# Patient Record
Sex: Male | Born: 1960 | Race: Asian | Hispanic: No | Marital: Single | Smoking: Former smoker
Health system: Southern US, Community
[De-identification: ages and names within clinical notes are randomized; demographics above are authoritative.]

## PROBLEM LIST (undated history)

## (undated) DIAGNOSIS — I10 Essential (primary) hypertension: Secondary | ICD-10-CM

## (undated) DIAGNOSIS — I639 Cerebral infarction, unspecified: Secondary | ICD-10-CM

## (undated) DIAGNOSIS — R350 Frequency of micturition: Secondary | ICD-10-CM

## (undated) HISTORY — PX: NO PAST SURGERIES: SHX2092

---

## 2015-12-03 ENCOUNTER — Other Ambulatory Visit: Payer: Self-pay | Admitting: Internal Medicine

## 2015-12-03 DIAGNOSIS — R471 Dysarthria and anarthria: Secondary | ICD-10-CM

## 2015-12-15 ENCOUNTER — Ambulatory Visit
Admission: RE | Admit: 2015-12-15 | Discharge: 2015-12-15 | Disposition: A | Discharge status: Home / Self Care | Payer: BLUE CROSS/BLUE SHIELD | Source: Ambulatory Visit | Attending: Internal Medicine | Admitting: Internal Medicine

## 2015-12-15 DIAGNOSIS — R471 Dysarthria and anarthria: Secondary | ICD-10-CM

## 2015-12-24 ENCOUNTER — Other Ambulatory Visit: Payer: Self-pay | Admitting: Gastroenterology

## 2015-12-28 ENCOUNTER — Other Ambulatory Visit: Payer: Self-pay | Admitting: Gastroenterology

## 2016-01-25 ENCOUNTER — Encounter (HOSPITAL_COMMUNITY): Admission: RE | Payer: Self-pay | Source: Ambulatory Visit

## 2016-01-25 ENCOUNTER — Ambulatory Visit (HOSPITAL_COMMUNITY)
Admission: RE | Admit: 2016-01-25 | Payer: BLUE CROSS/BLUE SHIELD | Source: Ambulatory Visit | Admitting: Gastroenterology

## 2016-01-25 SURGERY — COLONOSCOPY WITH PROPOFOL
Anesthesia: Monitor Anesthesia Care

## 2016-01-26 ENCOUNTER — Encounter (HOSPITAL_COMMUNITY): Payer: Self-pay | Admitting: *Deleted

## 2016-01-31 ENCOUNTER — Encounter (HOSPITAL_COMMUNITY): Payer: Self-pay

## 2016-01-31 ENCOUNTER — Encounter (HOSPITAL_COMMUNITY)
Admission: RE | Disposition: A | Discharge status: Home / Self Care | Payer: Self-pay | Source: Ambulatory Visit | Attending: Gastroenterology

## 2016-01-31 ENCOUNTER — Ambulatory Visit (HOSPITAL_COMMUNITY)
Admission: RE | Admit: 2016-01-31 | Discharge: 2016-01-31 | Disposition: A | Discharge status: Home / Self Care | Payer: BLUE CROSS/BLUE SHIELD | Source: Ambulatory Visit | Attending: Gastroenterology | Admitting: Gastroenterology

## 2016-01-31 ENCOUNTER — Ambulatory Visit (HOSPITAL_COMMUNITY): Payer: BLUE CROSS/BLUE SHIELD | Admitting: Anesthesiology

## 2016-01-31 DIAGNOSIS — Z1211 Encounter for screening for malignant neoplasm of colon: Secondary | ICD-10-CM | POA: Insufficient documentation

## 2016-01-31 DIAGNOSIS — I10 Essential (primary) hypertension: Secondary | ICD-10-CM | POA: Insufficient documentation

## 2016-01-31 DIAGNOSIS — F17210 Nicotine dependence, cigarettes, uncomplicated: Secondary | ICD-10-CM | POA: Diagnosis not present

## 2016-01-31 HISTORY — DX: Frequency of micturition: R35.0

## 2016-01-31 HISTORY — DX: Essential (primary) hypertension: I10

## 2016-01-31 HISTORY — PX: COLONOSCOPY WITH PROPOFOL: SHX5780

## 2016-01-31 SURGERY — COLONOSCOPY WITH PROPOFOL
Anesthesia: Monitor Anesthesia Care

## 2016-01-31 MED ORDER — PROPOFOL 10 MG/ML IV BOLUS
INTRAVENOUS | Status: AC
  Filled 2016-01-31: qty 40

## 2016-01-31 MED ORDER — SODIUM CHLORIDE 0.9 % IV SOLN: INTRAVENOUS | Status: DC

## 2016-01-31 MED ORDER — LACTATED RINGERS IV SOLN
INTRAVENOUS | Status: DC | PRN
  Administered 2016-01-31: 10:00:00 via INTRAVENOUS

## 2016-01-31 MED ORDER — PROPOFOL 500 MG/50ML IV EMUL
INTRAVENOUS | Status: DC | PRN
  Administered 2016-01-31: 150 ug/kg/min via INTRAVENOUS

## 2016-01-31 MED ORDER — LACTATED RINGERS IV SOLN
INTRAVENOUS | Status: DC
  Administered 2016-01-31: 1000 mL via INTRAVENOUS

## 2016-01-31 MED ORDER — PROPOFOL 500 MG/50ML IV EMUL
INTRAVENOUS | Status: DC | PRN
Start: 2016-01-31 — End: 2016-01-31
  Administered 2016-01-31: 40 mg via INTRAVENOUS

## 2016-01-31 SURGICAL SUPPLY — 22 items

## 2016-01-31 NOTE — Transfer of Care (Signed)
Immediate Anesthesia Transfer of Care Note  Patient: Paul [name]  Procedure(s) Performed: Procedure(s): COLONOSCOPY WITH PROPOFOL (N/A)  Patient Location: PACU  Anesthesia Type:MAC  Level of Consciousness:  sedated, patient cooperative and responds to stimulation  Airway & Oxygen Therapy:Patient Spontanous Breathing and Patient connected to face mask oxgen  Post-op Assessment:  Report given to PACU RN and Post -op Vital signs reviewed and stable  Post vital signs:  Reviewed and stable  Last Vitals:  Filed Vitals:   01/31/16 1016 01/31/16 1018  BP:  191/88  Temp: 36.6 C   Resp:  16    Complications: No apparent anesthesia complications

## 2016-01-31 NOTE — Anesthesia Postprocedure Evaluation (Signed)
Anesthesia Post Note  Patient: Paul [name]  Procedure(s) Performed: Procedure(s) (LRB): COLONOSCOPY WITH PROPOFOL (N/A)  Patient location during evaluation: Endoscopy Anesthesia Type: MAC Level of consciousness: awake and alert Pain management: pain level controlled Vital Signs Assessment: post-procedure vital signs reviewed and stable Respiratory status: spontaneous breathing, nonlabored ventilation, respiratory function stable and patient connected to nasal cannula oxygen Cardiovascular status: stable and blood pressure returned to baseline Anesthetic complications: no    Last Vitals:  Filed Vitals:   01/31/16 1121 01/31/16 1125  BP: 171/103   Pulse: 59 70  Temp:    Resp: 19 16    Last Pain: There were no vitals filed for this visit.               Marlin Jarrard,JAMES TERRILL

## 2016-01-31 NOTE — Op Note (Signed)
[facility] Patient Name: Paul [name] Procedure Date: 01/31/2016 MRN: [id number] Attending MD: Charolett Bumpers , MD Date of Birth: [birth year] CSN: [id number] Age: 55 Admit Type: Outpatient Procedure:                Colonoscopy Indications:              Screening for colorectal malignant neoplasm Providers:                Charolett Bumpers, MD, Omelia Blackwater, RN,                            Roselie Awkward, RN, Beryle Beams, Technician, Greig Right, CRNA Referring MD:              Medicines:                Propofol per Anesthesia Complications:            No immediate complications. Estimated Blood Loss:     Estimated blood loss: none. Procedure:                Pre-Anesthesia Assessment:                           - Prior to the procedure, a History and Physical                            was performed, and patient medications and                            allergies were reviewed. The patient's tolerance of                            previous anesthesia was also reviewed. The risks                            and benefits of the procedure and the sedation                            options and risks were discussed with the patient.                            All questions were answered, and informed consent                            was obtained. Prior Anticoagulants: The patient has                            taken no previous anticoagulant or antiplatelet                            agents. ASA Grade Assessment: II - A patient with  mild systemic disease. After reviewing the risks                            and benefits, the patient was deemed in                            satisfactory condition to undergo the procedure.                           After obtaining informed consent, the colonoscope                            was passed under direct vision. Throughout the                            procedure, the  patient's blood pressure, pulse, and                            oxygen saturations were monitored continuously. The                            EC-[id number] ([id number]) scope was introduced through                            the anus and advanced to the the cecum, identified                            by appendiceal orifice and ileocecal valve. The                            colonoscopy was performed without difficulty. The                            patient tolerated the procedure well. The quality                            of the bowel preparation was good. The terminal                            ileum, the ileocecal valve, the appendiceal orifice                            and the rectum were photographed. Scope In: 10:33:06 AM Scope Out: 10:51:47 AM Scope Withdrawal Time: 0 hours 13 minutes 9 seconds  Total Procedure Duration: 0 hours 18 minutes 41 seconds  Findings:      The perianal and digital rectal examinations were normal.      The entire examined colon appeared normal. Impression:               - The entire examined colon is normal.                           - No specimens collected. Moderate Sedation:      N/A- Per Anesthesia Care Recommendation:           -  Patient has a contact number available for                            emergencies. The signs and symptoms of potential                            delayed complications were discussed with the                            patient. Return to normal activities tomorrow.                            Written discharge instructions were provided to the                            patient.                           - Repeat colonoscopy in 10 years for screening                            purposes.                           - Resume previous diet.                           - Continue present medications. Procedure Code(s):        --- Professional ---                           [id number], Colonoscopy, flexible; diagnostic, including                             collection of specimen(s) by brushing or washing,                            when performed (separate procedure) Diagnosis Code(s):        --- Professional ---                           Z12.11, Encounter for screening for malignant                            neoplasm of colon CPT copyright 2016 American Medical Association. All rights reserved. The codes documented in this report are preliminary and upon coder review may  be revised to meet current compliance requirements. Danise EdgeMartin Johnson, MD Charolett BumpersMartin K Johnson, MD 01/31/2016 10:57:04 AM This report has been signed electronically. Number of Addenda: 0

## 2016-01-31 NOTE — H&P (Signed)
  Procedure: Baseline screening colonoscopy  History: The patient is a 55 year old male born 12/10/1960. He is scheduled to undergo his first screening colonoscopy with polypectomy to prevent colon cancer.  Past medical history: Hypertension. Eye surgery. Elevated PSA  Medication allergies: None  Exam: The patient is alert and lying comfortably on the endoscopy stretcher. Abdomen is soft and nontender to palpation. Lungs are clear to auscultation. Cardiac exam reveals a regular rhythm.  Plan: Proceed with screening colonoscopy

## 2016-01-31 NOTE — Anesthesia Preprocedure Evaluation (Signed)
Anesthesia Evaluation  Patient identified by MRN, date of birth, ID band Patient awake    History of Anesthesia Complications Negative for: history of anesthetic complications  Airway Mallampati: I       Dental   Pulmonary Current Smoker,    breath sounds clear to auscultation       Cardiovascular hypertension,  Rhythm:Regular Rate:Normal     Neuro/Psych    GI/Hepatic negative GI ROS, Neg liver ROS,   Endo/Other  negative endocrine ROS  Renal/GU negative Renal ROS     Musculoskeletal   Abdominal   Peds  Hematology negative hematology ROS (+)   Anesthesia Other Findings   Reproductive/Obstetrics                             Anesthesia Physical Anesthesia Plan  ASA: II  Anesthesia Plan: MAC   Post-op Pain Management:    Induction: Intravenous  Airway Management Planned: Natural Airway and Simple Face Mask  Additional Equipment:   Intra-op Plan:   Post-operative Plan:   Informed Consent: I have reviewed the patients History and Physical, chart, labs and discussed the procedure including the risks, benefits and alternatives for the proposed anesthesia with the patient or authorized representative who has indicated his/her understanding and acceptance.   Dental advisory given  Plan Discussed with: CRNA and Surgeon  Anesthesia Plan Comments:         Anesthesia Quick Evaluation

## 2016-02-01 ENCOUNTER — Encounter (HOSPITAL_COMMUNITY): Payer: Self-pay | Admitting: Gastroenterology

## 2019-03-09 ENCOUNTER — Emergency Department (HOSPITAL_COMMUNITY): Payer: PRIVATE HEALTH INSURANCE

## 2019-03-09 ENCOUNTER — Emergency Department (HOSPITAL_COMMUNITY)
Admission: EM | Admit: 2019-03-09 | Discharge: 2019-03-09 | Disposition: A | Discharge status: Home / Self Care | Payer: PRIVATE HEALTH INSURANCE | Attending: Emergency Medicine | Admitting: Emergency Medicine

## 2019-03-09 ENCOUNTER — Other Ambulatory Visit: Payer: Self-pay

## 2019-03-09 ENCOUNTER — Encounter (HOSPITAL_COMMUNITY): Payer: Self-pay

## 2019-03-09 DIAGNOSIS — U071 COVID-19: Secondary | ICD-10-CM | POA: Insufficient documentation

## 2019-03-09 DIAGNOSIS — I10 Essential (primary) hypertension: Secondary | ICD-10-CM | POA: Diagnosis not present

## 2019-03-09 DIAGNOSIS — R197 Diarrhea, unspecified: Secondary | ICD-10-CM | POA: Insufficient documentation

## 2019-03-09 DIAGNOSIS — R509 Fever, unspecified: Secondary | ICD-10-CM | POA: Diagnosis present

## 2019-03-09 DIAGNOSIS — Z79899 Other long term (current) drug therapy: Secondary | ICD-10-CM | POA: Diagnosis not present

## 2019-03-09 DIAGNOSIS — F17200 Nicotine dependence, unspecified, uncomplicated: Secondary | ICD-10-CM | POA: Insufficient documentation

## 2019-03-09 DIAGNOSIS — R05 Cough: Secondary | ICD-10-CM | POA: Diagnosis not present

## 2019-03-09 DIAGNOSIS — Z20822 Contact with and (suspected) exposure to covid-19: Secondary | ICD-10-CM

## 2019-03-09 LAB — COMPREHENSIVE METABOLIC PANEL
ALT: 31 U/L (ref 0–44)
AST: 39 U/L (ref 15–41)
Albumin: 3.8 g/dL (ref 3.5–5.0)
Alkaline Phosphatase: 49 U/L (ref 38–126)
Anion gap: 11 (ref 5–15)
BUN: 13 mg/dL (ref 6–20)
CO2: 26 mmol/L (ref 22–32)
Calcium: 8.5 mg/dL — ABNORMAL LOW (ref 8.9–10.3)
Chloride: 98 mmol/L (ref 98–111)
Creatinine, Ser: 0.86 mg/dL (ref 0.61–1.24)
GFR calc Af Amer: >60 mL/min (ref >60)
GFR calc non Af Amer: >60 mL/min (ref >60)
Glucose, Bld: 106 mg/dL — ABNORMAL HIGH (ref 70–99)
Potassium: 3.7 mmol/L (ref 3.5–5.1)
Sodium: 135 mmol/L (ref 135–145)
Total Bilirubin: 0.3 mg/dL (ref 0.3–1.2)
Total Protein: 7.8 g/dL (ref 6.5–8.1)

## 2019-03-09 LAB — URINALYSIS, ROUTINE W REFLEX MICROSCOPIC
Bacteria, UA: NONE SEEN
Bilirubin Urine: NEGATIVE
Glucose, UA: NEGATIVE mg/dL
Hgb urine dipstick: NEGATIVE
Ketones, ur: 20 mg/dL — AB
Leukocytes,Ua: NEGATIVE
Nitrite: NEGATIVE
Protein, ur: 100 mg/dL — AB
Specific Gravity, Urine: 1.014 (ref 1.005–1.030)
pH: 6 (ref 5.0–8.0)

## 2019-03-09 LAB — CBC WITH DIFFERENTIAL/PLATELET
Abs Immature Granulocytes: 0.03 10*3/uL (ref 0.00–0.07)
Basophils Absolute: 0 10*3/uL (ref 0.0–0.1)
Basophils Relative: 0 %
Eosinophils Absolute: 0 10*3/uL (ref 0.0–0.5)
Eosinophils Relative: 0 %
HCT: 48.8 % (ref 39.0–52.0)
Hemoglobin: 15.3 g/dL (ref 13.0–17.0)
Immature Granulocytes: 1 %
Lymphocytes Relative: 14 %
Lymphs Abs: 0.9 10*3/uL (ref 0.7–4.0)
MCH: 23.5 pg — ABNORMAL LOW (ref 26.0–34.0)
MCHC: 31.4 g/dL (ref 30.0–36.0)
MCV: 75 fL — ABNORMAL LOW (ref 80.0–100.0)
Monocytes Absolute: 0.6 10*3/uL (ref 0.1–1.0)
Monocytes Relative: 10 %
Neutro Abs: 4.9 10*3/uL (ref 1.7–7.7)
Neutrophils Relative %: 75 %
Platelets: 100 10*3/uL — ABNORMAL LOW (ref 150–400)
RBC: 6.51 MIL/uL — ABNORMAL HIGH (ref 4.22–5.81)
RDW: 15.6 % — ABNORMAL HIGH (ref 11.5–15.5)
WBC: 6.4 10*3/uL (ref 4.0–10.5)
nRBC: 0 % (ref 0.0–0.2)

## 2019-03-09 LAB — SARS CORONAVIRUS 2 BY RT PCR (HOSPITAL ORDER, PERFORMED IN ~~LOC~~ HOSPITAL LAB): SARS Coronavirus 2: POSITIVE — AB

## 2019-03-09 MED ORDER — ACETAMINOPHEN 325 MG PO TABS
650.0000 mg | ORAL_TABLET | Freq: Once | ORAL | Status: AC
  Administered 2019-03-09: 650 mg via ORAL
  Filled 2019-03-09: qty 2

## 2019-03-09 MED ORDER — SODIUM CHLORIDE 0.9 % IV SOLN
1000.0000 mL | INTRAVENOUS | Status: DC
  Administered 2019-03-09: 1000 mL via INTRAVENOUS

## 2019-03-09 MED ORDER — IBUPROFEN 200 MG PO TABS
400.0000 mg | ORAL_TABLET | Freq: Once | ORAL | Status: AC
  Administered 2019-03-09: 400 mg via ORAL

## 2019-03-09 NOTE — ED Notes (Signed)
Pt provided with urinal at bedside

## 2019-03-09 NOTE — ED Notes (Signed)
Bed: VA70 Expected date:  Expected time:  Means of arrival:  Comments: HOLD Neg Pressure

## 2019-03-09 NOTE — ED Provider Notes (Signed)
[facility]-EMERGENCY DEPT Provider Note   CSN: [id number] Arrival date & time: 03/09/19  1523    History   Chief Complaint Chief Complaint  Patient presents with  . Fever  . Diarrhea    HPI Paul [name] is a 58 y.o. male.     HPI Pt started having sx mostly this week.  He had some mild sx last week. He noticed a change in his smell at first.  He also lost his sense of taste. He then started having cough and fevers.  He has had diarrhea.  No shortness of breath.  No pain in the chest.    No vomiting.   He has prostate problems and is not sure he has noticed any urinary changes.    Pt is supposed to be on BP meds.  He stopped taking them when his PCP stopped taking his insurance.  Past Medical History:  Diagnosis Date  . Hypertension   . Urinary frequency     There are no active problems to display for this patient.   Past Surgical History:  Procedure Laterality Date  . COLONOSCOPY WITH PROPOFOL N/A 01/31/2016   Procedure: COLONOSCOPY WITH PROPOFOL;  Surgeon: Charolett BumpersMartin K Johnson, MD;  Location: [facility];  Service: Endoscopy;  Laterality: N/A;  . NO PAST SURGERIES          Home Medications    Prior to Admission medications   Medication Sig Start Date End Date Taking? Authorizing Provider  amLODipine-benazepril (LOTREL) 5-20 MG capsule Take 1 capsule by mouth daily. 01/02/16   [provider]  Omega-3 Fatty Acids (OMEGA 3 PO) Take 1 capsule by mouth daily.    [provider]  Polyvinyl Alcohol-Povidone (REFRESH OP) Place 1 drop into both eyes daily as needed (For dry eyes.).    [provider]  tamsulosin (FLOMAX) 0.4 MG CAPS capsule Take 0.4 mg by mouth every evening.    [provider]    Family History No family history on file.  Social History Social History   Tobacco Use  . Smoking status: Current Every Day Smoker    Packs/day: 0.50    Years: 30.00    Pack years: 15.00  . Smokeless tobacco: Never  Used  Substance Use Topics  . Alcohol use: Yes    Comment: 1-2 beers most days  . Drug use: No     Allergies   Patient has no known allergies.   Review of Systems Review of Systems  All other systems reviewed and are negative.    Physical Exam Updated Vital Signs BP (!) 153/93   Pulse 82   Temp (!) 101.7 F (38.7 C) (Oral)   Resp (!) 25   Ht 1.676 m (5\' 6" )   Wt 62 kg   SpO2 97%   BMI 22.06 kg/m   Physical Exam Vitals signs and nursing note reviewed.  Constitutional:      General: He is not in acute distress.    Appearance: He is well-developed.  HENT:     Head: Normocephalic and atraumatic.     Right Ear: External ear normal.     Left Ear: External ear normal.  Eyes:     General: No scleral icterus.       Right eye: No discharge.        Left eye: No discharge.     Conjunctiva/sclera: Conjunctivae normal.  Neck:     Musculoskeletal: Neck supple.     Trachea: No tracheal deviation.  Cardiovascular:  Rate and Rhythm: Normal rate and regular rhythm.  Pulmonary:     Effort: Pulmonary effort is normal. No respiratory distress.     Breath sounds: Normal breath sounds. No stridor. No wheezing or rales.  Abdominal:     General: Bowel sounds are normal. There is no distension.     Palpations: Abdomen is soft.     Tenderness: There is no abdominal tenderness. There is no guarding or rebound.  Musculoskeletal:        General: No tenderness.  Skin:    General: Skin is warm and dry.     Findings: No rash.  Neurological:     Mental Status: He is alert.     Cranial Nerves: No cranial nerve deficit (no facial droop, extraocular movements intact, no slurred speech).     Sensory: No sensory deficit.     Motor: No abnormal muscle tone or seizure activity.     Coordination: Coordination normal.      ED Treatments / Results  Labs (all labs ordered are listed, but only abnormal results are displayed) Labs Reviewed  CBC WITH DIFFERENTIAL/PLATELET - Abnormal;  Notable for the following components:      Result Value   RBC 6.51 (*)    MCV 75.0 (*)    MCH 23.5 (*)    RDW 15.6 (*)    Platelets 100 (*)    All other components within normal limits  COMPREHENSIVE METABOLIC PANEL - Abnormal; Notable for the following components:   Glucose, Bld 106 (*)    Calcium 8.5 (*)    All other components within normal limits  URINALYSIS, ROUTINE W REFLEX MICROSCOPIC - Abnormal; Notable for the following components:   Ketones, ur 20 (*)    Protein, ur 100 (*)    All other components within normal limits  SARS CORONAVIRUS 2 (HOSPITAL ORDER, [facility])    EKG None  Radiology Dg Chest Port 1 View  Result Date: 03/09/2019 CLINICAL DATA:  Cough fever diarrhea and abdominal pain. EXAM: PORTABLE CHEST 1 VIEW COMPARISON:  None. FINDINGS: Cardiomediastinal silhouette is normal. Mediastinal contours appear intact. Subtle peribronchial airspace consolidation in the left lower lobe. Osseous structures are without acute abnormality. Soft tissues are grossly normal. IMPRESSION: Subtle peribronchial airspace consolidation in the left lower lobe may represent early bronchopneumonia. Electronically Signed   By: Fidela Salisbury M.D.   On: 03/09/2019 16:24    Procedures Procedures (including critical care time)  Medications Ordered in ED Medications  0.9 %  sodium chloride infusion (1,000 mLs Intravenous New Bag/Given 03/09/19 1639)  acetaminophen (TYLENOL) tablet 650 mg (has no administration in time range)     Initial Impression / Assessment and Plan / ED Course  I have reviewed the triage vital signs and the nursing notes.  Pertinent labs & imaging results that were available during my care of the patient were reviewed by me and considered in my medical decision making (see chart for details).   Presented to ED with symptoms concerning for COVID-19 illness.  He described anosmia and dysgeusia.  Patient's laboratory tests are  reassuring.  Chest x-ray shows mild infiltrates consistent with a viral infection.  Patient is not tachypneic.  He has not septic.  He is oxygenating well without supplemental oxygen.  He appears stable for outpatient management.  Warning signs precautions discussed.  Callen Dougher was evaluated in Emergency Department on 03/09/2019 for the symptoms described in the history of present illness. He was evaluated in the context  of the global COVID-19 pandemic, which necessitated consideration that the patient might be at risk for infection with the SARS-CoV-2 virus that causes COVID-19. Institutional protocols and algorithms that pertain to the evaluation of patients at risk for COVID-19 are in a state of rapid change based on information released by regulatory bodies including the CDC and federal and state organizations. These policies and algorithms were followed during the patient's care in the ED.   Final Clinical Impressions(s) / ED Diagnoses   Final diagnoses:  Suspected Covid-19 Virus Infection    ED Discharge Orders    None       Linwood DibblesKnapp, Mckenleigh Tarlton, MD 03/09/19 2130

## 2019-03-09 NOTE — ED Notes (Signed)
Patient needs a note for work. Messaged provider.

## 2019-03-09 NOTE — Discharge Instructions (Addendum)
Drink plenty of fluids, take Tylenol help with your fever and discomfort, make sure to avoid contact with other people until your infection resolves, return to the emergency room as needed for worsening shortness of breath.  Your covid test should be back within the next 12 hours  Follow-up with primary care doctor to check on your blood pressure

## 2019-03-09 NOTE — ED Triage Notes (Signed)
Pt arrives POV with c/o fever, diarrhea, abd pain. Pt reports that a family member he lives with was dx with COVID-19 a week ago.

## 2019-03-09 NOTE — ED Provider Notes (Signed)
Patient discharged prior to my arrival by Dr. Tomi Bamberger. Per nursing request a note for 2 weeks from work was provided. On review of labs, SARS test listed as pending for greater than 5 hours. Per lab staff by phone, test is positive. Patient updated on results. He is comfortable with discharge home and is reminded to follow isolation instructions. Return precautions discussed.   Charlann Lange, PA-C 03/09/19 2246    Dorie Rank, MD 03/12/19 2018

## 2020-08-04 ENCOUNTER — Ambulatory Visit (INDEPENDENT_AMBULATORY_CARE_PROVIDER_SITE_OTHER): Payer: PRIVATE HEALTH INSURANCE

## 2020-08-04 ENCOUNTER — Encounter (HOSPITAL_COMMUNITY): Payer: Self-pay | Admitting: Emergency Medicine

## 2020-08-04 ENCOUNTER — Other Ambulatory Visit: Payer: Self-pay

## 2020-08-04 ENCOUNTER — Ambulatory Visit (HOSPITAL_COMMUNITY)
Admission: EM | Admit: 2020-08-04 | Discharge: 2020-08-04 | Disposition: A | Discharge status: Home / Self Care | Payer: PRIVATE HEALTH INSURANCE | Attending: Emergency Medicine | Admitting: Emergency Medicine

## 2020-08-04 DIAGNOSIS — J302 Other seasonal allergic rhinitis: Secondary | ICD-10-CM

## 2020-08-04 DIAGNOSIS — R42 Dizziness and giddiness: Secondary | ICD-10-CM | POA: Diagnosis present

## 2020-08-04 DIAGNOSIS — R059 Cough, unspecified: Secondary | ICD-10-CM | POA: Diagnosis present

## 2020-08-04 DIAGNOSIS — H7292 Unspecified perforation of tympanic membrane, left ear: Secondary | ICD-10-CM | POA: Diagnosis not present

## 2020-08-04 DIAGNOSIS — R0989 Other specified symptoms and signs involving the circulatory and respiratory systems: Secondary | ICD-10-CM | POA: Diagnosis not present

## 2020-08-04 DIAGNOSIS — F17210 Nicotine dependence, cigarettes, uncomplicated: Secondary | ICD-10-CM | POA: Diagnosis not present

## 2020-08-04 DIAGNOSIS — Z20822 Contact with and (suspected) exposure to covid-19: Secondary | ICD-10-CM | POA: Insufficient documentation

## 2020-08-04 DIAGNOSIS — I10 Essential (primary) hypertension: Secondary | ICD-10-CM | POA: Diagnosis not present

## 2020-08-04 DIAGNOSIS — Z79899 Other long term (current) drug therapy: Secondary | ICD-10-CM | POA: Diagnosis not present

## 2020-08-04 LAB — SARS CORONAVIRUS 2 (TAT 6-24 HRS): SARS Coronavirus 2: NEGATIVE

## 2020-08-04 MED ORDER — AMLODIPINE BESYLATE 5 MG PO TABS: 5.0000 mg | ORAL_TABLET | Freq: Every day | ORAL | 0 refills | Status: DC

## 2020-08-04 MED ORDER — FLUTICASONE PROPIONATE 50 MCG/ACT NA SUSP: 2.0000 | Freq: Every day | NASAL | 0 refills | Status: DC

## 2020-08-04 MED ORDER — ALBUTEROL SULFATE HFA 108 (90 BASE) MCG/ACT IN AERS: 1.0000 | INHALATION_SPRAY | RESPIRATORY_TRACT | 0 refills | Status: DC | PRN

## 2020-08-04 MED ORDER — MECLIZINE HCL 25 MG PO TABS: 25.0000 mg | ORAL_TABLET | Freq: Three times a day (TID) | ORAL | 0 refills | Status: DC | PRN

## 2020-08-04 MED ORDER — AEROCHAMBER PLUS MISC: 2 refills | Status: DC

## 2020-08-04 NOTE — ED Triage Notes (Signed)
Pt presents dizziness, cough, sneezing xs 1 week.

## 2020-08-04 NOTE — Discharge Instructions (Signed)
Decrease your salt intake. diet and exercise will lower your blood pressure significantly. It is important to keep your blood pressure under good control, as having a elevated blood pressure for prolonged periods of time significantly increases your risk of stroke, heart attacks, kidney damage, eye damage, and other problems. Measure your blood pressure once a day, preferably at the same time every day. Keep a log of this and bring it to your next doctor's appointment.  Bring your blood pressure cuff as well.  Return here in 2 weeks for blood pressure recheck if you're unable to find a primary care physician by then.  Bring a blood pressure log and your blood pressure cuff with you.  Return immediately to the ER if you start having chest pain, worsening headache, problems seeing, problems talking, problems walking, if you feel like you're about to pass out, if you do pass out, if you have a seizure, or for any other concerns.  Vertigo: Try the meclizine.  Follow-up with ENT as soon as you can.  Your dizziness could be coming from your ear.  Do not get your ear wet.  Put an ear plug in your ear when you shower or bathe.  Cough-most likely from allergies.  Your chest x-ray was negative for pneumonia.  Start saline nasal irrigation with a Lloyd Huger med rinse and distilled water as often as you want.  Claritin, Allegra, or Zyrtec, Flonase.  2 puffs from your albuterol inhaler using your spacer every 4-6 hours as needed.  Below is a list of primary care practices who are taking new patients for you to follow-up with.  [facility] internal medicine clinic Ground [facility] - [facility], [address] [place], [place], [place] [address] [phone]  [facility] at [facility] [address] [place], [place] [address] [phone]  [facility] [address] [place], [place] [address] [phone]  [facility] [phone] [address] [place] [address]  [facility]: [address] [place] [address]  [phone]  [facility]: [address] [place] [address]   [phone]  [facility]: [address] [place] [address]  [phone]  [facility] : [address] [place] [address] [phone]  [facility]: [address] [place] [address] [phone]  [facility]: [address] [place] [address] [phone]  Dr. Oneal Grout [address] [facility] [place] [address]  [phone]  Dr. Jackie Plum, [facility]. [address] [place], [place] [address]  [phone]  Go to www.goodrx.com to look up your medications. This will give you a list of where you can find your prescriptions at the most affordable prices. Or ask the pharmacist what the cash price is, or if they have any other discount programs available to help make your medication more affordable. This can be less expensive than what you would pay with insurance.

## 2020-08-04 NOTE — ED Provider Notes (Signed)
.HPI  SUBJECTIVE:  Paul [name] is a 59 y.o. male who presents with several issues: First, intermittent minutes long episodes of feeling "off balance".  States that this has been present since 11/3 when he had an episode of vertigo.  He reports nausea and diffuse minutes long headaches at the same time that he is feeling off balance and his headache resolves when the the disequilibrium resolves.  no vomiting, fevers, blurry vision, double vision, arm or leg weakness, facial droop, slurred speech, chest pain, shortness of breath, palpitations, seizures.  He states that he has a "bad left ear".  He denies new or different left ear pain.  He reports occasional tinnitus but this is not new or different than his baseline.  Occasional clear otorrhea, which he has had previously.  Denies purulent or odorous otorrhea.  He has not tried anything for this.  No alleviating factors.  Symptoms are worse when he turns his head.   He also reports 5 days of a cough, chest congestion, sinus pressure.  No fevers, chest pain, shortness of breath, wheezing, postnasal drip.  He reports itchy, watery eyes, nasal congestion and sneezing.  He has tried cough drops with improvement in his symptoms.  No aggravating factors.   He has a past medical history of Covid, hypertension, states that he ran out of medications over a year ago.  He measures his blood pressure at home, and states that his systolic can range anywhere from the 120s to 180s.  Does not remember the diastolic number.  He has a past medical history of left TM perforation, allergies which bother him around this time a year.  No history of vertigo, diabetes, chronic kidney disease, stroke, atrial fibrillation, MI.  He continues to smoke.   PMD: None.    Past Medical History:  Diagnosis Date  . Hypertension   . Urinary frequency     Past Surgical History:  Procedure Laterality Date  . COLONOSCOPY WITH PROPOFOL N/A 01/31/2016   Procedure: COLONOSCOPY WITH  PROPOFOL;  Surgeon: Charolett BumpersMartin K Johnson, MD;  Location: [facility];  Service: Endoscopy;  Laterality: N/A;  . NO PAST SURGERIES      History reviewed. No pertinent family history.  Social History   Tobacco Use  . Smoking status: Current Every Day Smoker    Packs/day: 0.50    Years: 30.00    Pack years: 15.00  . Smokeless tobacco: Never Used  Substance Use Topics  . Alcohol use: Yes    Comment: 1-2 beers most days  . Drug use: No    No current facility-administered medications for this encounter.  Current Outpatient Medications:  .  albuterol (VENTOLIN HFA) 108 (90 Base) MCG/ACT inhaler, Inhale 1-2 puffs into the lungs every 4 (four) hours as needed for wheezing or shortness of breath., Disp: 1 each, Rfl: 0 .  amLODipine (NORVASC) 5 MG tablet, Take 1 tablet (5 mg total) by mouth daily., Disp: 30 tablet, Rfl: 0 .  fluticasone (FLONASE) 50 MCG/ACT nasal spray, Place 2 sprays into both nostrils daily., Disp: 16 g, Rfl: 0 .  meclizine (ANTIVERT) 25 MG tablet, Take 1 tablet (25 mg total) by mouth 3 (three) times daily as needed for dizziness., Disp: 30 tablet, Rfl: 0 .  Omega-3 Fatty Acids (OMEGA 3 PO), Take 1 capsule by mouth daily., Disp: , Rfl:  .  Polyvinyl Alcohol-Povidone (REFRESH OP), Place 1 drop into both eyes daily as needed (For dry eyes.)., Disp: , Rfl:  .  Spacer/Aero-Holding Chambers (AEROCHAMBER PLUS)  inhaler, Use with inhaler, Disp: 1 each, Rfl: 2 .  tamsulosin (FLOMAX) 0.4 MG CAPS capsule, Take 0.4 mg by mouth every evening., Disp: , Rfl:   No Known Allergies   ROS  As noted in HPI.   Physical Exam  BP (!) 183/110 (BP Location: Right Arm)   Pulse 60   Temp 98.1 F (36.7 C) (Oral)   Resp 17   SpO2 98%   Constitutional: Well developed, well nourished, no acute distress Eyes:  EOMI, conjunctiva normal bilaterally.  PERRLA, no direct or consensual photophobia.  No nystagmus. HENT: Normocephalic, atraumatic,mucus membranes moist.  Positive nasal congestion.  No  maxillary, frontal sinus tenderness.  Right TM normal.  Large left TM perforation.  No otorrhea. Respiratory: Normal inspiratory effort, no rales, rhonchi left lobe Cardiovascular: Normal rate regular rhythm no murmurs rubs or gallops.  No carotid bruit bilaterally GI: nondistended skin: No rash, skin intact Musculoskeletal: no deformities Neurologic: Alert & oriented x 3, cranial nerves III through XII intact with the exception of an asymmetric smile the right side.  Patient states that this has been present since facial surgery.  Finger-nose, heel shin within normal limits.  Tandem gait steady.  Romberg negative.  Positive Dix-Hallpike bilaterally. Psychiatric: Speech and behavior appropriate   ED Course   Medications - No data to display  Orders Placed This Encounter  Procedures  . SARS CORONAVIRUS 2 (TAT 6-24 HRS) Nasopharyngeal Nasopharyngeal Swab    Standing Status:   Standing    Number of Occurrences:   1    Order Specific Question:   Is this test for diagnosis or screening    Answer:   Diagnosis of ill patient    Order Specific Question:   Symptomatic for COVID-19 as defined by CDC    Answer:   Yes    Order Specific Question:   Date of Symptom Onset    Answer:   07/28/2020    Order Specific Question:   Hospitalized for COVID-19    Answer:   Yes    Order Specific Question:   Admitted to ICU for COVID-19    Answer:   No    Order Specific Question:   Previously tested for COVID-19    Answer:   Yes    Order Specific Question:   Resident in a congregate (group) care setting    Answer:   No    Order Specific Question:   Employed in healthcare setting    Answer:   No    Order Specific Question:   Has patient completed COVID vaccination(s) (2 doses of Pfizer/Moderna 1 dose of [employer])    Answer:   No  . DG Chest 2 View    Standing Status:   Standing    Number of Occurrences:   1    Order Specific Question:   Reason for Exam (SYMPTOM  OR DIAGNOSIS REQUIRED)     Answer:   unlateral rales ronchi r/o PNA    No results found for this or any previous visit (from the past 24 hour(s)). DG Chest 2 View  Result Date: 08/04/2020 CLINICAL DATA:  Lung rales.  Concern for pneumonia. EXAM: CHEST - 2 VIEW COMPARISON:  03/09/2019 FINDINGS: Cardiac silhouette is normal in size. Normal mediastinal and hilar contours. Lungs are mildly hyperexpanded, but clear. No pleural effusion or pneumothorax. Skeletal structures are intact. IMPRESSION: No active cardiopulmonary disease. Electronically Signed   By: Amie Portland M.D.   On: 08/04/2020 16:03    ED  Clinical Impression  1. Vertigo   2. Seasonal allergies   3. Cough   4. Essential hypertension      ED Assessment/Plan  Reviewed imaging independently.  No pneumonia see radiology report for full details.  1.  Dizziness/vertigo.  He has no evidence of sinusitis.  This appears to be peripheral vertigo.  Could be an otitis media, but he has no fever. No Antipyretic in the past 6 hours.  Does not appear to be labyrinthitis because he has no recent history of viral infection, and he is able to ambulate.  Will send home with meclizine.  Will provide ENT information for evaluation of the perforated tympanic membrane.  2.  Cough.  Suspect allergies given itchy, watery eyes.  Chest x-ray negative for pneumonia.  Sending home with Flonase, saline nasal irrigation, antihistamine such as Claritin Allegra or Zyrtec, albuterol inhaler with a spacer.  Checking chest x-ray due to focal lung findings.  Chest x-ray negative for pneumonia.  3.  Hypertension.  States that he has been out of his medication for over a year.  He was on amlodipine/benazepril previously.  Will restart amlodipine first as I do not want to lower his blood pressure too rapidly.  Will provide primary care list for ongoing care.  He is return to clinic in 2 weeks if unable to find a primary care provider by then.  He is to bring a log of his blood pressure and his  blood pressure cuff here with him to the visit so that we confirm accuracy of home blood pressure meds cuff and adjust his medications if necessary.   Pt hypertensive today. States BP has been running in this range recently.  Pt denies any CNS type sx such as severe or worst headache of his life, visual changes, focal paresis, or new onset seizure activity. Pt denies any CV sx such as CP, dyspnea, palpitations, pedal edema, tearing pain radiating to back or abd. Pt denied any renal sx such as anuria or hematuria.  Do not think that he has hypertensive emergency at this time.  Discussed  MDM, treatment plan, and plan for follow-up with patient. Discussed sn/sx that should prompt return to the ED. patient agrees with plan.   Meds ordered this encounter  Medications  . amLODipine (NORVASC) 5 MG tablet    Sig: Take 1 tablet (5 mg total) by mouth daily.    Dispense:  30 tablet    Refill:  0  . albuterol (VENTOLIN HFA) 108 (90 Base) MCG/ACT inhaler    Sig: Inhale 1-2 puffs into the lungs every 4 (four) hours as needed for wheezing or shortness of breath.    Dispense:  1 each    Refill:  0  . Spacer/Aero-Holding Chambers (AEROCHAMBER PLUS) inhaler    Sig: Use with inhaler    Dispense:  1 each    Refill:  2    Please educate patient on use  . fluticasone (FLONASE) 50 MCG/ACT nasal spray    Sig: Place 2 sprays into both nostrils daily.    Dispense:  16 g    Refill:  0  . meclizine (ANTIVERT) 25 MG tablet    Sig: Take 1 tablet (25 mg total) by mouth 3 (three) times daily as needed for dizziness.    Dispense:  30 tablet    Refill:  0    *This clinic note was created using Scientist, clinical (histocompatibility and immunogenetics). Therefore, there may be occasional mistakes despite careful proofreading.   ?  Domenick Gong, MD 08/05/20 [phone]

## 2020-08-24 ENCOUNTER — Emergency Department (HOSPITAL_COMMUNITY): Payer: PRIVATE HEALTH INSURANCE

## 2020-08-24 ENCOUNTER — Inpatient Hospital Stay (HOSPITAL_COMMUNITY)
Admission: EM | Admit: 2020-08-24 | Discharge: 2020-08-26 | DRG: 065 | Disposition: A | Discharge status: Home / Self Care | Payer: PRIVATE HEALTH INSURANCE | Attending: Internal Medicine | Admitting: Internal Medicine

## 2020-08-24 ENCOUNTER — Encounter (HOSPITAL_COMMUNITY): Payer: Self-pay | Admitting: Emergency Medicine

## 2020-08-24 DIAGNOSIS — F1010 Alcohol abuse, uncomplicated: Secondary | ICD-10-CM | POA: Diagnosis not present

## 2020-08-24 DIAGNOSIS — R29702 NIHSS score 2: Secondary | ICD-10-CM | POA: Diagnosis present

## 2020-08-24 DIAGNOSIS — Z789 Other specified health status: Secondary | ICD-10-CM

## 2020-08-24 DIAGNOSIS — E876 Hypokalemia: Secondary | ICD-10-CM | POA: Diagnosis not present

## 2020-08-24 DIAGNOSIS — Z79899 Other long term (current) drug therapy: Secondary | ICD-10-CM | POA: Diagnosis not present

## 2020-08-24 DIAGNOSIS — G8194 Hemiplegia, unspecified affecting left nondominant side: Secondary | ICD-10-CM | POA: Diagnosis present

## 2020-08-24 DIAGNOSIS — Z87898 Personal history of other specified conditions: Secondary | ICD-10-CM

## 2020-08-24 DIAGNOSIS — I6381 Other cerebral infarction due to occlusion or stenosis of small artery: Secondary | ICD-10-CM | POA: Diagnosis not present

## 2020-08-24 DIAGNOSIS — E785 Hyperlipidemia, unspecified: Secondary | ICD-10-CM | POA: Diagnosis present

## 2020-08-24 DIAGNOSIS — J438 Other emphysema: Secondary | ICD-10-CM | POA: Diagnosis not present

## 2020-08-24 DIAGNOSIS — Z20822 Contact with and (suspected) exposure to covid-19: Secondary | ICD-10-CM | POA: Diagnosis present

## 2020-08-24 DIAGNOSIS — Z23 Encounter for immunization: Secondary | ICD-10-CM

## 2020-08-24 DIAGNOSIS — F17210 Nicotine dependence, cigarettes, uncomplicated: Secondary | ICD-10-CM | POA: Diagnosis present

## 2020-08-24 DIAGNOSIS — I10 Essential (primary) hypertension: Secondary | ICD-10-CM

## 2020-08-24 DIAGNOSIS — R531 Weakness: Secondary | ICD-10-CM

## 2020-08-24 DIAGNOSIS — Z720 Tobacco use: Secondary | ICD-10-CM

## 2020-08-24 DIAGNOSIS — N400 Enlarged prostate without lower urinary tract symptoms: Secondary | ICD-10-CM | POA: Diagnosis not present

## 2020-08-24 DIAGNOSIS — R739 Hyperglycemia, unspecified: Secondary | ICD-10-CM | POA: Diagnosis not present

## 2020-08-24 DIAGNOSIS — Z7289 Other problems related to lifestyle: Secondary | ICD-10-CM

## 2020-08-24 DIAGNOSIS — I639 Cerebral infarction, unspecified: Secondary | ICD-10-CM | POA: Diagnosis present

## 2020-08-24 DIAGNOSIS — Z87891 Personal history of nicotine dependence: Secondary | ICD-10-CM

## 2020-08-24 LAB — I-STAT CHEM 8, ED
BUN: 15 mg/dL (ref 8–23)
Calcium, Ion: 1.2 mmol/L (ref 1.15–1.40)
Chloride: 104 mmol/L (ref 98–111)
Creatinine, Ser: 0.9 mg/dL (ref 0.61–1.24)
Glucose, Bld: 226 mg/dL — ABNORMAL HIGH (ref 70–99)
HCT: 47 % (ref 39.0–52.0)
Hemoglobin: 16 g/dL (ref 13.0–17.0)
Potassium: 3.2 mmol/L — ABNORMAL LOW (ref 3.5–5.1)
Sodium: 143 mmol/L (ref 135–145)
TCO2: 27 mmol/L (ref 22–32)

## 2020-08-24 LAB — COMPREHENSIVE METABOLIC PANEL
ALT: 37 U/L (ref 0–44)
AST: 31 U/L (ref 15–41)
Albumin: 4.2 g/dL (ref 3.5–5.0)
Alkaline Phosphatase: 68 U/L (ref 38–126)
Anion gap: 11 (ref 5–15)
BUN: 15 mg/dL (ref 8–23)
CO2: 25 mmol/L (ref 22–32)
Calcium: 9.3 mg/dL (ref 8.9–10.3)
Chloride: 103 mmol/L (ref 98–111)
Creatinine, Ser: 0.87 mg/dL (ref 0.61–1.24)
GFR, Estimated: >60 mL/min (ref >60)
Glucose, Bld: 225 mg/dL — ABNORMAL HIGH (ref 70–99)
Potassium: 3.3 mmol/L — ABNORMAL LOW (ref 3.5–5.1)
Sodium: 139 mmol/L (ref 135–145)
Total Bilirubin: 0.2 mg/dL — ABNORMAL LOW (ref 0.3–1.2)
Total Protein: 7.8 g/dL (ref 6.5–8.1)

## 2020-08-24 LAB — DIFFERENTIAL
Abs Immature Granulocytes: 0.02 10*3/uL (ref 0.00–0.07)
Basophils Absolute: 0 10*3/uL (ref 0.0–0.1)
Basophils Relative: 0 %
Eosinophils Absolute: 0.1 10*3/uL (ref 0.0–0.5)
Eosinophils Relative: 1 %
Immature Granulocytes: 0 %
Lymphocytes Relative: 19 %
Lymphs Abs: 1.6 10*3/uL (ref 0.7–4.0)
Monocytes Absolute: 0.4 10*3/uL (ref 0.1–1.0)
Monocytes Relative: 4 %
Neutro Abs: 6.4 10*3/uL (ref 1.7–7.7)
Neutrophils Relative %: 76 %

## 2020-08-24 LAB — PROTIME-INR
INR: 1 (ref 0.8–1.2)
Prothrombin Time: 12.3 seconds (ref 11.4–15.2)

## 2020-08-24 LAB — CBC
HCT: 45 % (ref 39.0–52.0)
Hemoglobin: 14.1 g/dL (ref 13.0–17.0)
MCH: 23.5 pg — ABNORMAL LOW (ref 26.0–34.0)
MCHC: 31.3 g/dL (ref 30.0–36.0)
MCV: 74.9 fL — ABNORMAL LOW (ref 80.0–100.0)
Platelets: 186 10*3/uL (ref 150–400)
RBC: 6.01 MIL/uL — ABNORMAL HIGH (ref 4.22–5.81)
RDW: 16.8 % — ABNORMAL HIGH (ref 11.5–15.5)
WBC: 8.5 10*3/uL (ref 4.0–10.5)
nRBC: 0 % (ref 0.0–0.2)

## 2020-08-24 LAB — CBG MONITORING, ED: Glucose-Capillary: 222 mg/dL — ABNORMAL HIGH (ref 70–99)

## 2020-08-24 LAB — APTT: aPTT: 29 seconds (ref 24–36)

## 2020-08-24 MED ORDER — IOHEXOL 350 MG/ML SOLN
75.0000 mL | Freq: Once | INTRAVENOUS | Status: AC | PRN
  Administered 2020-08-24: 75 mL via INTRAVENOUS

## 2020-08-24 NOTE — ED Notes (Signed)
[facility] called for transport to [facility] 

## 2020-08-24 NOTE — ED Triage Notes (Signed)
Patient presents with numbness, weakness and tingling in his left arm and left leg x2 days, endorses dizziness x2 weeks. Weakness with ambulation on L side per pt.

## 2020-08-24 NOTE — ED Provider Notes (Signed)
[facility]-EMERGENCY DEPT Provider Note   CSN: [id number] Arrival date & time: 08/24/20  1755     History No chief complaint on file.   Paul [name] is a 58 y.o. male.   Weakness Severity:  Moderate Onset quality:  Gradual Duration:  2 days Timing:  Constant Progression:  Unchanged Chronicity:  New Context comment:  Unclear cause Relieved by:  Nothing Worsened by:  Nothing Ineffective treatments:  None tried Associated symptoms: difficulty walking and dizziness   Associated symptoms: no arthralgias, no chest pain, no cough, no diarrhea, no dysuria, no fever, no headaches, no nausea, no shortness of breath and no vomiting        Past Medical History:  Diagnosis Date  . Hypertension   . Urinary frequency     There are no problems to display for this patient.   Past Surgical History:  Procedure Laterality Date  . COLONOSCOPY WITH PROPOFOL N/A 01/31/2016   Procedure: COLONOSCOPY WITH PROPOFOL;  Surgeon: Charolett Bumpers, MD;  Location: [facility];  Service: Endoscopy;  Laterality: N/A;  . NO PAST SURGERIES         No family history on file.  Social History   Tobacco Use  . Smoking status: Current Every Day Smoker    Packs/day: 0.50    Years: 30.00    Pack years: 15.00  . Smokeless tobacco: Never Used  Substance Use Topics  . Alcohol use: Yes    Comment: 1-2 beers most days  . Drug use: No    Home Medications Prior to Admission medications   Medication Sig Start Date End Date Taking? Authorizing Provider  albuterol (VENTOLIN HFA) 108 (90 Base) MCG/ACT inhaler Inhale 1-2 puffs into the lungs every 4 (four) hours as needed for wheezing or shortness of breath. 08/04/20   Domenick Gong, MD  amLODipine (NORVASC) 5 MG tablet Take 1 tablet (5 mg total) by mouth daily. 08/04/20 09/03/20  Domenick Gong, MD  fluticasone (FLONASE) 50 MCG/ACT nasal spray Place 2 sprays into both nostrils daily. 08/04/20   Domenick Gong, MD   meclizine (ANTIVERT) 25 MG tablet Take 1 tablet (25 mg total) by mouth 3 (three) times daily as needed for dizziness. 08/04/20   Domenick Gong, MD  Omega-3 Fatty Acids (OMEGA 3 PO) Take 1 capsule by mouth daily.    [provider]  Polyvinyl Alcohol-Povidone (REFRESH OP) Place 1 drop into both eyes daily as needed (For dry eyes.).    [provider]  Spacer/Aero-Holding Chambers (AEROCHAMBER PLUS) inhaler Use with inhaler 08/04/20   Domenick Gong, MD  tamsulosin (FLOMAX) 0.4 MG CAPS capsule Take 0.4 mg by mouth every evening.    [provider]  amLODipine-benazepril (LOTREL) 5-20 MG capsule Take 1 capsule by mouth daily. 01/02/16 08/04/20  [provider]    Allergies    Patient has no known allergies.  Review of Systems   Review of Systems  Constitutional: Negative for chills and fever.  HENT: Negative for congestion and rhinorrhea.   Respiratory: Negative for cough and shortness of breath.   Cardiovascular: Negative for chest pain and palpitations.  Gastrointestinal: Negative for diarrhea, nausea and vomiting.  Genitourinary: Negative for difficulty urinating and dysuria.  Musculoskeletal: Negative for arthralgias and back pain.  Skin: Negative for color change and rash.  Neurological: Positive for dizziness and weakness. Negative for light-headedness and headaches.    Physical Exam Updated Vital Signs BP (!) 141/95   Pulse 68   Temp 98.1 F (36.7 C) (Oral)  Resp 16   SpO2 98%   Physical Exam Vitals and nursing note reviewed.  Constitutional:      General: He is not in acute distress.    Appearance: Normal appearance.  HENT:     Head: Normocephalic and atraumatic.     Nose: No rhinorrhea.  Eyes:     General:        Right eye: No discharge.        Left eye: No discharge.     Conjunctiva/sclera: Conjunctivae normal.  Cardiovascular:     Rate and Rhythm: Normal rate and regular rhythm.  Pulmonary:     Effort: Pulmonary  effort is normal.     Breath sounds: No stridor.  Abdominal:     General: Abdomen is flat. There is no distension.     Palpations: Abdomen is soft.  Musculoskeletal:        General: No deformity or signs of injury.  Skin:    General: Skin is warm and dry.  Neurological:     General: No focal deficit present.     Mental Status: He is alert.     Motor: Weakness present.     Comments: 4/5 grip strength in left upper extremity, 4-5 strength in hip flexion on the left.  No pronator drift, no dysmetria, cranial nerves without deficit, no facial droop, alert and oriented x4, sensation intact throughout, no nystagmus, able to fix on objects with eyes while head is turning.  Psychiatric:        Mood and Affect: Mood normal.        Behavior: Behavior normal.        Thought Content: Thought content normal.     ED Results / Procedures / Treatments   Labs (all labs ordered are listed, but only abnormal results are displayed) Labs Reviewed  CBC - Abnormal; Notable for the following components:      Result Value   RBC 6.01 (*)    MCV 74.9 (*)    MCH 23.5 (*)    RDW 16.8 (*)    All other components within normal limits  COMPREHENSIVE METABOLIC PANEL - Abnormal; Notable for the following components:   Potassium 3.3 (*)    Glucose, Bld 225 (*)    Total Bilirubin 0.2 (*)    All other components within normal limits  I-STAT CHEM 8, ED - Abnormal; Notable for the following components:   Potassium 3.2 (*)    Glucose, Bld 226 (*)    All other components within normal limits  CBG MONITORING, ED - Abnormal; Notable for the following components:   Glucose-Capillary 222 (*)    All other components within normal limits  RESP PANEL BY RT-PCR (FLU A&B, COVID) ARPGX2  PROTIME-INR  APTT  DIFFERENTIAL    EKG EKG Interpretation  Date/Time:  Tuesday August 24 2020 18:06:24 EST Ventricular Rate:  83 PR Interval:    QRS Duration: 95 QT Interval:  357 QTC Calculation: 420 R Axis:   83 Text  Interpretation: Sinus rhythm Borderline right axis deviation Left ventricular hypertrophy Abnormal T, consider ischemia, inferior leads ST elevation, consider anterior injury 12 Lead; [place]-L[place]ar Confirmed by Marianna Fuss ([id number]) on 08/24/2020 8:54:16 PM Also confirmed by Cherlynn Perches [phone])  on 08/24/2020 9:12:51 PM   Radiology CT Angio Head W or Wo Contrast  Result Date: 08/24/2020 CLINICAL DATA:  59 year old male with persistent, recurrent dizziness. Weakness and tingling in the left extremities for 2 days. EXAM: CT ANGIOGRAPHY HEAD AND NECK TECHNIQUE: Multidetector CT  imaging of the head and neck was performed using the standard protocol during bolus administration of intravenous contrast. Multiplanar CT image reconstructions and MIPs were obtained to evaluate the vascular anatomy. Carotid stenosis measurements (when applicable) are obtained utilizing NASCET criteria, using the distal internal carotid diameter as the denominator. CONTRAST:  75mL OMNIPAQUE IOHEXOL 350 MG/ML SOLN COMPARISON:  Brain MRI 12/15/2015. FINDINGS: CT HEAD Brain: Mild Calcified atherosclerosis at the skull base. Cerebral volume is stable since 2017, normal for age. Scattered bilateral white matter hypodensity appears similar to the abnormal FLAIR signal in 2017. Superimposed dystrophic calcifications at the bilateral basal ganglia. No midline shift, ventriculomegaly, mass effect, evidence of mass lesion, intracranial hemorrhage or evidence of cortically based acute infarction. No cortical encephalomalacia identified. Calvarium and skull base: No acute osseous abnormality identified. Paranasal sinuses: Chronic left mastoid effusion, coalescence. Other Visualized paranasal sinuses and mastoids are stable and well pneumatized. Orbits: Visualized orbits and scalp soft tissues are within normal limits. CTA NECK Skeleton: No acute osseous abnormality identified. Lower cervical disc and endplate degeneration including vacuum disc at  C6-C7. Upper chest: Paraseptal emphysema in the right lung apex. Other neck: Negative. Aortic arch: 3 vessel arch configuration. Minor arch atherosclerosis. Right carotid system: Negative brachiocephalic artery. Mildly tortuous proximal right CCA. Mild soft and calcified plaque at the right ICA origin and bulb. Tortuous right ICA below the skull base without stenosis. Left carotid system: Negative left CCA origin. Mild to moderate mostly calcified plaque at the posterior left ICA origin without stenosis. Tortuous left ICA distal to the bulb. Vertebral arteries: Negative proximal right subclavian artery and right vertebral artery origin. Dominant appearing right vertebral artery with intermittent tortuosity is widely patent to the skull base. Left vertebral artery has an early origin from the subclavian without stenosis. Non dominant left vertebral has a slightly late entry to the cervical transverse foramen and is normal to the skull base without stenosis. CTA HEAD Posterior circulation: Tortuous distal vertebral arteries, the right is dominant. Minimal V4 segment calcified plaque without stenosis. Normal PICA origins. Patent, tortuous basilar artery without stenosis. Patent SCA and PCA origins. Tortuous left P1 segment. Posterior communicating arteries are diminutive or absent. Bilateral PCA branches are within normal limits. Anterior circulation: Both ICA siphons are patent. Mild supraclinoid calcified plaque greater on the left. No siphon stenosis. Normal ophthalmic artery origins. Patent carotid termini, MCA and ACA origins. Tortuous ACAs. Normal anterior communicating artery. Median artery of the corpus callosum (normal variant). Left MCA M1 segment and bifurcation are patent without stenosis. Left MCA branches are normal aside from mild tortuosity. Right MCA M1 segment is tortuous. Patent right MCA bifurcation without stenosis. Right MCA branches are within normal limits. Venous sinuses: Patent. Anatomic  variants: Dominant right vertebral artery. Early origin of the left vertebral from the subclavian. Review of the MIP images confirms the above findings IMPRESSION: 1. Negative for large vessel occlusion. 2. Generalized arterial tortuosity in the head and neck. Mild for age atherosclerosis with no significant arterial stenosis. 3. Chronically advanced but nonspecific cerebral white matter changes, similar to a 2017 MRI. No acute intracranial abnormality. 4. Sequelae of remote left mastoid inflammation appears stable since 2017. Left tympanic cavity appears clear. 5. Emphysema (ICD10-J43.9). Electronically Signed   By: Odessa FlemingH  Hall M.D.   On: 08/24/2020 22:24   CT Angio Neck W and/or Wo Contrast  Result Date: 08/24/2020 CLINICAL DATA:  59 year old male with persistent, recurrent dizziness. Weakness and tingling in the left extremities for 2 days. EXAM: CT ANGIOGRAPHY  HEAD AND NECK TECHNIQUE: Multidetector CT imaging of the head and neck was performed using the standard protocol during bolus administration of intravenous contrast. Multiplanar CT image reconstructions and MIPs were obtained to evaluate the vascular anatomy. Carotid stenosis measurements (when applicable) are obtained utilizing NASCET criteria, using the distal internal carotid diameter as the denominator. CONTRAST:  75mL OMNIPAQUE IOHEXOL 350 MG/ML SOLN COMPARISON:  Brain MRI 12/15/2015. FINDINGS: CT HEAD Brain: Mild Calcified atherosclerosis at the skull base. Cerebral volume is stable since 2017, normal for age. Scattered bilateral white matter hypodensity appears similar to the abnormal FLAIR signal in 2017. Superimposed dystrophic calcifications at the bilateral basal ganglia. No midline shift, ventriculomegaly, mass effect, evidence of mass lesion, intracranial hemorrhage or evidence of cortically based acute infarction. No cortical encephalomalacia identified. Calvarium and skull base: No acute osseous abnormality identified. Paranasal sinuses:  Chronic left mastoid effusion, coalescence. Other Visualized paranasal sinuses and mastoids are stable and well pneumatized. Orbits: Visualized orbits and scalp soft tissues are within normal limits. CTA NECK Skeleton: No acute osseous abnormality identified. Lower cervical disc and endplate degeneration including vacuum disc at C6-C7. Upper chest: Paraseptal emphysema in the right lung apex. Other neck: Negative. Aortic arch: 3 vessel arch configuration. Minor arch atherosclerosis. Right carotid system: Negative brachiocephalic artery. Mildly tortuous proximal right CCA. Mild soft and calcified plaque at the right ICA origin and bulb. Tortuous right ICA below the skull base without stenosis. Left carotid system: Negative left CCA origin. Mild to moderate mostly calcified plaque at the posterior left ICA origin without stenosis. Tortuous left ICA distal to the bulb. Vertebral arteries: Negative proximal right subclavian artery and right vertebral artery origin. Dominant appearing right vertebral artery with intermittent tortuosity is widely patent to the skull base. Left vertebral artery has an early origin from the subclavian without stenosis. Non dominant left vertebral has a slightly late entry to the cervical transverse foramen and is normal to the skull base without stenosis. CTA HEAD Posterior circulation: Tortuous distal vertebral arteries, the right is dominant. Minimal V4 segment calcified plaque without stenosis. Normal PICA origins. Patent, tortuous basilar artery without stenosis. Patent SCA and PCA origins. Tortuous left P1 segment. Posterior communicating arteries are diminutive or absent. Bilateral PCA branches are within normal limits. Anterior circulation: Both ICA siphons are patent. Mild supraclinoid calcified plaque greater on the left. No siphon stenosis. Normal ophthalmic artery origins. Patent carotid termini, MCA and ACA origins. Tortuous ACAs. Normal anterior communicating artery. Median  artery of the corpus callosum (normal variant). Left MCA M1 segment and bifurcation are patent without stenosis. Left MCA branches are normal aside from mild tortuosity. Right MCA M1 segment is tortuous. Patent right MCA bifurcation without stenosis. Right MCA branches are within normal limits. Venous sinuses: Patent. Anatomic variants: Dominant right vertebral artery. Early origin of the left vertebral from the subclavian. Review of the MIP images confirms the above findings IMPRESSION: 1. Negative for large vessel occlusion. 2. Generalized arterial tortuosity in the head and neck. Mild for age atherosclerosis with no significant arterial stenosis. 3. Chronically advanced but nonspecific cerebral white matter changes, similar to a 2017 MRI. No acute intracranial abnormality. 4. Sequelae of remote left mastoid inflammation appears stable since 2017. Left tympanic cavity appears clear. 5. Emphysema (ICD10-J43.9). Electronically Signed   By: Odessa Fleming M.D.   On: 08/24/2020 22:24    Procedures Procedures (including critical care time)  Medications Ordered in ED Medications  iohexol (OMNIPAQUE) 350 MG/ML injection 75 mL (75 mLs Intravenous Contrast Given 08/24/20  2159)    ED Course  I have reviewed the triage vital signs and the nursing notes.  Pertinent labs & imaging results that were available during my care of the patient were reviewed by me and considered in my medical decision making (see chart for details).    MDM Rules/Calculators/A&P                          Patient's had intermittent dizziness over the last month happens with head position changes, comes in today with weakness in his left upper and left lower extremity, right now no dizziness but has it with ambulation and certain head movements.  CTA of the head neck reviewed by radiology shows no acute abnormalities.  Other laboratory studies are unremarkable, patient needs MRI and likely neurology follow-up.  Plus or minus admission for  acute stroke.  MRI is not available at our facility, the patient will need transfer to [facility] main campus for MRI and neurology consultation.  Pt care was handed off to on coming provider at 2330.  Complete history and physical and current plan have been communicated.  Please refer to their note for the remainder of ED care and ultimate disposition.  Pt seen in conjunction with Dr. Oletta Cohn   Final Clinical Impression(s) / ED Diagnoses Final diagnoses:  Left-sided weakness    Rx / DC Orders ED Discharge Orders    None       Sabino Donovan, MD 08/24/20 2322

## 2020-08-25 ENCOUNTER — Encounter (HOSPITAL_COMMUNITY): Payer: Self-pay | Admitting: Internal Medicine

## 2020-08-25 ENCOUNTER — Emergency Department (HOSPITAL_COMMUNITY): Payer: PRIVATE HEALTH INSURANCE

## 2020-08-25 ENCOUNTER — Other Ambulatory Visit: Payer: Self-pay

## 2020-08-25 ENCOUNTER — Inpatient Hospital Stay (HOSPITAL_COMMUNITY): Payer: PRIVATE HEALTH INSURANCE

## 2020-08-25 DIAGNOSIS — Z87898 Personal history of other specified conditions: Secondary | ICD-10-CM

## 2020-08-25 DIAGNOSIS — I10 Essential (primary) hypertension: Secondary | ICD-10-CM

## 2020-08-25 DIAGNOSIS — Z720 Tobacco use: Secondary | ICD-10-CM

## 2020-08-25 DIAGNOSIS — Z7289 Other problems related to lifestyle: Secondary | ICD-10-CM | POA: Diagnosis not present

## 2020-08-25 DIAGNOSIS — I639 Cerebral infarction, unspecified: Secondary | ICD-10-CM | POA: Diagnosis not present

## 2020-08-25 DIAGNOSIS — R29702 NIHSS score 2: Secondary | ICD-10-CM | POA: Diagnosis present

## 2020-08-25 DIAGNOSIS — F17210 Nicotine dependence, cigarettes, uncomplicated: Secondary | ICD-10-CM | POA: Diagnosis present

## 2020-08-25 DIAGNOSIS — G8194 Hemiplegia, unspecified affecting left nondominant side: Secondary | ICD-10-CM | POA: Diagnosis present

## 2020-08-25 DIAGNOSIS — R739 Hyperglycemia, unspecified: Secondary | ICD-10-CM | POA: Diagnosis present

## 2020-08-25 DIAGNOSIS — Z87891 Personal history of nicotine dependence: Secondary | ICD-10-CM

## 2020-08-25 DIAGNOSIS — E785 Hyperlipidemia, unspecified: Secondary | ICD-10-CM | POA: Diagnosis present

## 2020-08-25 DIAGNOSIS — Z789 Other specified health status: Secondary | ICD-10-CM

## 2020-08-25 DIAGNOSIS — N400 Enlarged prostate without lower urinary tract symptoms: Secondary | ICD-10-CM | POA: Diagnosis present

## 2020-08-25 DIAGNOSIS — R531 Weakness: Secondary | ICD-10-CM | POA: Diagnosis present

## 2020-08-25 DIAGNOSIS — I6389 Other cerebral infarction: Secondary | ICD-10-CM

## 2020-08-25 DIAGNOSIS — J438 Other emphysema: Secondary | ICD-10-CM | POA: Diagnosis present

## 2020-08-25 DIAGNOSIS — I6930 Unspecified sequelae of cerebral infarction: Secondary | ICD-10-CM

## 2020-08-25 DIAGNOSIS — F1010 Alcohol abuse, uncomplicated: Secondary | ICD-10-CM | POA: Diagnosis present

## 2020-08-25 DIAGNOSIS — E876 Hypokalemia: Secondary | ICD-10-CM | POA: Diagnosis present

## 2020-08-25 DIAGNOSIS — Z20822 Contact with and (suspected) exposure to covid-19: Secondary | ICD-10-CM | POA: Diagnosis present

## 2020-08-25 DIAGNOSIS — I6381 Other cerebral infarction due to occlusion or stenosis of small artery: Secondary | ICD-10-CM | POA: Diagnosis present

## 2020-08-25 DIAGNOSIS — Z79899 Other long term (current) drug therapy: Secondary | ICD-10-CM | POA: Diagnosis not present

## 2020-08-25 DIAGNOSIS — Z23 Encounter for immunization: Secondary | ICD-10-CM | POA: Diagnosis not present

## 2020-08-25 HISTORY — DX: Cerebral infarction, unspecified: I63.9

## 2020-08-25 LAB — GLUCOSE, CAPILLARY: Glucose-Capillary: 190 mg/dL — ABNORMAL HIGH (ref 70–99)

## 2020-08-25 LAB — RESP PANEL BY RT-PCR (FLU A&B, COVID) ARPGX2
Influenza A by PCR: NEGATIVE
Influenza B by PCR: NEGATIVE
SARS Coronavirus 2 by RT PCR: NEGATIVE

## 2020-08-25 LAB — PHOSPHORUS: Phosphorus: 3.1 mg/dL (ref 2.5–4.6)

## 2020-08-25 LAB — ECHOCARDIOGRAM COMPLETE
Area-P 1/2: 2.01 cm2
S' Lateral: 3.4 cm

## 2020-08-25 LAB — TROPONIN I (HIGH SENSITIVITY): Troponin I (High Sensitivity): 7 ng/L (ref <18)

## 2020-08-25 LAB — HIV ANTIBODY (ROUTINE TESTING W REFLEX): HIV Screen 4th Generation wRfx: NONREACTIVE

## 2020-08-25 LAB — MAGNESIUM: Magnesium: 1.9 mg/dL (ref 1.7–2.4)

## 2020-08-25 MED ORDER — STROKE: EARLY STAGES OF RECOVERY BOOK
Freq: Once | Status: AC
  Filled 2020-08-25: qty 1

## 2020-08-25 MED ORDER — LORAZEPAM 2 MG/ML IJ SOLN: 1.0000 mg | INTRAMUSCULAR | Status: DC | PRN

## 2020-08-25 MED ORDER — INFLUENZA VAC SPLIT QUAD 0.5 ML IM SUSY
0.5000 mL | PREFILLED_SYRINGE | INTRAMUSCULAR | Status: AC
  Administered 2020-08-26: 0.5 mL via INTRAMUSCULAR
  Filled 2020-08-25: qty 0.5

## 2020-08-25 MED ORDER — CLOPIDOGREL BISULFATE 75 MG PO TABS
75.0000 mg | ORAL_TABLET | Freq: Every day | ORAL | Status: DC
  Administered 2020-08-25 – 2020-08-26 (×2): 75 mg via ORAL
  Filled 2020-08-25 (×2): qty 1

## 2020-08-25 MED ORDER — INSULIN ASPART 100 UNIT/ML ~~LOC~~ SOLN
0.0000 [IU] | Freq: Three times a day (TID) | SUBCUTANEOUS | Status: DC
  Administered 2020-08-26: 1 [IU] via SUBCUTANEOUS

## 2020-08-25 MED ORDER — PNEUMOCOCCAL VAC POLYVALENT 25 MCG/0.5ML IJ INJ
0.5000 mL | INJECTION | INTRAMUSCULAR | Status: AC
  Administered 2020-08-26: 0.5 mL via INTRAMUSCULAR
  Filled 2020-08-25: qty 0.5

## 2020-08-25 MED ORDER — FOLIC ACID 1 MG PO TABS
1.0000 mg | ORAL_TABLET | Freq: Every day | ORAL | Status: DC
  Administered 2020-08-25 – 2020-08-26 (×2): 1 mg via ORAL
  Filled 2020-08-25 (×2): qty 1

## 2020-08-25 MED ORDER — POTASSIUM CHLORIDE CRYS ER 20 MEQ PO TBCR
40.0000 meq | EXTENDED_RELEASE_TABLET | Freq: Once | ORAL | Status: AC
  Administered 2020-08-25: 40 meq via ORAL
  Filled 2020-08-25: qty 2

## 2020-08-25 MED ORDER — ASPIRIN EC 81 MG PO TBEC
81.0000 mg | DELAYED_RELEASE_TABLET | Freq: Every day | ORAL | Status: DC
  Administered 2020-08-25 – 2020-08-26 (×2): 81 mg via ORAL
  Filled 2020-08-25 (×2): qty 1

## 2020-08-25 MED ORDER — ACETAMINOPHEN 650 MG RE SUPP: 650.0000 mg | RECTAL | Status: DC | PRN

## 2020-08-25 MED ORDER — ATORVASTATIN CALCIUM 40 MG PO TABS: 40.0000 mg | ORAL_TABLET | Freq: Every day | ORAL | Status: DC

## 2020-08-25 MED ORDER — ACETAMINOPHEN 160 MG/5ML PO SOLN: 650.0000 mg | ORAL | Status: DC | PRN

## 2020-08-25 MED ORDER — LORAZEPAM 1 MG PO TABS: 1.0000 mg | ORAL_TABLET | ORAL | Status: DC | PRN

## 2020-08-25 MED ORDER — INSULIN ASPART 100 UNIT/ML ~~LOC~~ SOLN: 0.0000 [IU] | Freq: Every day | SUBCUTANEOUS | Status: DC

## 2020-08-25 MED ORDER — ADULT MULTIVITAMIN W/MINERALS CH
1.0000 | ORAL_TABLET | Freq: Every day | ORAL | Status: DC
  Administered 2020-08-25 – 2020-08-26 (×2): 1 via ORAL
  Filled 2020-08-25 (×2): qty 1

## 2020-08-25 MED ORDER — NICOTINE 14 MG/24HR TD PT24
14.0000 mg | MEDICATED_PATCH | Freq: Every day | TRANSDERMAL | Status: DC
  Administered 2020-08-25 – 2020-08-26 (×2): 14 mg via TRANSDERMAL
  Filled 2020-08-25 (×2): qty 1

## 2020-08-25 MED ORDER — THIAMINE HCL 100 MG/ML IJ SOLN: 100.0000 mg | Freq: Every day | INTRAMUSCULAR | Status: DC

## 2020-08-25 MED ORDER — ATORVASTATIN CALCIUM 80 MG PO TABS
80.0000 mg | ORAL_TABLET | Freq: Every day | ORAL | Status: DC
  Administered 2020-08-25 – 2020-08-26 (×2): 80 mg via ORAL
  Filled 2020-08-25 (×2): qty 1

## 2020-08-25 MED ORDER — ACETAMINOPHEN 325 MG PO TABS: 650.0000 mg | ORAL_TABLET | ORAL | Status: DC | PRN

## 2020-08-25 MED ORDER — THIAMINE HCL 100 MG PO TABS
100.0000 mg | ORAL_TABLET | Freq: Every day | ORAL | Status: DC
  Administered 2020-08-25 – 2020-08-26 (×2): 100 mg via ORAL
  Filled 2020-08-25 (×2): qty 1

## 2020-08-25 NOTE — Progress Notes (Signed)
STROKE TEAM PROGRESS NOTE   HISTORY OF PRESENT ILLNESS (per record) Paul [name] is a 59 y.o. male BPH, tobacco use, alcohol use and HTN with complaints ofdizziness and left-sided weakness. Patient reports 1 month ago started to feel dizziness/lightheadedness and nausea which was episodic. He then developed persistent feeling "off balance"and unsteadiness when attempting to walk as if being pulled to his left side. Over the past 4 days he noticed left arm and leg are weakness and numbness in fingers of both his hands.  He bought aspirin 81mg  started taking 2 days ago.  States "I don'ttake medicines unless I have to." Denies history of prior stroke.  Home blood pressures range from 140s - 190s Denies fevers, cough, shortness of breath, chest pain, vomiting, abdominal pain, or diarrhea. LKW: as above tpa given?: No, OSW IR Thrombectomy? No lvo Modified Rankin Scale: 0-Completely asymptomatic and back to baseline post- stroke NIHSS: 2 face and sensory    INTERVAL HISTORY I have personally reviewed history of presenting illness, electronic medical records and available imaging films in PACS. He presented with dizziness and imbalance for a month and left-sided weakness and numbness for the last 3 to 4 days. MRI scan of the brain shows right pontine as well as corona radiata lacunar infarct as well as several areas of chronic microhemorrhages on SWI images. CT angiogram of the brain and neck does not show any large vessel stenosis or occlusion. Echocardiogram and lipid profile and A1c are pending.    OBJECTIVE Vitals:   08/25/20 0530 08/25/20 0600 08/25/20 0630 08/25/20 0645  BP: (!) 139/91 (!) 141/92 (!) 144/98 137/86  Pulse: (!) 58 (!) 56 62 (!) 58  Resp: 14 15 13 14   Temp:      TempSrc:      SpO2: 95% 93% 96% 93%    CBC:  Recent Labs  Lab 08/24/20 1820 08/24/20 1828  WBC 8.5  --   NEUTROABS 6.4  --   HGB 14.1 16.0  HCT 45.0 47.0  MCV 74.9*  --   PLT 186  --     Basic  Metabolic Panel:  Recent Labs  Lab 08/24/20 1820 08/24/20 1828 08/25/20 0608  NA 139 143  --   K 3.3* 3.2*  --   CL 103 104  --   CO2 25  --   --   GLUCOSE 225* 226*  --   BUN 15 15  --   CREATININE 0.87 0.90  --   CALCIUM 9.3  --   --   MG  --   --  1.9  PHOS  --   --  3.1    Lipid Panel: No results found for: CHOL, TRIG, HDL, CHOLHDL, VLDL, LDLCALC HgbA1c: No results found for: HGBA1C Urine Drug Screen: No results found for: LABOPIA, COCAINSCRNUR, LABBENZ, AMPHETMU, THCU, LABBARB  Alcohol Level No results found for: ETH  IMAGING  CT Angio Head W or Wo Contrast  CT Angio Neck W and/or Wo Contrast 08/24/2020 IMPRESSION:  1. Negative for large vessel occlusion.  2. Generalized arterial tortuosity in the head and neck. Mild for age atherosclerosis with no significant arterial stenosis.  3. Chronically advanced but nonspecific cerebral white matter changes, similar to a 2017 MRI. No acute intracranial abnormality.  4. Sequelae of remote left mastoid inflammation appears stable since 2017. Left tympanic cavity appears clear.  5. Emphysema (ICD10-J43.9).   MR BRAIN WO CONTRAST 08/25/2020 IMPRESSION:  1. 1 cm acute ischemic infarct involving the right paramedian pons. Suspected  associated faint petechial hemorrhage without frank hemorrhagic transformation or significant regional mass effect.  2. Additional punctate acute to subacute ischemic infarct involving the posterior right parietal centrum semi ovale.  3. Underlying age-related cerebral atrophy with moderate chronic microvascular ischemic disease, with multiple scattered remote lacunar infarcts about the bilateral basal ganglia and hemispheric cerebral white matter.  4. Multiple scattered chronic micro hemorrhages involving the supratentorial brain, favored to be related to poorly controlled hypertension. Cerebral amyloid angiopathy could also be considered, although is felt to be less likely.   Transthoracic Echocardiogram   00/00/2021 Pending  ECG - SR rate 83 BPM. (See cardiology reading for complete details)   PHYSICAL EXAM Blood pressure 137/86, pulse (!) 58, temperature 99.2 F (37.3 C), temperature source Oral, resp. rate 14, SpO2 93 %. Frail middle-age Asian male not in distress. . Afebrile. Head is nontraumatic. Neck is supple without bruit.    Cardiac exam no murmur or gallop. Lungs are clear to auscultation. Distal pulses are well felt.  Neurological Exam ; Awake alert oriented x 3 normal speech and language. Mild left lower face asymmetry. Tongue midline. Mild left upper and lower extremity drift. Mild diminished fine finger movements on left. Orbits right over left upper extremity. Mild left grip and hip flexor weakness.. Normal sensation . Normal coordination.      ASSESSMENT/PLAN [name] is a 59 y.o. male with history of BPH, tobacco use, alcohol use and HTN with complaints ofdizziness and left-sided weakness. He did not receive IV t-PA due to late presentation (>4.5 hours from time of onset).  Stroke: acute ischemic infarct involving the right paramedian pons with punctate acute to subacute ischemic infarct involving the posterior right parietal centrum semi ovale - embolic vs small vessel disease.   Resultant left hemiparesis  Code Stroke CT Head - not ordered  CT head - not ordered  MRI head - 1 cm acute ischemic infarct involving the right paramedian pons. Suspected associated faint petechial hemorrhage without frank hemorrhagic transformation or significant regional mass effect. Additional punctate acute to subacute ischemic infarct involving the posterior right parietal centrum semi ovale. Underlying age-related cerebral atrophy with moderate chronic microvascular ischemic disease, with multiple scattered remote lacunar infarcts about the bilateral basal ganglia and hemispheric cerebral white matter. Multiple scattered chronic micro hemorrhages involving the supratentorial  brain, favored to be related to poorly controlled hypertension. Cerebral amyloid angiopathy could also be considered, although is felt to be less likely.   MRA head - not ordered  CTA H&N - Negative for large vessel occlusion. Generalized arterial tortuosity in the head and neck. Mild for age atherosclerosis with no significant arterial stenosis. Chronically advanced but nonspecific cerebral white matter changes, similar to a 2017 MRI. No acute intracranial abnormality.  CT Perfusion - not ordered  Carotid Doppler - CTA neck ordered - carotid dopplers not indicated.  2D Echo - pending  Loyal Jacobson Virus 2 - negative  LDL - pending  HgbA1c - pending  UDS - not ordered  VTE prophylaxis - SCDs Diet  Diet Order            Diet NPO time specified  Diet effective now                 No antithrombotic prior to admission, now on No antithrombotic  Patient will be counseled to be compliant with his antithrombotic medications  Ongoing aggressive stroke risk factor management  Therapy recommendations:  pending  Disposition:  Pending  Hypertension  Home BP  meds: Norvasc  Current BP meds: none   Stable . Permissive hypertension (OK if < 220/120) but gradually normalize in 5-7 days  . Long-term BP goal normotensive  Hyperlipidemia  Home Lipid lowering medication: none   LDL pending, goal < 70  Current lipid lowering medication: Lipitor 80 mg daily   Continue statin at discharge  Diabetes  Home diabetic meds: none   Current diabetic meds: none  HgbA1c pending, goal < 7.0 Recent Labs    08/24/20 1809  GLUCAP 222*    Other Stroke Risk Factors  Advanced age  Cigarette smoker - advised to stop smoking  ETOH use, advised to drink no more than 1 alcoholic beverage per day.  Other Active Problems  Code status - Full code  NPO   Emphysema ([id number])  Hypokalemia - potassium - 3.2 - supplement ordered  Hyperglycemia - await HgbA1C  Mild  bradycardia - TSH recommended by Dr Thomasena Edis  ETOH hx - thiamine ; Ativan ; folic acid  Hospital day # 0 He presented with subacute symptoms of dizziness as well as left hemiparesis due to right pontine and subcortical lacunar infarct from small vessel disease. Recommend aspirin Plavix for 3 weeks followed by aspirin alone and aggressive risk factor modification. Mobilize out of bed. Therapy and rehab consults. Greater than 50% time during this 35-minute visit was spent in counseling and coordination of care about his lacunar stroke and discussion about stroke prevention and treatment and discussion with care team. Discussed with Dr. Patrina Levering, MD  To contact Stroke Continuity provider, please refer to [email or web address]. After hours, contact General Neurology

## 2020-08-25 NOTE — ED Provider Notes (Signed)
3:10 AM Patient care assumed in transfer from MD [facility].  Presenting to [facility] from [facility] ED for further evaluation of left-sided weakness.  Patient states he noticed this weakness yesterday (08/24/20) when he tried to lift a hammer at work and it felt difficult to do so. Also notes some decreased sensation in the fingertips of this L hand. Has noted recurrent dizziness with position change x 1 month; seen at [facility] for this on 08/04/20 and has been taking the Norvasc and Meclizine prescribed from this visit. Does not have a PCP provider.  Patient had an MRI completed tonight which does show findings consistent with an acute 1 cm infarct in the right paramedial pons.  There is an additional punctate acute to subacute ischemic infarct in the posterior right parietal centrum semiovale as well as evidence of scattered chronic microhemorrhages favored secondary to untreated hypertension.  Patient has been updated on his MRI findings and verbalizes understanding of his need to stay in the hospital for continued evaluation.  He will be seen in consultation by neurology.  Case discussed with MD Thomasena Edis.  Page placed for unassigned medical admission.  4:06 AM Dr. Loney Loh to admit.  Vitals:   08/25/20 0018 08/25/20 0238 08/25/20 0315 08/25/20 0330  BP: (!) 153/97 (!) 155/98 (!) 147/97 (!) 153/97  Pulse: 63 60 (!) 59 (!) 59  Resp: 16 16 14 14   Temp: 99.2 F (37.3 C)     TempSrc: Oral     SpO2: 98% 100% 95% 96%   MR BRAIN WO CONTRAST  Result Date: 08/25/2020 CLINICAL DATA:  Initial evaluation for neuro deficit, stroke suspected, left-sided weakness. EXAM: MRI HEAD WITHOUT CONTRAST TECHNIQUE: Multiplanar, multiecho pulse sequences of the brain and surrounding structures were obtained without intravenous contrast. COMPARISON:  Prior CTA from 08/24/2020. FINDINGS: Brain: Generalized age-related cerebral atrophy. Scattered patchy and confluent T2/FLAIR hyperintensity within the periventricular deep white  matter both cerebral hemispheres felt to be most consistent with chronic small vessel ischemic disease, overall moderate in nature. Multiple scattered remote lacunar infarcts noted about the bilateral basal ganglia and hemispheric cerebral white matter. 1 cm acute ischemic infarct seen involving the right paramedian pons (series 5, image 65). No significant regional mass effect. Suspected associated faint petechial hemorrhage without hemorrhagic transformation (series 14, image 20). Additional punctate acute to subacute ischemic infarct noted within the deep white matter of the posterior right parietal centrum semi ovale (series 5, image 83). No other evidence for acute or subacute ischemia. Gray-white matter differentiation otherwise maintained. No other acute intracranial hemorrhage. Few scattered subcentimeter foci of susceptibility artifacts seen involving the supratentorial brain, favored to be related to underlying hypertension. No mass lesion, midline shift or mass effect. No hydrocephalus or extra-axial fluid collection. Pituitary gland suprasellar region normal. Midline structures intact. Vascular: Major intracranial vascular flow voids are maintained. Skull and upper cervical spine: Craniocervical junction normal. Bone marrow signal intensity within normal limits. No scalp soft tissue abnormality. Sinuses/Orbits: Globes and orbital soft tissues within normal limits. Paranasal sinuses are largely clear. Chronic appearing left mastoid effusion noted. Inner ear structures grossly normal. Other: None. IMPRESSION: 1. 1 cm acute ischemic infarct involving the right paramedian pons. Suspected associated faint petechial hemorrhage without frank hemorrhagic transformation or significant regional mass effect. 2. Additional punctate acute to subacute ischemic infarct involving the posterior right parietal centrum semi ovale. 3. Underlying age-related cerebral atrophy with moderate chronic microvascular ischemic  disease, with multiple scattered remote lacunar infarcts about the bilateral basal ganglia and hemispheric  cerebral white matter. 4. Multiple scattered chronic micro hemorrhages involving the supratentorial brain, favored to be related to poorly controlled hypertension. Cerebral amyloid angiopathy could also be considered, although is felt to be less likely. Electronically Signed   By: Rise Mu M.D.   On: 08/25/2020 02:30      Antony Madura, PA-C 08/25/20 0406    Alvira Monday, MD 08/26/20 2208

## 2020-08-25 NOTE — ED Notes (Signed)
Patient resting without complaints

## 2020-08-25 NOTE — H&P (Signed)
History and Physical    Paul [name] ZOX:[id number] DOB: 1961-07-13 DOA: 08/24/2020  PCP: Patient, No Pcp Per Patient coming from: [facility]  Chief Complaint: Dizziness, left-sided weakness  HPI: Paul [name] is a 59 y.o. male with medical history significant of hypertension, BPH, tobacco use, alcohol use presenting to the ED with complaints of dizziness and left-sided weakness.  Patient reports 1 month history of dizziness/lightheadedness and nausea.  He has been feeling "off balance" when he tries to walk.  States for the past 4 days he has noticed that his left arm and leg are weak.  He has had some numbness in his fingers in both hands.  States he bought aspirin 81 mg from the pharmacy and started taking it 2 days ago. States "I don't take medicines unless I have to."  Denies history of prior stroke.  He has no other complaints.  Denies fevers, cough, shortness of breath, chest pain, vomiting, abdominal pain, or diarrhea.  ED Course: Blood pressure elevated with systolic up to 170s. Remainder of vital signs stable. WBC 8.5, hemoglobin 14.1, hematocrit 45.0, platelet 186K. INR 1.0. Sodium 139, potassium 3.3, chloride 103, bicarb 25, BUN 15, creatinine 0.8, glucose 225. Screening SARS-CoV-2 PCR test and influenza panel both negative.  CTA head and neck negative for LVO or acute intracranial abnormality.  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 Suspected associated faint petechial hemorrhage without frank hemorrhagic transformation or significant regional mass-effect. Additional punctate acute to subacute ischemic infarct involving the posterior right parietal centrum semi ovale.  Neurology consulted by ED provider.  Review of Systems:  All systems reviewed and apart from history of presenting illness, are negative.  Past Medical History:  Diagnosis Date  . Hypertension   . Urinary frequency     Past Surgical History:  Procedure Laterality  Date  . COLONOSCOPY WITH PROPOFOL N/A 01/31/2016   Procedure: COLONOSCOPY WITH PROPOFOL;  Surgeon: Charolett Bumpers, MD;  Location: [facility];  Service: Endoscopy;  Laterality: N/A;  . NO PAST SURGERIES       reports that he has been smoking. He has a 15.00 pack-year smoking history. He has never used smokeless tobacco. He reports current alcohol use. He reports that he does not use drugs.  No Known Allergies  History reviewed. No pertinent family history.  Prior to Admission medications   Medication Sig Start Date End Date Taking? Authorizing Provider  albuterol (VENTOLIN HFA) 108 (90 Base) MCG/ACT inhaler Inhale 1-2 puffs into the lungs every 4 (four) hours as needed for wheezing or shortness of breath. 08/04/20   Domenick Gong, MD  amLODipine (NORVASC) 5 MG tablet Take 1 tablet (5 mg total) by mouth daily. 08/04/20 09/03/20  Domenick Gong, MD  fluticasone (FLONASE) 50 MCG/ACT nasal spray Place 2 sprays into both nostrils daily. 08/04/20   Domenick Gong, MD  meclizine (ANTIVERT) 25 MG tablet Take 1 tablet (25 mg total) by mouth 3 (three) times daily as needed for dizziness. 08/04/20   Domenick Gong, MD  Omega-3 Fatty Acids (OMEGA 3 PO) Take 1 capsule by mouth daily.    [provider]  Polyvinyl Alcohol-Povidone (REFRESH OP) Place 1 drop into both eyes daily as needed (For dry eyes.).    [provider]  Spacer/Aero-Holding Chambers (AEROCHAMBER PLUS) inhaler Use with inhaler 08/04/20   Domenick Gong, MD  tamsulosin (FLOMAX) 0.4 MG CAPS capsule Take 0.4 mg by mouth every evening.    [provider]  amLODipine-benazepril (  LOTREL) 5-20 MG capsule Take 1 capsule by mouth daily. 01/02/16 08/04/20  [provider]    Physical Exam: Vitals:   08/25/20 0345 08/25/20 0400 08/25/20 0445 08/25/20 0515  BP: (!) 156/100 (!) 172/97 130/89 (!) 145/93  Pulse: 63 77 (!) 59 60  Resp: Temp:      TempSrc:      SpO2: 100% 98% 95%  96%    Physical Exam Constitutional:      General: He is not in acute distress. HENT:     Head: Normocephalic and atraumatic.  Eyes:     Extraocular Movements: Extraocular movements intact.     Conjunctiva/sclera: Conjunctivae normal.  Cardiovascular:     Rate and Rhythm: Normal rate and regular rhythm.     Pulses: Normal pulses.  Pulmonary:     Effort: Pulmonary effort is normal. No respiratory distress.  Abdominal:     General: Bowel sounds are normal. There is no distension.     Palpations: Abdomen is soft.     Tenderness: There is no abdominal tenderness.  Musculoskeletal:        General: No swelling or tenderness.     Cervical back: Normal range of motion and neck supple.  Skin:    General: Skin is warm and dry.  Neurological:     Mental Status: He is alert and oriented to person, place, and time.     Sensory: No sensory deficit.     Motor: No weakness.     Labs on Admission: I have personally reviewed following labs and imaging studies  CBC: Recent Labs  Lab 08/24/20 1820 08/24/20 1828  WBC 8.5  --   NEUTROABS 6.4  --   HGB 14.1 16.0  HCT 45.0 47.0  MCV 74.9*  --   PLT 186  --    Basic Metabolic Panel: Recent Labs  Lab 08/24/20 1820 08/24/20 1828  NA 139 143  K 3.3* 3.2*  CL 103 104  CO2 25  --   GLUCOSE 225* 226*  BUN 15 15  CREATININE 0.87 0.90  CALCIUM 9.3  --    GFR: CrCl cannot be calculated (Unknown ideal weight.). Liver Function Tests: Recent Labs  Lab 08/24/20 1820  AST 31  ALT 37  ALKPHOS 68  BILITOT 0.2*  PROT 7.8  ALBUMIN 4.2   No results for input(s): LIPASE, AMYLASE in the last 168 hours. No results for input(s): AMMONIA in the last 168 hours. Coagulation Profile: Recent Labs  Lab 08/24/20 1820  INR 1.0   Cardiac Enzymes: No results for input(s): CKTOTAL, CKMB, CKMBINDEX, TROPONINI in the last 168 hours. BNP (last 3 results) No results for input(s): PROBNP in the last 8760 hours. HbA1C: No results for input(s):  HGBA1C in the last 72 hours. CBG: Recent Labs  Lab 08/24/20 1809  GLUCAP 222*   Lipid Profile: No results for input(s): CHOL, HDL, LDLCALC, TRIG, CHOLHDL, LDLDIRECT in the last 72 hours. Thyroid Function Tests: No results for input(s): TSH, T4TOTAL, FREET4, T3FREE, THYROIDAB in the last 72 hours. Anemia Panel: No results for input(s): VITAMINB12, FOLATE, FERRITIN, TIBC, IRON, RETICCTPCT in the last 72 hours. Urine analysis:    Component Value Date/Time   COLORURINE YELLOW 03/09/2019 1554   APPEARANCEUR CLEAR 03/09/2019 1554   LABSPEC 1.014 03/09/2019 1554   PHURINE 6.0 03/09/2019 1554   GLUCOSEU NEGATIVE 03/09/2019 1554   HGBUR NEGATIVE 03/09/2019 1554   BILIRUBINUR NEGATIVE 03/09/2019 1554   KETONESUR 20 (A) 03/09/2019 1554  PROTEINUR 100 (A) 03/09/2019 1554   NITRITE NEGATIVE 03/09/2019 1554   LEUKOCYTESUR NEGATIVE 03/09/2019 1554    Radiological Exams on Admission: CT Angio Head W or Wo Contrast  Result Date: 08/24/2020 CLINICAL DATA:  59 year old male with persistent, recurrent dizziness. Weakness and tingling in the left extremities for 2 days. EXAM: CT ANGIOGRAPHY HEAD AND NECK TECHNIQUE: Multidetector CT imaging of the head and neck was performed using the standard protocol during bolus administration of intravenous contrast. Multiplanar CT image reconstructions and MIPs were obtained to evaluate the vascular anatomy. Carotid stenosis measurements (when applicable) are obtained utilizing NASCET criteria, using the distal internal carotid diameter as the denominator. CONTRAST:  27mL OMNIPAQUE IOHEXOL 350 MG/ML SOLN COMPARISON:  Brain MRI 12/15/2015. FINDINGS: CT HEAD Brain: Mild Calcified atherosclerosis at the skull base. Cerebral volume is stable since 2017, normal for age. Scattered bilateral white matter hypodensity appears similar to the abnormal FLAIR signal in 2017. Superimposed dystrophic calcifications at the bilateral basal ganglia. No midline shift, ventriculomegaly,  mass effect, evidence of mass lesion, intracranial hemorrhage or evidence of cortically based acute infarction. No cortical encephalomalacia identified. Calvarium and skull base: No acute osseous abnormality identified. Paranasal sinuses: Chronic left mastoid effusion, coalescence. Other Visualized paranasal sinuses and mastoids are stable and well pneumatized. Orbits: Visualized orbits and scalp soft tissues are within normal limits. CTA NECK Skeleton: No acute osseous abnormality identified. Lower cervical disc and endplate degeneration including vacuum disc at C6-C7. Upper chest: Paraseptal emphysema in the right lung apex. Other neck: Negative. Aortic arch: 3 vessel arch configuration. Minor arch atherosclerosis. Right carotid system: Negative brachiocephalic artery. Mildly tortuous proximal right CCA. Mild soft and calcified plaque at the right ICA origin and bulb. Tortuous right ICA below the skull base without stenosis. Left carotid system: Negative left CCA origin. Mild to moderate mostly calcified plaque at the posterior left ICA origin without stenosis. Tortuous left ICA distal to the bulb. Vertebral arteries: Negative proximal right subclavian artery and right vertebral artery origin. Dominant appearing right vertebral artery with intermittent tortuosity is widely patent to the skull base. Left vertebral artery has an early origin from the subclavian without stenosis. Non dominant left vertebral has a slightly late entry to the cervical transverse foramen and is normal to the skull base without stenosis. CTA HEAD Posterior circulation: Tortuous distal vertebral arteries, the right is dominant. Minimal V4 segment calcified plaque without stenosis. Normal PICA origins. Patent, tortuous basilar artery without stenosis. Patent SCA and PCA origins. Tortuous left P1 segment. Posterior communicating arteries are diminutive or absent. Bilateral PCA branches are within normal limits. Anterior circulation: Both ICA  siphons are patent. Mild supraclinoid calcified plaque greater on the left. No siphon stenosis. Normal ophthalmic artery origins. Patent carotid termini, MCA and ACA origins. Tortuous ACAs. Normal anterior communicating artery. Median artery of the corpus callosum (normal variant). Left MCA M1 segment and bifurcation are patent without stenosis. Left MCA branches are normal aside from mild tortuosity. Right MCA M1 segment is tortuous. Patent right MCA bifurcation without stenosis. Right MCA branches are within normal limits. Venous sinuses: Patent. Anatomic variants: Dominant right vertebral artery. Early origin of the left vertebral from the subclavian. Review of the MIP images confirms the above findings IMPRESSION: 1. Negative for large vessel occlusion. 2. Generalized arterial tortuosity in the head and neck. Mild for age atherosclerosis with no significant arterial stenosis. 3. Chronically advanced but nonspecific cerebral white matter changes, similar to a 2017 MRI. No acute intracranial abnormality. 4. Sequelae of remote left  mastoid inflammation appears stable since 2017. Left tympanic cavity appears clear. 5. Emphysema (ICD10-J43.9). Electronically Signed   By: Odessa FlemingH  Hall M.D.   On: 08/24/2020 22:24   CT Angio Neck W and/or Wo Contrast  Result Date: 08/24/2020 CLINICAL DATA:  59 year old male with persistent, recurrent dizziness. Weakness and tingling in the left extremities for 2 days. EXAM: CT ANGIOGRAPHY HEAD AND NECK TECHNIQUE: Multidetector CT imaging of the head and neck was performed using the standard protocol during bolus administration of intravenous contrast. Multiplanar CT image reconstructions and MIPs were obtained to evaluate the vascular anatomy. Carotid stenosis measurements (when applicable) are obtained utilizing NASCET criteria, using the distal internal carotid diameter as the denominator. CONTRAST:  75mL OMNIPAQUE IOHEXOL 350 MG/ML SOLN COMPARISON:  Brain MRI 12/15/2015. FINDINGS: CT  HEAD Brain: Mild Calcified atherosclerosis at the skull base. Cerebral volume is stable since 2017, normal for age. Scattered bilateral white matter hypodensity appears similar to the abnormal FLAIR signal in 2017. Superimposed dystrophic calcifications at the bilateral basal ganglia. No midline shift, ventriculomegaly, mass effect, evidence of mass lesion, intracranial hemorrhage or evidence of cortically based acute infarction. No cortical encephalomalacia identified. Calvarium and skull base: No acute osseous abnormality identified. Paranasal sinuses: Chronic left mastoid effusion, coalescence. Other Visualized paranasal sinuses and mastoids are stable and well pneumatized. Orbits: Visualized orbits and scalp soft tissues are within normal limits. CTA NECK Skeleton: No acute osseous abnormality identified. Lower cervical disc and endplate degeneration including vacuum disc at C6-C7. Upper chest: Paraseptal emphysema in the right lung apex. Other neck: Negative. Aortic arch: 3 vessel arch configuration. Minor arch atherosclerosis. Right carotid system: Negative brachiocephalic artery. Mildly tortuous proximal right CCA. Mild soft and calcified plaque at the right ICA origin and bulb. Tortuous right ICA below the skull base without stenosis. Left carotid system: Negative left CCA origin. Mild to moderate mostly calcified plaque at the posterior left ICA origin without stenosis. Tortuous left ICA distal to the bulb. Vertebral arteries: Negative proximal right subclavian artery and right vertebral artery origin. Dominant appearing right vertebral artery with intermittent tortuosity is widely patent to the skull base. Left vertebral artery has an early origin from the subclavian without stenosis. Non dominant left vertebral has a slightly late entry to the cervical transverse foramen and is normal to the skull base without stenosis. CTA HEAD Posterior circulation: Tortuous distal vertebral arteries, the right is  dominant. Minimal V4 segment calcified plaque without stenosis. Normal PICA origins. Patent, tortuous basilar artery without stenosis. Patent SCA and PCA origins. Tortuous left P1 segment. Posterior communicating arteries are diminutive or absent. Bilateral PCA branches are within normal limits. Anterior circulation: Both ICA siphons are patent. Mild supraclinoid calcified plaque greater on the left. No siphon stenosis. Normal ophthalmic artery origins. Patent carotid termini, MCA and ACA origins. Tortuous ACAs. Normal anterior communicating artery. Median artery of the corpus callosum (normal variant). Left MCA M1 segment and bifurcation are patent without stenosis. Left MCA branches are normal aside from mild tortuosity. Right MCA M1 segment is tortuous. Patent right MCA bifurcation without stenosis. Right MCA branches are within normal limits. Venous sinuses: Patent. Anatomic variants: Dominant right vertebral artery. Early origin of the left vertebral from the subclavian. Review of the MIP images confirms the above findings IMPRESSION: 1. Negative for large vessel occlusion. 2. Generalized arterial tortuosity in the head and neck. Mild for age atherosclerosis with no significant arterial stenosis. 3. Chronically advanced but nonspecific cerebral white matter changes, similar to a 2017 MRI. No acute intracranial  abnormality. 4. Sequelae of remote left mastoid inflammation appears stable since 2017. Left tympanic cavity appears clear. 5. Emphysema (ICD10-J43.9). Electronically Signed   By: Odessa Fleming M.D.   On: 08/24/2020 22:24   MR BRAIN WO CONTRAST  Result Date: 08/25/2020 CLINICAL DATA:  Initial evaluation for neuro deficit, stroke suspected, left-sided weakness. EXAM: MRI HEAD WITHOUT CONTRAST TECHNIQUE: Multiplanar, multiecho pulse sequences of the brain and surrounding structures were obtained without intravenous contrast. COMPARISON:  Prior CTA from 08/24/2020. FINDINGS: Brain: Generalized age-related  cerebral atrophy. Scattered patchy and confluent T2/FLAIR hyperintensity within the periventricular deep white matter both cerebral hemispheres felt to be most consistent with chronic small vessel ischemic disease, overall moderate in nature. Multiple scattered remote lacunar infarcts noted about the bilateral basal ganglia and hemispheric cerebral white matter. 1 cm acute ischemic infarct seen involving the right paramedian pons (series 5, image 65). No significant regional mass effect. Suspected associated faint petechial hemorrhage without hemorrhagic transformation (series 14, image 20). Additional punctate acute to subacute ischemic infarct noted within the deep white matter of the posterior right parietal centrum semi ovale (series 5, image 83). No other evidence for acute or subacute ischemia. Gray-white matter differentiation otherwise maintained. No other acute intracranial hemorrhage. Few scattered subcentimeter foci of susceptibility artifacts seen involving the supratentorial brain, favored to be related to underlying hypertension. No mass lesion, midline shift or mass effect. No hydrocephalus or extra-axial fluid collection. Pituitary gland suprasellar region normal. Midline structures intact. Vascular: Major intracranial vascular flow voids are maintained. Skull and upper cervical spine: Craniocervical junction normal. Bone marrow signal intensity within normal limits. No scalp soft tissue abnormality. Sinuses/Orbits: Globes and orbital soft tissues within normal limits. Paranasal sinuses are largely clear. Chronic appearing left mastoid effusion noted. Inner ear structures grossly normal. Other: None. IMPRESSION: 1. 1 cm acute ischemic infarct involving the right paramedian pons. Suspected associated faint petechial hemorrhage without frank hemorrhagic transformation or significant regional mass effect. 2. Additional punctate acute to subacute ischemic infarct involving the posterior right parietal  centrum semi ovale. 3. Underlying age-related cerebral atrophy with moderate chronic microvascular ischemic disease, with multiple scattered remote lacunar infarcts about the bilateral basal ganglia and hemispheric cerebral white matter. 4. Multiple scattered chronic micro hemorrhages involving the supratentorial brain, favored to be related to poorly controlled hypertension. Cerebral amyloid angiopathy could also be considered, although is felt to be less likely. Electronically Signed   By: Rise Mu M.D.   On: 08/25/2020 02:30    EKG: Independently reviewed. Sinus rhythm. T wave inversions in inferior leads and slight ST elevations in anterior leads. No prior tracing for comparison.  Assessment/Plan Principal Problem:   Acute CVA (cerebrovascular accident) (HCC) Active Problems:   HTN (hypertension)   BPH (benign prostatic hyperplasia)   Tobacco use   Alcohol use   Acute CVA Patient is presenting with complaints of dizziness for the past 1 month and left-sided weakness and paresthesias since yesterday. CTA head and neck negative for LVO or acute intracranial abnormality. 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 Suspected associated faint petechial hemorrhage without frank hemorrhagic transformation or significant regional mass-effect. Additional punctate acute to subacute ischemic infarct involving the posterior right parietal centrum semi ovale. -Neurology consulted, recommendations pending -Recommendations regarding antiplatelet therapy per neurology -Start high intensity statin -Telemetry monitoring -Allow for permissive hypertension -2D echocardiogram -Hemoglobin A1c, fasting lipid panel -Frequent neurochecks -PT, OT, speech therapy. -N.p.o. until cleared by bedside swallow evaluation or formal speech evaluation  Hypertension Systolic currently in the 130s to 140s. -Allow permissive hypertension given acute  stroke  BPH -Resume home Flomax after pharmacy med rec is done  Tobacco use -NicoDerm patch and counseling  Alcohol use disorder No signs of acute withdrawal at this time. -CIWA protocol; Ativan as needed. Thiamine, folate, and multivitamin. Check mag and Phos levels.  Mild hypokalemia -Replete potassium. Check magnesium level and replete if low. Continue to monitor electrolytes.  Abnormal EKG EKG showing T wave inversions in inferior leads and slight ST elevations in anterior leads. No prior tracing for comparison. Patient is not endorsing any anginal symptoms. -Cardiac monitoring. Check high-sensitivity troponin level and order echocardiogram.  DVT prophylaxis: SCDs at this time Code Status: Full code-discussed with the patient. Family Communication: No family available at this time. Disposition Plan: Status is: Inpatient  Remains inpatient appropriate because:Ongoing diagnostic testing needed not appropriate for outpatient work up, IV treatments appropriate due to intensity of illness or inability to take PO and Inpatient level of care appropriate due to severity of illness   Dispo: The patient is from: Home              Anticipated d/c is to: Home              Anticipated d/c date is: 2 days              Patient currently is not medically stable to d/c.  The medical decision making on this patient was of high complexity and the patient is at high risk for clinical deterioration, therefore this is a level 3 visit.  John Giovanni MD [facility]  If 7PM-7AM, please contact night-coverage [email or web address]  08/25/2020, 5:29 AM

## 2020-08-25 NOTE — Progress Notes (Signed)
  Echocardiogram 2D Echocardiogram has been performed.  Pieter Partridge 08/25/2020, 10:08 AM

## 2020-08-25 NOTE — ED Notes (Signed)
Received pt from lobby per wheelchair.  

## 2020-08-25 NOTE — Consult Note (Signed)
Neurology Consult H&P  CC: acute stroke  History is obtained from: patient  HPI: Othell Jaime is a 59 y.o. male BPH, tobacco use, alcohol use and HTN with complaints of dizziness and left-sided weakness. Patient reports 1 month ago started to feel dizziness/lightheadedness and nausea which was episodic. He then developed persistent feeling "off balance" and unsteadiness when attempting to walk as if being pulled to his left side. Over the past 4 days he noticed left arm and leg are weakness and numbness in fingers of both his hands.   He bought aspirin 81mg  started taking 2 days ago.   States "I don't take medicines unless I have to."  Denies history of prior stroke.    Home blood pressures range from 140s - 190s  Denies fevers, cough, shortness of breath, chest pain, vomiting, abdominal pain, or diarrhea.   LKW: as above tpa given?: No, OSW IR Thrombectomy? No lvo Modified Rankin Scale: 0-Completely asymptomatic and back to baseline post- stroke NIHSS: 2 face and sensory   ROS: A complete ROS was performed and is negative except as noted in the HPI.  Past Medical History:  Diagnosis Date  . Hypertension   . Urinary frequency      History reviewed. No pertinent family history.  Social History:  reports that he has been smoking. He has a 15.00 pack-year smoking history. He has never used smokeless tobacco. He reports current alcohol use. He reports that he does not use drugs.   Prior to Admission medications   Medication Sig Start Date End Date Taking? Authorizing Provider  albuterol (VENTOLIN HFA) 108 (90 Base) MCG/ACT inhaler Inhale 1-2 puffs into the lungs every 4 (four) hours as needed for wheezing or shortness of breath. 08/04/20   08/06/20, MD  amLODipine (NORVASC) 5 MG tablet Take 1 tablet (5 mg total) by mouth daily. 08/04/20 09/03/20  09/05/20, MD  fluticasone (FLONASE) 50 MCG/ACT nasal spray Place 2 sprays into both nostrils daily. 08/04/20    08/06/20, MD  meclizine (ANTIVERT) 25 MG tablet Take 1 tablet (25 mg total) by mouth 3 (three) times daily as needed for dizziness. 08/04/20   08/06/20, MD  Omega-3 Fatty Acids (OMEGA 3 PO) Take 1 capsule by mouth daily.    [provider]  Polyvinyl Alcohol-Povidone (REFRESH OP) Place 1 drop into both eyes daily as needed (For dry eyes.).    [provider]  Spacer/Aero-Holding Chambers (AEROCHAMBER PLUS) inhaler Use with inhaler 08/04/20   08/06/20, MD  tamsulosin (FLOMAX) 0.4 MG CAPS capsule Take 0.4 mg by mouth every evening.    [provider]  amLODipine-benazepril (LOTREL) 5-20 MG capsule Take 1 capsule by mouth daily. 01/02/16 08/04/20  [provider]    Exam: Current vital signs: BP (!) 139/91   Pulse (!) 58   Temp 99.2 F (37.3 C) (Oral)   Resp 14   SpO2 95%   Physical Exam  Constitutional: Appears well-developed and well-nourished.  Psych: Affect appropriate to situation Eyes: No scleral injection HENT: No OP obstrucion Head: Normocephalic.  Cardiovascular: Normal rate and regular rhythm.  Respiratory: Effort normal and breath sounds normal to anterior ascultation GI: Soft.  No distension. There is no tenderness.  Skin: WDI  Neuro: Mental Status: Patient is awake, alert, oriented to person, place, month, year, and situation. Patient is able to give a clear and coherent history. No signs of aphasia or neglect. Cranial Nerves: II: Visual Fields are full. Pupils are equal, round, and reactive  to light. III,IV, VI: EOMI without ptosis or diploplia.  V: Facial sensation is symmetric to temperature VII: Facial movement is asymmetric left droop - mild.  VIII: hearing is intact to voice X: Uvula elevates symmetrically XI: Shoulder shrug is symmetric. XII: tongue is midline without atrophy or fasciculations.  Motor: Tone is normal. Bulk is normal. 4+/5 left arm and leg. Sensory: Sensation is symmetric to  light touch and temperature in the arms and legs. Deep Tendon Reflexes: 2+ and symmetric in the biceps and patellae. Plantars: Left up. Cerebellar: FNF and HKS are intact bilaterally.  I have reviewed the images obtained: MRI brain showed 1 cm acute ischemic infarct involving the right paramedian pons. Suspected associated faint petechial hemorrhage without frank hemorrhagic transformation or significant regional mass effect. Additional punctate acute to subacute ischemic infarct involving the posterior right parietal centrum semi ovale. Underlying age-related cerebral atrophy with moderate chronic microvascular ischemic disease, with multiple scattered remote lacunar infarcts about the bilateral basal ganglia and hemispheric cerebral white matter. 4. Multiple scattered chronic micro hemorrhages involving the supratentorial brain, favored to be related to poorly controlled hypertension. Cerebral amyloid angiopathy could also be considered, although is felt to be less likely.  Assessment: Emerick Weatherly is a 59 y.o. male PMHx HTN with right pontine stroke with faint petechial hemorrhage with subacute and chronic strokes throughout brain.   Discussed MRI findings with patient which suggested chronic poorly controlled BP and advised him to measure his pressures daily with goal <140/90.  Plan: - Recommend vascular head and neck. - Recommend TTE. - Recommend labs: HbA1c, lipid panel, TSH. - Recommend Statin if LDL > 70 - SBP goal <160. - Telemetry monitoring for arrhythmia. - Recommend bedside Swallow screen. - Recommend Stroke education. - Recommend PT/OT/SLP consult.  Electronically signed by: Dr. Marisue Humble Pager: [phone] 08/25/2020, 6:05 AM

## 2020-08-25 NOTE — Evaluation (Signed)
Speech Language Pathology Evaluation Patient Details Name: Paul [name] MRN: [id number] DOB: 1960/10/04 Today's Date: 08/25/2020 Time: 1120-1140 SLP Time Calculation (min) (ACUTE ONLY): 20 min  Problem List:  Patient Active Problem List   Diagnosis Date Noted  . Acute CVA (cerebrovascular accident) (HCC) 08/25/2020  . HTN (hypertension) 08/25/2020  . BPH (benign prostatic hyperplasia) 08/25/2020  . Tobacco use 08/25/2020  . Alcohol use 08/25/2020   Past Medical History:  Past Medical History:  Diagnosis Date  . Hypertension   . Urinary frequency    Past Surgical History:  Past Surgical History:  Procedure Laterality Date  . COLONOSCOPY WITH PROPOFOL N/A 01/31/2016   Procedure: COLONOSCOPY WITH PROPOFOL;  Surgeon: Charolett Bumpers, MD;  Location: [facility];  Service: Endoscopy;  Laterality: N/A;  . NO PAST SURGERIES     HPI:  Paul [name] is a 59 y.o. male PMHx HTN with right pontine stroke with faint petechial hemorrhage with subacute and chronic strokes throughout brain   Assessment / Plan / Recommendation Clinical Impression  Pt demonstrates mild dysarthria, scored 29 out of 30 on SLUMS. We discussed basic compensatory strategies to target articulation and reviewed BE FAST. No further SLP interventions needed at this time.     SLP Assessment  SLP Recommendation/Assessment: Patient does not need any further Speech Lanaguage Pathology Services    Follow Up Recommendations       Frequency and Duration           SLP Evaluation Cognition  Overall Cognitive Status: Within Functional Limits for tasks assessed       Comprehension  Auditory Comprehension Overall Auditory Comprehension: Appears within functional limits for tasks assessed    Expression Verbal Expression Overall Verbal Expression: Appears within functional limits for tasks assessed Written Expression Dominant Hand: Right   Oral / Motor  Motor Speech Overall Motor Speech: Impaired Respiration: Within  functional limits Phonation: Normal Resonance: Within functional limits Articulation: Impaired Level of Impairment: Conversation Intelligibility: Intelligible Motor Planning: Witnin functional limits Motor Speech Errors: Aware;Consistent   GO                    Paul [name], Riley Nearing 08/25/2020, 12:27 PM

## 2020-08-25 NOTE — ED Notes (Signed)
Arrives from [facility] for MRI. Ambulatory, no distress.

## 2020-08-25 NOTE — Progress Notes (Signed)
PROGRESS NOTE  Irish EldersSuan [name] NFA:[id number] DOB: 11/22/1960 DOA: 08/24/2020 PCP: Patient, No Pcp Per  HPI/Recap of past 24 hours: HPI from [name] is a 59 y.o. male with medical history significant of hypertension, BPH, tobacco use, alcohol use presenting to the ED with complaints of dizziness and left-sided weakness.  Patient reports 1 month history of dizziness/lightheadedness and nausea.  He has been feeling "off balance" when he tries to walk.  States for the past 4 days he has noticed that his left arm and leg are weak.  He has had some numbness in his fingers in both hands.  States he bought aspirin 81 mg from the pharmacy and started taking it 2 days ago. States "I don't take medicines unless I have to."  Denies history of prior stroke. In the ED, Blood pressure elevated with systolic up to 170s. Remainder of vital signs stable. Labs fairly stable. Screening SARS-CoV-2 PCR test and influenza panel both negative. CTA head and neck negative for LVO or acute intracranial abnormality. 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 Suspected associated faint petechial hemorrhage without frank hemorrhagic transformation or significant regional mass-effect. Additional punctate acute to subacute ischemic infarct involving the posterior right parietal centrum semi ovale. Neurology consulted by ED provider.  Patient admitted for further management    Today, patient reports some nausea, otherwise denies any further dizziness/lightheadedness, no worsening weakness, no chest pain, no abdominal pain, no nausea/vomiting   Assessment/Plan: Principal Problem:   Acute CVA (cerebrovascular accident) (HCC) Active Problems:   HTN (hypertension)   BPH (benign prostatic hyperplasia)   Tobacco use   Alcohol use   Acute CVA Brain MRI showing a 1 cm acute ischemic infarct involving the right paramedian pons. Suspected associated faint petechial hemorrhage  without frank hemorrhagic transformation or significant regional mass-effect. Additional punctate acute to subacute ischemic infarct involving the posterior right parietal centrum semi ovale LDL, A1c pending Echo showed EF of 55 to 60%, no regional wall motion abnormality, grade 1 diastolic dysfunction, inter atrial septum was not well visualized Neurology consulted Continue statins, aspirin, Plavix PT/OT/SLP  Hypertension Allow permissive hypertension given acute stroke  Tobacco use NicoDerm patch and counseling  Alcohol use disorder No signs of acute withdrawal at this time. CIWA protocol; Ativan as needed. Thiamine, folate, and multivitamin. Check mag and Phos levels.  Mild hypokalemia Replace as needed  Abnormal EKG EKG showing T wave inversions in inferior leads and slight ST elevations in anterior leads. No prior tracing for comparison. Patient is not endorsing any anginal symptoms. Troponin negative Echo as above Cardiac monitoring  Hyperglycemia A1c pending SSI, Accu-Cheks, hypoglycemic protocol        Malnutrition Type:      Malnutrition Characteristics:      Nutrition Interventions:       Estimated body mass index is 25.98 kg/m as calculated from the following:   Height as of this encounter: 5\' 6"  (1.676 m).   Weight as of this encounter: 73 kg.     Code Status: Full  Family Communication: None at bedside  Disposition Plan: Status is: Inpatient  Remains inpatient appropriate because:Inpatient level of care appropriate due to severity of illness   Dispo: The patient is from: Home              Anticipated d/c is to: Home              Anticipated d/c date is: 1 day  Patient currently is not medically stable to d/c.    Consultants:  Neurology  Procedures:  None  Antimicrobials:  None  DVT prophylaxis: SCDs   Objective: Vitals:   08/25/20 1500 08/25/20 1530 08/25/20 1618 08/25/20 1751  BP: (!) 147/99 (!)  151/97 (!) 160/91 (!) 160/91  Pulse: 63 62 (!) 58 (!) 58  Resp: 13 11 16 16   Temp:   98 F (36.7 C) 98 F (36.7 C)  TempSrc:   Oral Oral  SpO2: 96% 96% 99%   Weight:    73 kg  Height:    5\' 6"  (1.676 m)   No intake or output data in the 24 hours ending 08/25/20 1836 Filed Weights   08/25/20 1751  Weight: 73 kg    Exam:  General: NAD   Cardiovascular: S1, S2 present  Respiratory: CTAB  Abdomen: Soft, nontender, nondistended, bowel sounds present  Musculoskeletal: No bilateral pedal edema noted  Skin: Normal  Psychiatry: Normal mood  Neurology: Strength equal in all extremities, no obvious neurologic deficits noted   Data Reviewed: CBC: Recent Labs  Lab 08/24/20 1820 08/24/20 1828  WBC 8.5  --   NEUTROABS 6.4  --   HGB 14.1 16.0  HCT 45.0 47.0  MCV 74.9*  --   PLT 186  --    Basic Metabolic Panel: Recent Labs  Lab 08/24/20 1820 08/24/20 1828 08/25/20 0608  NA 139 143  --   K 3.3* 3.2*  --   CL 103 104  --   CO2 25  --   --   GLUCOSE 225* 226*  --   BUN 15 15  --   CREATININE 0.87 0.90  --   CALCIUM 9.3  --   --   MG  --   --  1.9  PHOS  --   --  3.1   GFR: Estimated Creatinine Clearance: 77.8 mL/min (by C-G formula based on SCr of 0.9 mg/dL). Liver Function Tests: Recent Labs  Lab 08/24/20 1820  AST 31  ALT 37  ALKPHOS 68  BILITOT 0.2*  PROT 7.8  ALBUMIN 4.2   No results for input(s): LIPASE, AMYLASE in the last 168 hours. No results for input(s): AMMONIA in the last 168 hours. Coagulation Profile: Recent Labs  Lab 08/24/20 1820  INR 1.0   Cardiac Enzymes: No results for input(s): CKTOTAL, CKMB, CKMBINDEX, TROPONINI in the last 168 hours. BNP (last 3 results) No results for input(s): PROBNP in the last 8760 hours. HbA1C: No results for input(s): HGBA1C in the last 72 hours. CBG: Recent Labs  Lab 08/24/20 1809  GLUCAP 222*   Lipid Profile: No results for input(s): CHOL, HDL, LDLCALC, TRIG, CHOLHDL, LDLDIRECT in the last  72 hours. Thyroid Function Tests: No results for input(s): TSH, T4TOTAL, FREET4, T3FREE, THYROIDAB in the last 72 hours. Anemia Panel: No results for input(s): VITAMINB12, FOLATE, FERRITIN, TIBC, IRON, RETICCTPCT in the last 72 hours. Urine analysis:    Component Value Date/Time   COLORURINE YELLOW 03/09/2019 1554   APPEARANCEUR CLEAR 03/09/2019 1554   LABSPEC 1.014 03/09/2019 1554   PHURINE 6.0 03/09/2019 1554   GLUCOSEU NEGATIVE 03/09/2019 1554   HGBUR NEGATIVE 03/09/2019 1554   BILIRUBINUR NEGATIVE 03/09/2019 1554   KETONESUR 20 (A) 03/09/2019 1554   PROTEINUR 100 (A) 03/09/2019 1554   NITRITE NEGATIVE 03/09/2019 1554   LEUKOCYTESUR NEGATIVE 03/09/2019 1554   Sepsis Labs: @LABRCNTIP (procalcitonin:4,lacticidven:4)  ) Recent Results (from the past 240 hour(s))  Resp Panel by RT-PCR (Flu A&B, Covid) Nasopharyngeal  Swab     Status: None   Collection Time: 08/24/20 11:03 PM   Specimen: Nasopharyngeal Swab; Nasopharyngeal(NP) swabs in vial transport medium  Result Value Ref Range Status   SARS Coronavirus 2 by RT PCR NEGATIVE NEGATIVE Final    Comment: (NOTE) SARS-CoV-2 target nucleic acids are NOT DETECTED.  The SARS-CoV-2 RNA is generally detectable in upper respiratory specimens during the acute phase of infection. The lowest concentration of SARS-CoV-2 viral copies this assay can detect is 138 copies/mL. A negative result does not preclude SARS-Cov-2 infection and should not be used as the sole basis for treatment or other patient management decisions. A negative result may occur with  improper specimen collection/handling, submission of specimen other than nasopharyngeal swab, presence of viral mutation(s) within the areas targeted by this assay, and inadequate number of viral copies(<138 copies/mL). A negative result must be combined with clinical observations, patient history, and epidemiological information. The expected result is Negative.  Fact Sheet for Patients:   BloggerCourse.com  Fact Sheet for Healthcare Providers:  SeriousBroker.it  This test is no t yet approved or cleared by the [place] FDA and  has been authorized for detection and/or diagnosis of SARS-CoV-2 by FDA under an Emergency Use Authorization (EUA). This EUA will remain  in effect (meaning this test can be used) for the duration of the COVID-19 declaration under Section 564(b)(1) of the Act, 21 U.S.C.section 360bbb-3(b)(1), unless the authorization is terminated  or revoked sooner.       Influenza A by PCR NEGATIVE NEGATIVE Final   Influenza B by PCR NEGATIVE NEGATIVE Final    Comment: (NOTE) The Xpert Xpress SARS-CoV-2/FLU/RSV plus assay is intended as an aid in the diagnosis of influenza from Nasopharyngeal swab specimens and should not be used as a sole basis for treatment. Nasal washings and aspirates are unacceptable for Xpert Xpress SARS-CoV-2/FLU/RSV testing.  Fact Sheet for Patients: BloggerCourse.com  Fact Sheet for Healthcare Providers: SeriousBroker.it  This test is not yet approved or cleared by the [place] FDA and has been authorized for detection and/or diagnosis of SARS-CoV-2 by FDA under an Emergency Use Authorization (EUA). This EUA will remain in effect (meaning this test can be used) for the duration of the COVID-19 declaration under Section 564(b)(1) of the Act, 21 U.S.C. section 360bbb-3(b)(1), unless the authorization is terminated or revoked.  Performed at [facility], [address], [place], [place] [address]       Studies: CT Angio Head W or Wo Contrast  Result Date: 08/24/2020 CLINICAL DATA:  59 year old male with persistent, recurrent dizziness. Weakness and tingling in the left extremities for 2 days. EXAM: CT ANGIOGRAPHY HEAD AND NECK TECHNIQUE: Multidetector CT imaging of the head and neck was performed  using the standard protocol during bolus administration of intravenous contrast. Multiplanar CT image reconstructions and MIPs were obtained to evaluate the vascular anatomy. Carotid stenosis measurements (when applicable) are obtained utilizing NASCET criteria, using the distal internal carotid diameter as the denominator. CONTRAST:  75mL OMNIPAQUE IOHEXOL 350 MG/ML SOLN COMPARISON:  Brain MRI 12/15/2015. FINDINGS: CT HEAD Brain: Mild Calcified atherosclerosis at the skull base. Cerebral volume is stable since 2017, normal for age. Scattered bilateral white matter hypodensity appears similar to the abnormal FLAIR signal in 2017. Superimposed dystrophic calcifications at the bilateral basal ganglia. No midline shift, ventriculomegaly, mass effect, evidence of mass lesion, intracranial hemorrhage or evidence of cortically based acute infarction. No cortical encephalomalacia identified. Calvarium and skull base: No acute osseous abnormality identified. Paranasal  sinuses: Chronic left mastoid effusion, coalescence. Other Visualized paranasal sinuses and mastoids are stable and well pneumatized. Orbits: Visualized orbits and scalp soft tissues are within normal limits. CTA NECK Skeleton: No acute osseous abnormality identified. Lower cervical disc and endplate degeneration including vacuum disc at C6-C7. Upper chest: Paraseptal emphysema in the right lung apex. Other neck: Negative. Aortic arch: 3 vessel arch configuration. Minor arch atherosclerosis. Right carotid system: Negative brachiocephalic artery. Mildly tortuous proximal right CCA. Mild soft and calcified plaque at the right ICA origin and bulb. Tortuous right ICA below the skull base without stenosis. Left carotid system: Negative left CCA origin. Mild to moderate mostly calcified plaque at the posterior left ICA origin without stenosis. Tortuous left ICA distal to the bulb. Vertebral arteries: Negative proximal right subclavian artery and right vertebral  artery origin. Dominant appearing right vertebral artery with intermittent tortuosity is widely patent to the skull base. Left vertebral artery has an early origin from the subclavian without stenosis. Non dominant left vertebral has a slightly late entry to the cervical transverse foramen and is normal to the skull base without stenosis. CTA HEAD Posterior circulation: Tortuous distal vertebral arteries, the right is dominant. Minimal V4 segment calcified plaque without stenosis. Normal PICA origins. Patent, tortuous basilar artery without stenosis. Patent SCA and PCA origins. Tortuous left P1 segment. Posterior communicating arteries are diminutive or absent. Bilateral PCA branches are within normal limits. Anterior circulation: Both ICA siphons are patent. Mild supraclinoid calcified plaque greater on the left. No siphon stenosis. Normal ophthalmic artery origins. Patent carotid termini, MCA and ACA origins. Tortuous ACAs. Normal anterior communicating artery. Median artery of the corpus callosum (normal variant). Left MCA M1 segment and bifurcation are patent without stenosis. Left MCA branches are normal aside from mild tortuosity. Right MCA M1 segment is tortuous. Patent right MCA bifurcation without stenosis. Right MCA branches are within normal limits. Venous sinuses: Patent. Anatomic variants: Dominant right vertebral artery. Early origin of the left vertebral from the subclavian. Review of the MIP images confirms the above findings IMPRESSION: 1. Negative for large vessel occlusion. 2. Generalized arterial tortuosity in the head and neck. Mild for age atherosclerosis with no significant arterial stenosis. 3. Chronically advanced but nonspecific cerebral white matter changes, similar to a 2017 MRI. No acute intracranial abnormality. 4. Sequelae of remote left mastoid inflammation appears stable since 2017. Left tympanic cavity appears clear. 5. Emphysema (ICD10-J43.9). Electronically Signed   By: Odessa Fleming  M.D.   On: 08/24/2020 22:24   CT Angio Neck W and/or Wo Contrast  Result Date: 08/24/2020 CLINICAL DATA:  59 year old male with persistent, recurrent dizziness. Weakness and tingling in the left extremities for 2 days. EXAM: CT ANGIOGRAPHY HEAD AND NECK TECHNIQUE: Multidetector CT imaging of the head and neck was performed using the standard protocol during bolus administration of intravenous contrast. Multiplanar CT image reconstructions and MIPs were obtained to evaluate the vascular anatomy. Carotid stenosis measurements (when applicable) are obtained utilizing NASCET criteria, using the distal internal carotid diameter as the denominator. CONTRAST:  6mL OMNIPAQUE IOHEXOL 350 MG/ML SOLN COMPARISON:  Brain MRI 12/15/2015. FINDINGS: CT HEAD Brain: Mild Calcified atherosclerosis at the skull base. Cerebral volume is stable since 2017, normal for age. Scattered bilateral white matter hypodensity appears similar to the abnormal FLAIR signal in 2017. Superimposed dystrophic calcifications at the bilateral basal ganglia. No midline shift, ventriculomegaly, mass effect, evidence of mass lesion, intracranial hemorrhage or evidence of cortically based acute infarction. No cortical encephalomalacia identified. Calvarium and skull base:  No acute osseous abnormality identified. Paranasal sinuses: Chronic left mastoid effusion, coalescence. Other Visualized paranasal sinuses and mastoids are stable and well pneumatized. Orbits: Visualized orbits and scalp soft tissues are within normal limits. CTA NECK Skeleton: No acute osseous abnormality identified. Lower cervical disc and endplate degeneration including vacuum disc at C6-C7. Upper chest: Paraseptal emphysema in the right lung apex. Other neck: Negative. Aortic arch: 3 vessel arch configuration. Minor arch atherosclerosis. Right carotid system: Negative brachiocephalic artery. Mildly tortuous proximal right CCA. Mild soft and calcified plaque at the right ICA origin  and bulb. Tortuous right ICA below the skull base without stenosis. Left carotid system: Negative left CCA origin. Mild to moderate mostly calcified plaque at the posterior left ICA origin without stenosis. Tortuous left ICA distal to the bulb. Vertebral arteries: Negative proximal right subclavian artery and right vertebral artery origin. Dominant appearing right vertebral artery with intermittent tortuosity is widely patent to the skull base. Left vertebral artery has an early origin from the subclavian without stenosis. Non dominant left vertebral has a slightly late entry to the cervical transverse foramen and is normal to the skull base without stenosis. CTA HEAD Posterior circulation: Tortuous distal vertebral arteries, the right is dominant. Minimal V4 segment calcified plaque without stenosis. Normal PICA origins. Patent, tortuous basilar artery without stenosis. Patent SCA and PCA origins. Tortuous left P1 segment. Posterior communicating arteries are diminutive or absent. Bilateral PCA branches are within normal limits. Anterior circulation: Both ICA siphons are patent. Mild supraclinoid calcified plaque greater on the left. No siphon stenosis. Normal ophthalmic artery origins. Patent carotid termini, MCA and ACA origins. Tortuous ACAs. Normal anterior communicating artery. Median artery of the corpus callosum (normal variant). Left MCA M1 segment and bifurcation are patent without stenosis. Left MCA branches are normal aside from mild tortuosity. Right MCA M1 segment is tortuous. Patent right MCA bifurcation without stenosis. Right MCA branches are within normal limits. Venous sinuses: Patent. Anatomic variants: Dominant right vertebral artery. Early origin of the left vertebral from the subclavian. Review of the MIP images confirms the above findings IMPRESSION: 1. Negative for large vessel occlusion. 2. Generalized arterial tortuosity in the head and neck. Mild for age atherosclerosis with no  significant arterial stenosis. 3. Chronically advanced but nonspecific cerebral white matter changes, similar to a 2017 MRI. No acute intracranial abnormality. 4. Sequelae of remote left mastoid inflammation appears stable since 2017. Left tympanic cavity appears clear. 5. Emphysema (ICD10-J43.9). Electronically Signed   By: Odessa Fleming M.D.   On: 08/24/2020 22:24   MR BRAIN WO CONTRAST  Result Date: 08/25/2020 CLINICAL DATA:  Initial evaluation for neuro deficit, stroke suspected, left-sided weakness. EXAM: MRI HEAD WITHOUT CONTRAST TECHNIQUE: Multiplanar, multiecho pulse sequences of the brain and surrounding structures were obtained without intravenous contrast. COMPARISON:  Prior CTA from 08/24/2020. FINDINGS: Brain: Generalized age-related cerebral atrophy. Scattered patchy and confluent T2/FLAIR hyperintensity within the periventricular deep white matter both cerebral hemispheres felt to be most consistent with chronic small vessel ischemic disease, overall moderate in nature. Multiple scattered remote lacunar infarcts noted about the bilateral basal ganglia and hemispheric cerebral white matter. 1 cm acute ischemic infarct seen involving the right paramedian pons (series 5, image 65). No significant regional mass effect. Suspected associated faint petechial hemorrhage without hemorrhagic transformation (series 14, image 20). Additional punctate acute to subacute ischemic infarct noted within the deep white matter of the posterior right parietal centrum semi ovale (series 5, image 83). No other evidence for acute or subacute ischemia. Gray-white  matter differentiation otherwise maintained. No other acute intracranial hemorrhage. Few scattered subcentimeter foci of susceptibility artifacts seen involving the supratentorial brain, favored to be related to underlying hypertension. No mass lesion, midline shift or mass effect. No hydrocephalus or extra-axial fluid collection. Pituitary gland suprasellar region  normal. Midline structures intact. Vascular: Major intracranial vascular flow voids are maintained. Skull and upper cervical spine: Craniocervical junction normal. Bone marrow signal intensity within normal limits. No scalp soft tissue abnormality. Sinuses/Orbits: Globes and orbital soft tissues within normal limits. Paranasal sinuses are largely clear. Chronic appearing left mastoid effusion noted. Inner ear structures grossly normal. Other: None. IMPRESSION: 1. 1 cm acute ischemic infarct involving the right paramedian pons. Suspected associated faint petechial hemorrhage without frank hemorrhagic transformation or significant regional mass effect. 2. Additional punctate acute to subacute ischemic infarct involving the posterior right parietal centrum semi ovale. 3. Underlying age-related cerebral atrophy with moderate chronic microvascular ischemic disease, with multiple scattered remote lacunar infarcts about the bilateral basal ganglia and hemispheric cerebral white matter. 4. Multiple scattered chronic micro hemorrhages involving the supratentorial brain, favored to be related to poorly controlled hypertension. Cerebral amyloid angiopathy could also be considered, although is felt to be less likely. Electronically Signed   By: Rise Mu M.D.   On: 08/25/2020 02:30   ECHOCARDIOGRAM COMPLETE  Result Date: 08/25/2020    ECHOCARDIOGRAM REPORT   Patient Name:   Paul [name] Date of Exam: 08/25/2020 Medical Rec #:  [id number]   Height:       66.0 in Accession #:    [id number]  Weight:       136.7 lb Date of Birth:  Dec 08, 1960    BSA:          1.701 m Patient Age:    59 years    BP:           125/85 mmHg Patient Gender: M           HR:           52 bpm. Exam Location:  Inpatient Procedure: 2D Echo, Cardiac Doppler and Color Doppler Indications:    CVA  History:        Patient has no prior history of Echocardiogram examinations.                 Stroke, Arrythmias:Abnormal EKG; Risk Factors:Hypertension and                  Current Smoker. Alcohol abuse.  Sonographer:    Lavenia Atlas Referring Phys: [id number] VASUNDHRA RATHORE IMPRESSIONS  1. Left ventricular ejection fraction, by estimation, is 55 to 60%. The left ventricle has normal function. The left ventricle has no regional wall motion abnormalities. There is moderate left ventricular hypertrophy. Left ventricular diastolic parameters are consistent with Grade I diastolic dysfunction (impaired relaxation).  2. Right ventricular systolic function is normal. The right ventricular size is normal. Tricuspid regurgitation signal is inadequate for assessing PA pressure.  3. The mitral valve is normal in structure. Trivial mitral valve regurgitation.  4. The aortic valve was not well visualized. Aortic valve regurgitation is not visualized. No aortic stenosis is present. FINDINGS  Left Ventricle: Left ventricular ejection fraction, by estimation, is 55 to 60%. The left ventricle has normal function. The left ventricle has no regional wall motion abnormalities. The left ventricular internal cavity size was normal in size. There is  moderate left ventricular hypertrophy. Left ventricular diastolic parameters are consistent with Grade I diastolic dysfunction (impaired relaxation). Right Ventricle:  The right ventricular size is normal. No increase in right ventricular wall thickness. Right ventricular systolic function is normal. Tricuspid regurgitation signal is inadequate for assessing PA pressure. Left Atrium: Left atrial size was normal in size. Right Atrium: Right atrial size was normal in size. Pericardium: There is no evidence of pericardial effusion. Mitral Valve: The mitral valve is normal in structure. Trivial mitral valve regurgitation. Tricuspid Valve: The tricuspid valve is normal in structure. Tricuspid valve regurgitation is trivial. Aortic Valve: The aortic valve was not well visualized. Aortic valve regurgitation is not visualized. No aortic stenosis is  present. Pulmonic Valve: The pulmonic valve was not well visualized. Pulmonic valve regurgitation is not visualized. Aorta: The aortic root and ascending aorta are structurally normal, with no evidence of dilitation. IAS/Shunts: The interatrial septum was not well visualized.  LEFT VENTRICLE PLAX 2D LVIDd:         4.70 cm  Diastology LVIDs:         3.40 cm  LV e' medial:    4.13 cm/s LV PW:         1.30 cm  LV E/e' medial:  9.6 LV IVS:        1.40 cm  LV e' lateral:   6.31 cm/s LVOT diam:     2.20 cm  LV E/e' lateral: 6.3 LV SV:         49 LV SV Index:   29 LVOT Area:     3.80 cm  RIGHT VENTRICLE RV Basal diam:  2.70 cm RV S prime:     9.79 cm/s TAPSE (M-mode): 2.4 cm LEFT ATRIUM             Index       RIGHT ATRIUM           Index LA diam:        3.00 cm 1.76 cm/m  RA Area:     12.60 cm LA Vol (A2C):   30.4 ml 17.87 ml/m RA Volume:   23.80 ml  13.99 ml/m LA Vol (A4C):   54.7 ml 32.16 ml/m LA Biplane Vol: 44.8 ml 26.34 ml/m  AORTIC VALVE LVOT Vmax:   79.00 cm/s LVOT Vmean:  45.400 cm/s LVOT VTI:    0.129 m  AORTA Ao Root diam: 3.20 cm MITRAL VALVE MV Area (PHT): 2.01 cm    SHUNTS MV Decel Time: 377 msec    Systemic VTI:  0.13 m MV E velocity: 39.80 cm/s  Systemic Diam: 2.20 cm MV A velocity: 73.70 cm/s MV E/A ratio:  0.54 Epifanio Lesches MD Electronically signed by Epifanio Lesches MD Signature Date/Time: 08/25/2020/11:31:48 AM    Final     Scheduled Meds: .  stroke: mapping our early stages of recovery book   Does not apply Once  . atorvastatin  80 mg Oral Daily  . folic acid  1 mg Oral Daily  . [START ON 08/26/2020] influenza vac split quadrivalent PF  0.5 mL Intramuscular Tomorrow-1000  . multivitamin with minerals  1 tablet Oral Daily  . nicotine  14 mg Transdermal Daily  . [START ON 08/26/2020] pneumococcal 23 valent vaccine  0.5 mL Intramuscular Tomorrow-1000  . thiamine  100 mg Oral Daily   Or  . thiamine  100 mg Intravenous Daily    Continuous Infusions:   LOS: 0 days      Briant Cedar, MD [facility]  If 7PM-7AM, please contact night-coverage [email or web address] 08/25/2020, 6:36 PM

## 2020-08-26 LAB — CBC
HCT: 42.5 % (ref 39.0–52.0)
Hemoglobin: 13.3 g/dL (ref 13.0–17.0)
MCH: 23.2 pg — ABNORMAL LOW (ref 26.0–34.0)
MCHC: 31.3 g/dL (ref 30.0–36.0)
MCV: 74 fL — ABNORMAL LOW (ref 80.0–100.0)
Platelets: 173 10*3/uL (ref 150–400)
RBC: 5.74 MIL/uL (ref 4.22–5.81)
RDW: 15.7 % — ABNORMAL HIGH (ref 11.5–15.5)
WBC: 8 10*3/uL (ref 4.0–10.5)
nRBC: 0 % (ref 0.0–0.2)

## 2020-08-26 LAB — LIPID PANEL
Cholesterol: 169 mg/dL (ref 0–200)
HDL: 54 mg/dL (ref >40)
LDL Cholesterol: 103 mg/dL — ABNORMAL HIGH (ref 0–99)
Total CHOL/HDL Ratio: 3.1 RATIO
Triglycerides: 58 mg/dL (ref <150)
VLDL: 12 mg/dL (ref 0–40)

## 2020-08-26 LAB — BASIC METABOLIC PANEL
Anion gap: 10 (ref 5–15)
BUN: 18 mg/dL (ref 8–23)
CO2: 28 mmol/L (ref 22–32)
Calcium: 9.5 mg/dL (ref 8.9–10.3)
Chloride: 103 mmol/L (ref 98–111)
Creatinine, Ser: 0.95 mg/dL (ref 0.61–1.24)
GFR, Estimated: >60 mL/min (ref >60)
Glucose, Bld: 92 mg/dL (ref 70–99)
Potassium: 3.6 mmol/L (ref 3.5–5.1)
Sodium: 141 mmol/L (ref 135–145)

## 2020-08-26 LAB — GLUCOSE, CAPILLARY
Glucose-Capillary: 114 mg/dL — ABNORMAL HIGH (ref 70–99)
Glucose-Capillary: 138 mg/dL — ABNORMAL HIGH (ref 70–99)
Glucose-Capillary: 92 mg/dL (ref 70–99)

## 2020-08-26 LAB — HEMOGLOBIN A1C
Hgb A1c MFr Bld: 6.1 % — ABNORMAL HIGH (ref 4.8–5.6)
Mean Plasma Glucose: 128.37 mg/dL

## 2020-08-26 MED ORDER — ATORVASTATIN CALCIUM 80 MG PO TABS: 80.0000 mg | ORAL_TABLET | Freq: Every day | ORAL | 0 refills | Status: DC

## 2020-08-26 MED ORDER — AMLODIPINE BESYLATE 5 MG PO TABS: 5.0000 mg | ORAL_TABLET | Freq: Every day | ORAL | 0 refills | Status: DC

## 2020-08-26 MED ORDER — FLUTICASONE PROPIONATE 50 MCG/ACT NA SUSP: 2.0000 | Freq: Every day | NASAL | 0 refills | Status: DC

## 2020-08-26 MED ORDER — ASPIRIN EC 81 MG PO TBEC: 81.0000 mg | DELAYED_RELEASE_TABLET | Freq: Every day | ORAL | 0 refills | Status: AC

## 2020-08-26 MED ORDER — ALBUTEROL SULFATE HFA 108 (90 BASE) MCG/ACT IN AERS
1.0000 | INHALATION_SPRAY | RESPIRATORY_TRACT | 0 refills | Status: DC | PRN
Start: 2020-08-26 — End: 2022-02-20

## 2020-08-26 MED ORDER — CLOPIDOGREL BISULFATE 75 MG PO TABS: 75.0000 mg | ORAL_TABLET | Freq: Every day | ORAL | 0 refills | Status: AC

## 2020-08-26 NOTE — Evaluation (Signed)
Physical Therapy Evaluation Patient Details Name: Paul [name] MRN: [id number] DOB: [birth year] Today's Date: 08/26/2020   History of Present Illness  59 y.o. male with medical history significant of hypertension, BPH, tobacco use, alcohol use presenting to the ED with complaints of dizziness and left-sided weakness. 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  Clinical Impression  Pt presents to PT with deficits in strength, power, balance, and endurance. Pt with increased lateral drift when ambulating initially, does decrease with increased ambulation distance. Pt gait quality unchanged with use of cane at this time. Pt also reports dizziness for past few months. PT vestibular assessment negative with pursuits, saccades, VOR, and dix-hallpike not re-creating dizzy symptoms or resulting in nystagmus. Pt declines the need for stair negotiation assessment at this time. PT recommends discharge home with outpatient PT services, no current DME needs.    Follow Up Recommendations Outpatient PT;Supervision - Intermittent    Equipment Recommendations  None recommended by PT    Recommendations for Other Services       Precautions / Restrictions Precautions Precautions: Fall Restrictions Weight Bearing Restrictions: No      Mobility  Bed Mobility Overal bed mobility: Independent             General bed mobility comments: pt performs supine to sitting as well as supine to long sitting multiple times this session    Transfers Overall transfer level: Independent Equipment used: None;Straight cane             General transfer comment: pt performs 2 sit to stand transfers independently  Ambulation/Gait Ambulation/Gait assistance: Supervision Gait Distance (Feet): 240 Feet (120' without device, 73' with cane, 50'without device) Assistive device: Straight cane;None Gait Pattern/deviations: Step-through pattern;Drifts right/left Gait  velocity: reduced Gait velocity interpretation: 1.31 - 2.62 ft/sec, indicative of limited community ambulator General Gait Details: pt with increased lateral drift initially, gait quality improves during 2nd trial with cane and then with final trial without device  Stairs Stairs:  (pt declines the need for stair assessment)          Wheelchair Mobility    Modified Rankin (Stroke Patients Only) Modified Rankin (Stroke Patients Only) Pre-Morbid Rankin Score: No symptoms Modified Rankin: Moderately severe disability     Balance Overall balance assessment: Needs assistance Sitting-balance support: No upper extremity supported;Feet supported Sitting balance-Leahy Scale: Good     Standing balance support: No upper extremity supported;During functional activity Standing balance-Leahy Scale: Poor Standing balance comment: minG without UE support, SLS to adjust socks                             Pertinent Vitals/Pain Pain Assessment: No/denies pain    Home Living Family/patient expects to be discharged to:: Private residence Living Arrangements: Other relatives;Children (cousin, daughter) Available Help at Discharge: Family;Available PRN/intermittently Type of Home: House Home Access: Stairs to enter Entrance Stairs-Rails: None Entrance Stairs-Number of Steps: 3 Home Layout: Two level (pt reports typically sleeping in basement) Home Equipment: None      Prior Function Level of Independence: Independent         Comments: pt works as a Location manager, involves a lot of walking     Hand Dominance   Dominant Hand: Right    Extremity/Trunk Assessment   Upper Extremity Assessment Upper Extremity Assessment: Defer to OT evaluation    Lower Extremity Assessment Lower Extremity Assessment: LLE deficits/detail LLE Deficits / Details: grossly  4+/5    Cervical / Trunk Assessment Cervical / Trunk Assessment: Normal  Communication   Communication: No  difficulties  Cognition Arousal/Alertness: Awake/alert Behavior During Therapy: WFL for tasks assessed/performed Overall Cognitive Status: Within Functional Limits for tasks assessed                                        General Comments General comments (skin integrity, edema, etc.): VSS on RA    Exercises     Assessment/Plan    PT Assessment Patient needs continued PT services  PT Problem List Decreased balance;Decreased mobility;Decreased activity tolerance;Decreased strength       PT Treatment Interventions DME instruction;Gait training;Stair training;Balance training;Neuromuscular re-education;Therapeutic activities;Patient/family education    PT Goals (Current goals can be found in the Care Plan section)  Acute Rehab PT Goals Patient Stated Goal: To go home and eventually return to work PT Goal Formulation: With patient Time For Goal Achievement: 09/09/20 Potential to Achieve Goals: Good Additional Goals Additional Goal #1: Pt will score >19/24 on DGI to indicate a reduced risk for falls    Frequency Min 4X/week   Barriers to discharge        Co-evaluation               AM-PAC PT "6 Clicks" Mobility  Outcome Measure Help needed turning from your back to your side while in a flat bed without using bedrails?: None Help needed moving from lying on your back to sitting on the side of a flat bed without using bedrails?: None Help needed moving to and from a bed to a chair (including a wheelchair)?: None Help needed standing up from a chair using your arms (e.g., wheelchair or bedside chair)?: None Help needed to walk in hospital room?: A Little Help needed climbing 3-5 steps with a railing? : A Little 6 Click Score: 22    End of Session   Activity Tolerance: Patient tolerated treatment well Patient left: in bed;with call bell/phone within reach;with bed alarm set Nurse Communication: Mobility status PT Visit Diagnosis: Unsteadiness on feet  (R26.81);Other abnormalities of gait and mobility (R26.89)    Time: 1610-9604 PT Time Calculation (min) (ACUTE ONLY): 24 min   Charges:   PT Evaluation $PT Eval Low Complexity: 1 Low          Arlyss Gandy, PT, DPT Acute Rehabilitation Pager: [phone]   Arlyss Gandy 08/26/2020, 12:12 PM

## 2020-08-26 NOTE — Plan of Care (Signed)
  Problem: Education: Goal: Knowledge of disease or condition will improve 08/26/2020 1702 by Myriam Forehand, RN Outcome: Completed/Met 08/26/2020 1702 by Myriam Forehand, RN Outcome: Adequate for Discharge Goal: Knowledge of secondary prevention will improve 08/26/2020 1702 by Myriam Forehand, RN Outcome: Completed/Met 08/26/2020 1702 by Myriam Forehand, RN Outcome: Adequate for Discharge Goal: Knowledge of patient specific risk factors addressed and post discharge goals established will improve 08/26/2020 1702 by Myriam Forehand, RN Outcome: Completed/Met 08/26/2020 1702 by Myriam Forehand, RN Outcome: Adequate for Discharge   Problem: Self-Care: Goal: Ability to participate in self-care as condition permits will improve 08/26/2020 1702 by Myriam Forehand, RN Outcome: Completed/Met 08/26/2020 1702 by Myriam Forehand, RN Outcome: Adequate for Discharge   Problem: Spontaneous Subarachnoid Hemorrhage Tissue Perfusion: Goal: Complications of Spontaneous Subarachnoid Hemorrhage will be minimized 08/26/2020 1702 by Myriam Forehand, RN Outcome: Completed/Met 08/26/2020 1702 by Myriam Forehand, RN Outcome: Adequate for Discharge

## 2020-08-26 NOTE — TOC Transition Note (Signed)
Transition of Care [facility]) - CM/SW Discharge Note   Patient Details  Name: Paul [name] MRN: [id number] Date of Birth: June 26, 1961  [facility] CM/SW Contact:  Pollie Friar, RN Phone Number: 08/26/2020, 1:11 PM   Clinical Narrative:    Pt is discharging home with outpatient therapy. CM met with the patient and he is interested in [employer]. Orders in Epic and information on the AVS.  Pt without a PCP. CM has arranged a PCP appt and placed it on his AVS. Pt states his cousin he lives with can provide supervision.  CM provided the patient outpatient/ inpatient resources for alcohol counseling. Pt states he has transport home.   Final next level of care: OP Rehab Barriers to Discharge: No Barriers Identified   Patient Goals and CMS Choice     Choice offered to / list presented to : Patient  Discharge Placement                       Discharge Plan and Services                                     Social Determinants of Health (SDOH) Interventions     Readmission Risk Interventions No flowsheet data found.

## 2020-08-26 NOTE — Progress Notes (Signed)
STROKE TEAM PROGRESS NOTE    INTERVAL HISTORY Patient is sitting up in bed.  He states is doing better.  Speech is improved.  He is being evaluated for therapy.  Echocardiogram shows ejection fraction 55 to 60% and lipid profile shows LDL of 103 and A1c is 6.1.  No new complaints.  Neurological exam is unchanged.  Vital signs are stable.    OBJECTIVE Vitals:   08/26/20 0015 08/26/20 0409 08/26/20 0902 08/26/20 1121  BP: (!) 144/86 (!) 135/92 (!) 140/95 (!) 136/96  Pulse: (!) 57 60 (!) 53 (!) 57  Resp: 18 15 14 16   Temp: 98.8 F (37.1 C) 98.4 F (36.9 C) 97.8 F (36.6 C) 98.2 F (36.8 C)  TempSrc: Oral Oral Oral Oral  SpO2: 98% 100% 95% 99%  Weight:      Height:        CBC:  Recent Labs  Lab 08/24/20 1820 08/24/20 1828 08/26/20 0410  WBC 8.5  --  8.0  NEUTROABS 6.4  --   --   HGB 14.1 16.0 13.3  HCT 45.0 47.0 42.5  MCV 74.9*  --  74.0*  PLT 186  --  173    Basic Metabolic Panel:  Recent Labs  Lab 08/24/20 1820 08/24/20 1828 08/25/20 0608 08/26/20 0410  NA 139 143  --  141  K 3.3* 3.2*  --  3.6  CL 103 104  --  103  CO2 25  --   --  28  GLUCOSE 225* 226*  --  92  BUN 15 15  --  18  CREATININE 0.87 0.90  --  0.95  CALCIUM 9.3  --   --  9.5  MG  --   --  1.9  --   PHOS  --   --  3.1  --     Lipid Panel:     Component Value Date/Time   CHOL 169 08/26/2020 0410   TRIG 58 08/26/2020 0410   HDL 54 08/26/2020 0410   CHOLHDL 3.1 08/26/2020 0410   VLDL 12 08/26/2020 0410   LDLCALC 103 (H) 08/26/2020 0410   HgbA1c:  Lab Results  Component Value Date   HGBA1C 6.1 (H) 08/26/2020   Urine Drug Screen: No results found for: LABOPIA, COCAINSCRNUR, LABBENZ, AMPHETMU, THCU, LABBARB  Alcohol Level No results found for: ETH  IMAGING  CT Angio Head W or Wo Contrast  CT Angio Neck W and/or Wo Contrast 08/24/2020 IMPRESSION:  1. Negative for large vessel occlusion.  2. Generalized arterial tortuosity in the head and neck. Mild for age atherosclerosis with no  significant arterial stenosis.  3. Chronically advanced but nonspecific cerebral white matter changes, similar to a 2017 MRI. No acute intracranial abnormality.  4. Sequelae of remote left mastoid inflammation appears stable since 2017. Left tympanic cavity appears clear.  5. Emphysema (ICD10-J43.9).   MR BRAIN WO CONTRAST 08/25/2020 IMPRESSION:  1. 1 cm acute ischemic infarct involving the right paramedian pons. Suspected associated faint petechial hemorrhage without frank hemorrhagic transformation or significant regional mass effect.  2. Additional punctate acute to subacute ischemic infarct involving the posterior right parietal centrum semi ovale.  3. Underlying age-related cerebral atrophy with moderate chronic microvascular ischemic disease, with multiple scattered remote lacunar infarcts about the bilateral basal ganglia and hemispheric cerebral white matter.  4. Multiple scattered chronic micro hemorrhages involving the supratentorial brain, favored to be related to poorly controlled hypertension. Cerebral amyloid angiopathy could also be considered, although is felt to be less likely.  Transthoracic Echocardiogram  00/00/2021 Pending  ECG - SR rate 83 BPM. (See cardiology reading for complete details)   PHYSICAL EXAM Blood pressure (!) 136/96, pulse (!) 57, temperature 98.2 F (36.8 C), temperature source Oral, resp. rate 16, height 5\' 6"  (1.676 m), weight 73 kg, SpO2 99 %. Frail middle-age Asian male not in distress. . Afebrile. Head is nontraumatic. Neck is supple without bruit.    Cardiac exam no murmur or gallop. Lungs are clear to auscultation. Distal pulses are well felt.  Neurological Exam ; Awake alert oriented x 3 normal speech and language. Mild left lower face asymmetry. Tongue midline. Mild left upper and lower extremity drift. Mild diminished fine finger movements on left. Orbits right over left upper extremity. Mild left grip and hip flexor weakness.. Normal sensation  . Normal coordination.      ASSESSMENT/PLAN [name] is a 59 y.o. male with history of BPH, tobacco use, alcohol use and HTN with complaints ofdizziness and left-sided weakness. He did not receive IV t-PA due to late presentation (>4.5 hours from time of onset).  Stroke: acute ischemic infarct involving the right paramedian pons with punctate acute to subacute ischemic infarct involving the posterior right parietal centrum semi ovale - embolic vs small vessel disease.   Resultant left hemiparesis  Code Stroke CT Head - not ordered  CT head - not ordered  MRI head - 1 cm acute ischemic infarct involving the right paramedian pons. Suspected associated faint petechial hemorrhage without frank hemorrhagic transformation or significant regional mass effect. Additional punctate acute to subacute ischemic infarct involving the posterior right parietal centrum semi ovale. Underlying age-related cerebral atrophy with moderate chronic microvascular ischemic disease, with multiple scattered remote lacunar infarcts about the bilateral basal ganglia and hemispheric cerebral white matter. Multiple scattered chronic micro hemorrhages involving the supratentorial brain, favored to be related to poorly controlled hypertension. Cerebral amyloid angiopathy could also be considered, although is felt to be less likely.   MRA head - not ordered  CTA H&N - Negative for large vessel occlusion. Generalized arterial tortuosity in the head and neck. Mild for age atherosclerosis with no significant arterial stenosis. Chronically advanced but nonspecific cerebral white matter changes, similar to a 2017 MRI. No acute intracranial abnormality.  CT Perfusion - not ordered  Carotid Doppler - CTA neck ordered - carotid dopplers not indicated.  2D Echo - pending  2018 Virus 2 - negative  LDL - pending  HgbA1c - pending  UDS - not ordered  VTE prophylaxis - SCDs Diet  Diet Order            Diet -  low sodium heart healthy           Diet heart healthy/carb modified Room service appropriate? Yes; Fluid consistency: Thin  Diet effective now                 No antithrombotic prior to admission, now on aspirin 81 mg and Plavix 75 mg daily for 3 weeks followed by aspirin 81 mg daily alone   patient will be counseled to be compliant with his antithrombotic medications  Ongoing aggressive stroke risk factor management  Therapy recommendations:  pending  Disposition:  Pending  Hypertension  Home BP meds: Norvasc  Current BP meds: none   Stable . Permissive hypertension (OK if < 220/120) but gradually normalize in 5-7 days  . Long-term BP goal normotensive  Hyperlipidemia  Home Lipid lowering medication: none   LDL 103 mg percent.  Current lipid lowering medication: Lipitor 80 mg daily   Continue statin at discharge  Diabetes  Home diabetic meds: none   Current diabetic meds: none  HgbA1c pending, goal < 7.0 Recent Labs    08/25/20 2108 08/26/20 0619 08/26/20 1119  GLUCAP 190* 114* 138*    Other Stroke Risk Factors  Advanced age  Cigarette smoker - advised to stop smoking  ETOH use, advised to drink no more than 1 alcoholic beverage per day.  Other Active Problems  Code status - Full code  NPO   Emphysema (EAV40-J81.9)  Hypokalemia - potassium - 3.2 - supplement ordered  Hyperglycemia - await HgbA1C  Mild bradycardia - TSH recommended by Dr Thomasena Edis  ETOH hx - thiamine ; Ativan ; folic acid  Hospital day # 1 He presented with subacute symptoms of dizziness as well as left hemiparesis due to right pontine and subcortical lacunar infarct from small vessel disease. Recommend aspirin 81 mg daily and Plavix 75 mg for 3 weeks followed by aspirin alone and aggressive risk factor modification. Mobilize out of bed. Therapy and rehab consults. Greater than 50% time during this 25-minute visit was spent in counseling and coordination of care about his  lacunar stroke and discussion about stroke prevention and treatment and discussion with care team. Discussed with Dr. Sharolyn Douglas.  Stroke team will sign off.  Follow-up as an outpatient stroke clinic in 6 weeks.  Kindly call for questions if any. Delia Heady, MD  To contact Stroke Continuity provider, please refer to [email or web address]. After hours, contact General Neurology

## 2020-08-26 NOTE — Progress Notes (Signed)
Patient for discharge home today,  Vital signs stable ,  Denies any pain or discomfort .  IV and telemery discontinued,  AVS discussed,  Excuse letter for his employer given to patient.  Awating ride from a friend to get home.

## 2020-08-26 NOTE — Plan of Care (Signed)
  Problem: Education: Goal: Knowledge of disease or condition will improve Outcome: Adequate for Discharge Goal: Knowledge of secondary prevention will improve Outcome: Adequate for Discharge Goal: Knowledge of patient specific risk factors addressed and post discharge goals established will improve Outcome: Adequate for Discharge   Problem: Self-Care: Goal: Ability to participate in self-care as condition permits will improve Outcome: Adequate for Discharge   Problem: Spontaneous Subarachnoid Hemorrhage Tissue Perfusion: Goal: Complications of Spontaneous Subarachnoid Hemorrhage will be minimized Outcome: Adequate for Discharge

## 2020-08-26 NOTE — Discharge Summary (Addendum)
Discharge Summary  Paul [name] RKY:[id number] DOB: Feb 02, 1961  PCP: Patient, No Pcp Per  Admit date: 08/24/2020 Discharge date: 08/26/2020  Time spent: 40 mins  Recommendations for Outpatient Follow-up:  1. PCP in 1 week 2. Neurology as scheduled    Discharge Diagnoses:  Active Hospital Problems   Diagnosis Date Noted  . Acute CVA (cerebrovascular accident) (HCC) 08/25/2020  . HTN (hypertension) 08/25/2020  . BPH (benign prostatic hyperplasia) 08/25/2020  . Tobacco use 08/25/2020  . Alcohol use 08/25/2020    Resolved Hospital Problems  No resolved problems to display.    Discharge Condition: Stable  Diet recommendation: Heart healthy/mod carb  Vitals:   08/26/20 1121 08/26/20 1520  BP: (!) 136/96 (!) 144/85  Pulse: (!) 57 62  Resp: 16 18  Temp: 98.2 F (36.8 C) 98.6 F (37 C)  SpO2: 99% 98%    History of present illness:  Paul [name] a 59 y.o.malewith medical history significant ofhypertension, BPH, tobacco use, alcohol use presenting to the ED with complaints ofdizziness and left-sided weakness. Patient reports 1 month history of dizziness/lightheadedness and nausea. He has been feeling "off balance"when he tries to walk. States for the past 4 days he has noticed that his left arm and leg are weak. He has had some numbness in his fingers in both hands. States he bought aspirin 81 mg from the pharmacy and started taking it 2 days ago. States "I don'ttake medicines unless I have to." Denies history of prior stroke. In the ED, Blood pressure elevated with systolic up to 170s. Remainder of vital signs stable. Labs fairly stable. Screening SARS-CoV-2 PCR test and influenza panelbothnegative. CTA head and neck negative for LVO or acute intracranial abnormality. 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 Suspected associated faint petechial hemorrhage without frank hemorrhagic transformation or significant regional  mass-effect. Additional punctate acute to subacute ischemic infarct involving the posterior right parietal centrum semi ovale. Neurology consulted by ED provider.  Patient admitted for further management.     Today, patient denied any new complaints, denies any further dizziness, weakness, chest pain, no abdominal pain, no nausea no vomiting.  Patient advised to be compliant with his medications and follow-up appointment.   Hospital Course:  Principal Problem:   Acute CVA (cerebrovascular accident) (HCC) Active Problems:   HTN (hypertension)   BPH (benign prostatic hyperplasia)   Tobacco use   Alcohol use   Acute CVA Brain MRI showing a 1 cm acute ischemic infarct involving the right paramedian pons. Suspected associated faint petechial hemorrhage without frank hemorrhagic transformation or significant regional mass-effect. Additional punctate acute to subacute ischemic infarct involving the posterior right parietal centrum semi ovale LDL 103, A1c 6.1 Echo showed EF of 55 to 60%, no regional wall motion abnormality, grade 1 diastolic dysfunction, inter atrial septum was not well visualized Neurology consulted, recommend to discharge on aspirin and Plavix for 3 weeks, and then aspirin alone.  Recommend outpatient follow-up Continue statins, aspirin, Plavix PT/OT/SLP-outpatient PT  Hypertension Resume amlodipine 5 mg on discharge Follow-up with PCP  Tobacco use Advised to quit  Alcohol use disorder No signs of acute withdrawal at this time. S/P CIWA protocol Advised to quit  Mild hypokalemia Resolved  Abnormal EKG EKG showing T wave inversions in inferior leads and slight ST elevations in anterior leads. No prior tracing for comparison. Patient is not endorsing any anginal symptoms. Troponin negative Echo as above PCP follow-up  Prediabetes with hyperglycemia A1c 6.1 Advised patient on diet  control, exercise Follow-up with PCP with close glucose monitoring        Malnutrition Type:      Malnutrition Characteristics:      Nutrition Interventions:      Estimated body mass index is 25.98 kg/m as calculated from the following:   Height as of this encounter: 5\' 6"  (1.676 m).   Weight as of this encounter: 73 kg.    Procedures:  None  Consultations:  Neurology  Discharge Exam: BP (!) 144/85 (BP Location: Right Arm)   Pulse 62   Temp 98.6 F (37 C) (Oral)   Resp 18   Ht 5\' 6"  (1.676 m)   Wt 73 kg   SpO2 98%   BMI 25.98 kg/m   General: NAD Cardiovascular: S1, S2 present Respiratory: CTA B Neurology: Strength equal in all extremities, no obvious focal neurologic deficits noted    Discharge Instructions You were cared for by a hospitalist during your hospital stay. If you have any questions about your discharge medications or the care you received while you were in the hospital after you are discharged, you can call the unit and asked to speak with the hospitalist on call if the hospitalist that took care of you is not available. Once you are discharged, your primary care physician will handle any further medical issues. Please note that NO REFILLS for any discharge medications will be authorized once you are discharged, as it is imperative that you return to your primary care physician (or establish a relationship with a primary care physician if you do not have one) for your aftercare needs so that they can reassess your need for medications and monitor your lab values.  Discharge Instructions    Ambulatory referral to Physical Therapy   Complete by: As directed    Diet - low sodium heart healthy   Complete by: As directed    Increase activity slowly   Complete by: As directed      Allergies as of 08/26/2020   No Known Allergies     Medication List    STOP taking these medications   meclizine 25 MG tablet Commonly known as: ANTIVERT     TAKE these medications   AeroChamber Plus inhaler Use with inhaler    albuterol 108 (90 Base) MCG/ACT inhaler Commonly known as: VENTOLIN HFA Inhale 1-2 puffs into the lungs every 4 (four) hours as needed for wheezing or shortness of breath.   amLODipine 5 MG tablet Commonly known as: NORVASC Take 1 tablet (5 mg total) by mouth daily.   aspirin EC 81 MG tablet Take 1 tablet (81 mg total) by mouth daily. Swallow whole.   atorvastatin 80 MG tablet Commonly known as: LIPITOR Take 1 tablet (80 mg total) by mouth daily. Start taking on: August 27, 2020   clopidogrel 75 MG tablet Commonly known as: PLAVIX Take 1 tablet (75 mg total) by mouth daily for 21 days. Start taking on: August 27, 2020   fluticasone 50 MCG/ACT nasal spray Commonly known as: FLONASE Place 2 sprays into both nostrils daily.   MAGNESIUM PO Take 1 tablet by mouth daily.   OMEGA 3 PO Take 1 capsule by mouth daily.   REFRESH OP Place 1 drop into both eyes daily as needed (For dry eyes.).   VITAMIN B-12 PO Take 1 tablet by mouth daily.   VITAMIN D PO Take 1 tablet by mouth daily.      No Known Allergies  Follow-up Information    [facility] Follow up on 09/14/2020.   Specialty: Family Medicine Why: Your appointment is at 9:30. Please arrive early and bring insurance card, picture ID, current meds Contact information: [address] [place] [id number] [phone]       [facility] Follow up.   Specialty: Rehabilitation Why: The outpatient therapy will contact you for the first appointment. Contact information: [address] [id number] mc [place] [address] [phone]       [facility]. Schedule an appointment as soon as possible for a visit in 6 week(s).   Specialty: Neurology Contact information: [address] [place] [address] [phone]               The results of significant  diagnostics from this hospitalization (including imaging, microbiology, ancillary and laboratory) are listed below for reference.    Significant Diagnostic Studies: CT Angio Head W or Wo Contrast  Result Date: 08/24/2020 CLINICAL DATA:  59 year old male with persistent, recurrent dizziness. Weakness and tingling in the left extremities for 2 days. EXAM: CT ANGIOGRAPHY HEAD AND NECK TECHNIQUE: Multidetector CT imaging of the head and neck was performed using the standard protocol during bolus administration of intravenous contrast. Multiplanar CT image reconstructions and MIPs were obtained to evaluate the vascular anatomy. Carotid stenosis measurements (when applicable) are obtained utilizing NASCET criteria, using the distal internal carotid diameter as the denominator. CONTRAST:  75mL OMNIPAQUE IOHEXOL 350 MG/ML SOLN COMPARISON:  Brain MRI 12/15/2015. FINDINGS: CT HEAD Brain: Mild Calcified atherosclerosis at the skull base. Cerebral volume is stable since 2017, normal for age. Scattered bilateral white matter hypodensity appears similar to the abnormal FLAIR signal in 2017. Superimposed dystrophic calcifications at the bilateral basal ganglia. No midline shift, ventriculomegaly, mass effect, evidence of mass lesion, intracranial hemorrhage or evidence of cortically based acute infarction. No cortical encephalomalacia identified. Calvarium and skull base: No acute osseous abnormality identified. Paranasal sinuses: Chronic left mastoid effusion, coalescence. Other Visualized paranasal sinuses and mastoids are stable and well pneumatized. Orbits: Visualized orbits and scalp soft tissues are within normal limits. CTA NECK Skeleton: No acute osseous abnormality identified. Lower cervical disc and endplate degeneration including vacuum disc at C6-C7. Upper chest: Paraseptal emphysema in the right lung apex. Other neck: Negative. Aortic arch: 3 vessel arch configuration. Minor arch atherosclerosis. Right carotid  system: Negative brachiocephalic artery. Mildly tortuous proximal right CCA. Mild soft and calcified plaque at the right ICA origin and bulb. Tortuous right ICA below the skull base without stenosis. Left carotid system: Negative left CCA origin. Mild to moderate mostly calcified plaque at the posterior left ICA origin without stenosis. Tortuous left ICA distal to the bulb. Vertebral arteries: Negative proximal right subclavian artery and right vertebral artery origin. Dominant appearing right vertebral artery with intermittent tortuosity is widely patent to the skull base. Left vertebral artery has an early origin from the subclavian without stenosis. Non dominant left vertebral has a slightly late entry to the cervical transverse foramen and is normal to the skull base without stenosis. CTA HEAD Posterior circulation: Tortuous distal vertebral arteries, the right is dominant. Minimal V4 segment calcified plaque without stenosis. Normal PICA origins. Patent, tortuous basilar artery without stenosis. Patent SCA and PCA origins. Tortuous left P1 segment. Posterior communicating arteries are diminutive or absent. Bilateral PCA branches are within normal limits. Anterior circulation: Both ICA siphons are patent. Mild supraclinoid calcified plaque greater on the left. No siphon stenosis. Normal ophthalmic artery origins. Patent  carotid termini, MCA and ACA origins. Tortuous ACAs. Normal anterior communicating artery. Median artery of the corpus callosum (normal variant). Left MCA M1 segment and bifurcation are patent without stenosis. Left MCA branches are normal aside from mild tortuosity. Right MCA M1 segment is tortuous. Patent right MCA bifurcation without stenosis. Right MCA branches are within normal limits. Venous sinuses: Patent. Anatomic variants: Dominant right vertebral artery. Early origin of the left vertebral from the subclavian. Review of the MIP images confirms the above findings IMPRESSION: 1. Negative  for large vessel occlusion. 2. Generalized arterial tortuosity in the head and neck. Mild for age atherosclerosis with no significant arterial stenosis. 3. Chronically advanced but nonspecific cerebral white matter changes, similar to a 2017 MRI. No acute intracranial abnormality. 4. Sequelae of remote left mastoid inflammation appears stable since 2017. Left tympanic cavity appears clear. 5. Emphysema (ICD10-J43.9). Electronically Signed   By: Odessa Fleming M.D.   On: 08/24/2020 22:24   DG Chest 2 View  Result Date: 08/04/2020 CLINICAL DATA:  Lung rales.  Concern for pneumonia. EXAM: CHEST - 2 VIEW COMPARISON:  03/09/2019 FINDINGS: Cardiac silhouette is normal in size. Normal mediastinal and hilar contours. Lungs are mildly hyperexpanded, but clear. No pleural effusion or pneumothorax. Skeletal structures are intact. IMPRESSION: No active cardiopulmonary disease. Electronically Signed   By: Amie Portland M.D.   On: 08/04/2020 16:03   CT Angio Neck W and/or Wo Contrast  Result Date: 08/24/2020 CLINICAL DATA:  59 year old male with persistent, recurrent dizziness. Weakness and tingling in the left extremities for 2 days. EXAM: CT ANGIOGRAPHY HEAD AND NECK TECHNIQUE: Multidetector CT imaging of the head and neck was performed using the standard protocol during bolus administration of intravenous contrast. Multiplanar CT image reconstructions and MIPs were obtained to evaluate the vascular anatomy. Carotid stenosis measurements (when applicable) are obtained utilizing NASCET criteria, using the distal internal carotid diameter as the denominator. CONTRAST:  75mL OMNIPAQUE IOHEXOL 350 MG/ML SOLN COMPARISON:  Brain MRI 12/15/2015. FINDINGS: CT HEAD Brain: Mild Calcified atherosclerosis at the skull base. Cerebral volume is stable since 2017, normal for age. Scattered bilateral white matter hypodensity appears similar to the abnormal FLAIR signal in 2017. Superimposed dystrophic calcifications at the bilateral basal  ganglia. No midline shift, ventriculomegaly, mass effect, evidence of mass lesion, intracranial hemorrhage or evidence of cortically based acute infarction. No cortical encephalomalacia identified. Calvarium and skull base: No acute osseous abnormality identified. Paranasal sinuses: Chronic left mastoid effusion, coalescence. Other Visualized paranasal sinuses and mastoids are stable and well pneumatized. Orbits: Visualized orbits and scalp soft tissues are within normal limits. CTA NECK Skeleton: No acute osseous abnormality identified. Lower cervical disc and endplate degeneration including vacuum disc at C6-C7. Upper chest: Paraseptal emphysema in the right lung apex. Other neck: Negative. Aortic arch: 3 vessel arch configuration. Minor arch atherosclerosis. Right carotid system: Negative brachiocephalic artery. Mildly tortuous proximal right CCA. Mild soft and calcified plaque at the right ICA origin and bulb. Tortuous right ICA below the skull base without stenosis. Left carotid system: Negative left CCA origin. Mild to moderate mostly calcified plaque at the posterior left ICA origin without stenosis. Tortuous left ICA distal to the bulb. Vertebral arteries: Negative proximal right subclavian artery and right vertebral artery origin. Dominant appearing right vertebral artery with intermittent tortuosity is widely patent to the skull base. Left vertebral artery has an early origin from the subclavian without stenosis. Non dominant left vertebral has a slightly late entry to the cervical transverse foramen and is normal to  the skull base without stenosis. CTA HEAD Posterior circulation: Tortuous distal vertebral arteries, the right is dominant. Minimal V4 segment calcified plaque without stenosis. Normal PICA origins. Patent, tortuous basilar artery without stenosis. Patent SCA and PCA origins. Tortuous left P1 segment. Posterior communicating arteries are diminutive or absent. Bilateral PCA branches are within  normal limits. Anterior circulation: Both ICA siphons are patent. Mild supraclinoid calcified plaque greater on the left. No siphon stenosis. Normal ophthalmic artery origins. Patent carotid termini, MCA and ACA origins. Tortuous ACAs. Normal anterior communicating artery. Median artery of the corpus callosum (normal variant). Left MCA M1 segment and bifurcation are patent without stenosis. Left MCA branches are normal aside from mild tortuosity. Right MCA M1 segment is tortuous. Patent right MCA bifurcation without stenosis. Right MCA branches are within normal limits. Venous sinuses: Patent. Anatomic variants: Dominant right vertebral artery. Early origin of the left vertebral from the subclavian. Review of the MIP images confirms the above findings IMPRESSION: 1. Negative for large vessel occlusion. 2. Generalized arterial tortuosity in the head and neck. Mild for age atherosclerosis with no significant arterial stenosis. 3. Chronically advanced but nonspecific cerebral white matter changes, similar to a 2017 MRI. No acute intracranial abnormality. 4. Sequelae of remote left mastoid inflammation appears stable since 2017. Left tympanic cavity appears clear. 5. Emphysema (ICD10-J43.9). Electronically Signed   By: Odessa FlemingH  Hall M.D.   On: 08/24/2020 22:24   MR BRAIN WO CONTRAST  Result Date: 08/25/2020 CLINICAL DATA:  Initial evaluation for neuro deficit, stroke suspected, left-sided weakness. EXAM: MRI HEAD WITHOUT CONTRAST TECHNIQUE: Multiplanar, multiecho pulse sequences of the brain and surrounding structures were obtained without intravenous contrast. COMPARISON:  Prior CTA from 08/24/2020. FINDINGS: Brain: Generalized age-related cerebral atrophy. Scattered patchy and confluent T2/FLAIR hyperintensity within the periventricular deep white matter both cerebral hemispheres felt to be most consistent with chronic small vessel ischemic disease, overall moderate in nature. Multiple scattered remote lacunar infarcts  noted about the bilateral basal ganglia and hemispheric cerebral white matter. 1 cm acute ischemic infarct seen involving the right paramedian pons (series 5, image 65). No significant regional mass effect. Suspected associated faint petechial hemorrhage without hemorrhagic transformation (series 14, image 20). Additional punctate acute to subacute ischemic infarct noted within the deep white matter of the posterior right parietal centrum semi ovale (series 5, image 83). No other evidence for acute or subacute ischemia. Gray-white matter differentiation otherwise maintained. No other acute intracranial hemorrhage. Few scattered subcentimeter foci of susceptibility artifacts seen involving the supratentorial brain, favored to be related to underlying hypertension. No mass lesion, midline shift or mass effect. No hydrocephalus or extra-axial fluid collection. Pituitary gland suprasellar region normal. Midline structures intact. Vascular: Major intracranial vascular flow voids are maintained. Skull and upper cervical spine: Craniocervical junction normal. Bone marrow signal intensity within normal limits. No scalp soft tissue abnormality. Sinuses/Orbits: Globes and orbital soft tissues within normal limits. Paranasal sinuses are largely clear. Chronic appearing left mastoid effusion noted. Inner ear structures grossly normal. Other: None. IMPRESSION: 1. 1 cm acute ischemic infarct involving the right paramedian pons. Suspected associated faint petechial hemorrhage without frank hemorrhagic transformation or significant regional mass effect. 2. Additional punctate acute to subacute ischemic infarct involving the posterior right parietal centrum semi ovale. 3. Underlying age-related cerebral atrophy with moderate chronic microvascular ischemic disease, with multiple scattered remote lacunar infarcts about the bilateral basal ganglia and hemispheric cerebral white matter. 4. Multiple scattered chronic micro hemorrhages  involving the supratentorial brain, favored to be related to poorly  controlled hypertension. Cerebral amyloid angiopathy could also be considered, although is felt to be less likely. Electronically Signed   By: Rise Mu M.D.   On: 08/25/2020 02:30   ECHOCARDIOGRAM COMPLETE  Result Date: 08/25/2020    ECHOCARDIOGRAM REPORT   Patient Name:   Paul [name] Date of Exam: 08/25/2020 Medical Rec #:  [id number]   Height:       66.0 in Accession #:    [id number]  Weight:       136.7 lb Date of Birth:  [birth year]    BSA:          1.701 m Patient Age:    59 years    BP:           125/85 mmHg Patient Gender: M           HR:           52 bpm. Exam Location:  Inpatient Procedure: 2D Echo, Cardiac Doppler and Color Doppler Indications:    CVA  History:        Patient has no prior history of Echocardiogram examinations.                 Stroke, Arrythmias:Abnormal EKG; Risk Factors:Hypertension and                 Current Smoker. Alcohol abuse.  Sonographer:    Lavenia Atlas Referring Phys: [id number] VASUNDHRA RATHORE IMPRESSIONS  1. Left ventricular ejection fraction, by estimation, is 55 to 60%. The left ventricle has normal function. The left ventricle has no regional wall motion abnormalities. There is moderate left ventricular hypertrophy. Left ventricular diastolic parameters are consistent with Grade I diastolic dysfunction (impaired relaxation).  2. Right ventricular systolic function is normal. The right ventricular size is normal. Tricuspid regurgitation signal is inadequate for assessing PA pressure.  3. The mitral valve is normal in structure. Trivial mitral valve regurgitation.  4. The aortic valve was not well visualized. Aortic valve regurgitation is not visualized. No aortic stenosis is present. FINDINGS  Left Ventricle: Left ventricular ejection fraction, by estimation, is 55 to 60%. The left ventricle has normal function. The left ventricle has no regional wall motion abnormalities. The left  ventricular internal cavity size was normal in size. There is  moderate left ventricular hypertrophy. Left ventricular diastolic parameters are consistent with Grade I diastolic dysfunction (impaired relaxation). Right Ventricle: The right ventricular size is normal. No increase in right ventricular wall thickness. Right ventricular systolic function is normal. Tricuspid regurgitation signal is inadequate for assessing PA pressure. Left Atrium: Left atrial size was normal in size. Right Atrium: Right atrial size was normal in size. Pericardium: There is no evidence of pericardial effusion. Mitral Valve: The mitral valve is normal in structure. Trivial mitral valve regurgitation. Tricuspid Valve: The tricuspid valve is normal in structure. Tricuspid valve regurgitation is trivial. Aortic Valve: The aortic valve was not well visualized. Aortic valve regurgitation is not visualized. No aortic stenosis is present. Pulmonic Valve: The pulmonic valve was not well visualized. Pulmonic valve regurgitation is not visualized. Aorta: The aortic root and ascending aorta are structurally normal, with no evidence of dilitation. IAS/Shunts: The interatrial septum was not well visualized.  LEFT VENTRICLE PLAX 2D LVIDd:         4.70 cm  Diastology LVIDs:         3.40 cm  LV e' medial:    4.13 cm/s LV PW:         1.30 cm  LV  E/e' medial:  9.6 LV IVS:        1.40 cm  LV e' lateral:   6.31 cm/s LVOT diam:     2.20 cm  LV E/e' lateral: 6.3 LV SV:         49 LV SV Index:   29 LVOT Area:     3.80 cm  RIGHT VENTRICLE RV Basal diam:  2.70 cm RV S prime:     9.79 cm/s TAPSE (M-mode): 2.4 cm LEFT ATRIUM             Index       RIGHT ATRIUM           Index LA diam:        3.00 cm 1.76 cm/m  RA Area:     12.60 cm LA Vol (A2C):   30.4 ml 17.87 ml/m RA Volume:   23.80 ml  13.99 ml/m LA Vol (A4C):   54.7 ml 32.16 ml/m LA Biplane Vol: 44.8 ml 26.34 ml/m  AORTIC VALVE LVOT Vmax:   79.00 cm/s LVOT Vmean:  45.400 cm/s LVOT VTI:    0.129 m   AORTA Ao Root diam: 3.20 cm MITRAL VALVE MV Area (PHT): 2.01 cm    SHUNTS MV Decel Time: 377 msec    Systemic VTI:  0.13 m MV E velocity: 39.80 cm/s  Systemic Diam: 2.20 cm MV A velocity: 73.70 cm/s MV E/A ratio:  0.54 Epifanio Lesches MD Electronically signed by Epifanio Lesches MD Signature Date/Time: 08/25/2020/11:31:48 AM    Final     Microbiology: Recent Results (from the past 240 hour(s))  Resp Panel by RT-PCR (Flu A&B, Covid) Nasopharyngeal Swab     Status: None   Collection Time: 08/24/20 11:03 PM   Specimen: Nasopharyngeal Swab; Nasopharyngeal(NP) swabs in vial transport medium  Result Value Ref Range Status   SARS Coronavirus 2 by RT PCR NEGATIVE NEGATIVE Final    Comment: (NOTE) SARS-CoV-2 target nucleic acids are NOT DETECTED.  The SARS-CoV-2 RNA is generally detectable in upper respiratory specimens during the acute phase of infection. The lowest concentration of SARS-CoV-2 viral copies this assay can detect is 138 copies/mL. A negative result does not preclude SARS-Cov-2 infection and should not be used as the sole basis for treatment or other patient management decisions. A negative result may occur with  improper specimen collection/handling, submission of specimen other than nasopharyngeal swab, presence of viral mutation(s) within the areas targeted by this assay, and inadequate number of viral copies(<138 copies/mL). A negative result must be combined with clinical observations, patient history, and epidemiological information. The expected result is Negative.  Fact Sheet for Patients:  BloggerCourse.com  Fact Sheet for Healthcare Providers:  SeriousBroker.it  This test is no t yet approved or cleared by the [place] FDA and  has been authorized for detection and/or diagnosis of SARS-CoV-2 by FDA under an Emergency Use Authorization (EUA). This EUA will remain  in effect (meaning this test can be used)  for the duration of the COVID-19 declaration under Section 564(b)(1) of the Act, 21 U.S.C.section 360bbb-3(b)(1), unless the authorization is terminated  or revoked sooner.       Influenza A by PCR NEGATIVE NEGATIVE Final   Influenza B by PCR NEGATIVE NEGATIVE Final    Comment: (NOTE) The Xpert Xpress SARS-CoV-2/FLU/RSV plus assay is intended as an aid in the diagnosis of influenza from Nasopharyngeal swab specimens and should not be used as a sole basis for treatment. Nasal washings and aspirates are unacceptable for  Xpert Xpress SARS-CoV-2/FLU/RSV testing.  Fact Sheet for Patients: BloggerCourse.com  Fact Sheet for Healthcare Providers: SeriousBroker.it  This test is not yet approved or cleared by the [place] FDA and has been authorized for detection and/or diagnosis of SARS-CoV-2 by FDA under an Emergency Use Authorization (EUA). This EUA will remain in effect (meaning this test can be used) for the duration of the COVID-19 declaration under Section 564(b)(1) of the Act, 21 U.S.C. section 360bbb-3(b)(1), unless the authorization is terminated or revoked.  Performed at [facility], [address], [place], [place] [address]      Labs: Basic Metabolic Panel: Recent Labs  Lab 08/24/20 1820 08/24/20 1828 08/25/20 0608 08/26/20 0410  NA 139 143  --  141  K 3.3* 3.2*  --  3.6  CL 103 104  --  103  CO2 25  --   --  28  GLUCOSE 225* 226*  --  92  BUN 15 15  --  18  CREATININE 0.87 0.90  --  0.95  CALCIUM 9.3  --   --  9.5  MG  --   --  1.9  --   PHOS  --   --  3.1  --    Liver Function Tests: Recent Labs  Lab 08/24/20 1820  AST 31  ALT 37  ALKPHOS 68  BILITOT 0.2*  PROT 7.8  ALBUMIN 4.2   No results for input(s): LIPASE, AMYLASE in the last 168 hours. No results for input(s): AMMONIA in the last 168 hours. CBC: Recent Labs  Lab 08/24/20 1820 08/24/20 1828 08/26/20 0410  WBC  8.5  --  8.0  NEUTROABS 6.4  --   --   HGB 14.1 16.0 13.3  HCT 45.0 47.0 42.5  MCV 74.9*  --  74.0*  PLT 186  --  173   Cardiac Enzymes: No results for input(s): CKTOTAL, CKMB, CKMBINDEX, TROPONINI in the last 168 hours. BNP: BNP (last 3 results) No results for input(s): BNP in the last 8760 hours.  ProBNP (last 3 results) No results for input(s): PROBNP in the last 8760 hours.  CBG: Recent Labs  Lab 08/24/20 1809 08/25/20 2108 08/26/20 0619 08/26/20 1119  GLUCAP 222* 190* 114* 138*       Signed:  Briant Cedar, MD [facility] 08/26/2020, 4:02 PM

## 2020-08-26 NOTE — Evaluation (Signed)
Occupational Therapy Evaluation Patient Details Name: Paul [name] MRN: [id number] DOB: 12/30/1960 Today's Date: 08/26/2020    History of Present Illness 59 y.o. male with medical history significant of hypertension, BPH, tobacco use, alcohol use presenting to the ED with complaints of dizziness and left-sided weakness. 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   Clinical Impression   Typically Pt is independent in ADL and transfers. 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 Today Pt presents with L sided weakness (grossly 4+/5 in comparison with R) however he is at baseline from a functional standpoint for ADL. He was able to dress his lower body, perform transfers without assist, perform multi-step grooming tasks while standing at sink (he did lean against sink for balance). He was able to perform fine motor tasks without assist. Pt is not concerned about being able to perform ADL in home setting. OT educated Pt on signs/symptoms of CVA (BE FAST). OT to sign off at this time as Pt at baseline for ADL and education complete - encourage Pt to continue to functionally use LUE.     Follow Up Recommendations  No OT follow up    Equipment Recommendations  None recommended by OT    Recommendations for Other Services       Precautions / Restrictions Precautions Precautions: Fall Restrictions Weight Bearing Restrictions: No      Mobility Bed Mobility Overal bed mobility: Independent             General bed mobility comments: pt performs supine to sitting as well as supine to long sitting multiple times this session    Transfers Overall transfer level: Independent Equipment used: None             General transfer comment: no physical assist needed    Balance Overall balance assessment: Needs assistance Sitting-balance support: No upper extremity supported;Feet supported Sitting balance-Leahy Scale: Good     Standing  balance support: No upper extremity supported;During functional activity Standing balance-Leahy Scale: Fair Standing balance comment: leans against sink for static standing during grooming, does not reach out for support during in room mobility                           ADL either performed or assessed with clinical judgement   ADL Overall ADL's : At baseline                                       General ADL Comments: able to don/doff socks from seated position, perform transfers, sink level grooming (multi-step and required opening containers)     Vision Baseline Vision/History: Wears glasses Wears Glasses: At all times Patient Visual Report: No change from baseline Vision Assessment?: No apparent visual deficits     Perception     Praxis      Pertinent Vitals/Pain Pain Assessment: 0-10 Pain Score: 2  Pain Location: headache Pain Descriptors / Indicators: Headache Pain Intervention(s): Monitored during session     Hand Dominance Right   Extremity/Trunk Assessment Upper Extremity Assessment Upper Extremity Assessment: LUE deficits/detail LUE Deficits / Details: grossly 4+/5, able to perform single digit lifts, fine motor coordination, supination/pronation, full ROM at elbow/shoulder LUE Coordination: WNL   Lower Extremity Assessment Lower Extremity Assessment: Defer to PT evaluation LLE Deficits / Details: grossly 4+/5   Cervical /  Trunk Assessment Cervical / Trunk Assessment: Normal   Communication Communication Communication: No difficulties   Cognition Arousal/Alertness: Awake/alert Behavior During Therapy: WFL for tasks assessed/performed Overall Cognitive Status: Within Functional Limits for tasks assessed                                     General Comments  VSS throughout session    Exercises     Shoulder Instructions      Home Living Family/patient expects to be discharged to:: Private residence Living  Arrangements: Other relatives;Children (cousin, daughter) Available Help at Discharge: Family;Available PRN/intermittently Type of Home: House Home Access: Stairs to enter [employer] of Steps: 3 Entrance Stairs-Rails: None Home Layout: Two level (pt reports typically sleeping in basement) Alternate Level Stairs-Number of Steps: flight   Bathroom Shower/Tub: Chief Strategy Officer: Standard     Home Equipment: None          Prior Functioning/Environment Level of Independence: Independent        Comments: pt works as a Location manager, involves a lot of walking        OT Problem List: Decreased strength      OT Treatment/Interventions:      OT Goals(Current goals can be found in the care plan section) Acute Rehab OT Goals Patient Stated Goal: to go home OT Goal Formulation: With patient  OT Frequency:     Barriers to D/C:            Co-evaluation              AM-PAC OT "6 Clicks" Daily Activity     Outcome Measure Help from another person eating meals?: None Help from another person taking care of personal grooming?: None Help from another person toileting, which includes using toliet, bedpan, or urinal?: None Help from another person bathing (including washing, rinsing, drying)?: None Help from another person to put on and taking off regular upper body clothing?: None Help from another person to put on and taking off regular lower body clothing?: None 6 Click Score: 24   End of Session Equipment Utilized During Treatment: Gait belt Nurse Communication: Mobility status  Activity Tolerance: Patient tolerated treatment well Patient left: in bed;with call bell/phone within reach;with bed alarm set  OT Visit Diagnosis: Hemiplegia and hemiparesis Hemiplegia - Right/Left: Left Hemiplegia - dominant/non-dominant: Non-Dominant Hemiplegia - caused by: Cerebral infarction                Time: 1914-7829 OT Time Calculation (min): 18  min Charges:  OT General Charges $OT Visit: 1 Visit OT Evaluation $OT Eval Low Complexity: 1 Low  Nyoka Cowden OTR/L Acute Rehabilitation Services Pager: [phone] Office: [phone]  Evern Bio Terina Mcelhinny 08/26/2020, 3:11 PM

## 2020-09-03 ENCOUNTER — Encounter (HOSPITAL_COMMUNITY): Payer: Self-pay

## 2020-09-03 ENCOUNTER — Other Ambulatory Visit: Payer: Self-pay

## 2020-09-03 ENCOUNTER — Ambulatory Visit (HOSPITAL_COMMUNITY)
Admission: EM | Admit: 2020-09-03 | Discharge: 2020-09-03 | Disposition: A | Discharge status: Home / Self Care | Payer: PRIVATE HEALTH INSURANCE

## 2020-09-03 DIAGNOSIS — S01309A Unspecified open wound of unspecified ear, initial encounter: Secondary | ICD-10-CM | POA: Diagnosis not present

## 2020-09-03 HISTORY — DX: Cerebral infarction, unspecified: I63.9

## 2020-09-03 MED ORDER — SILVER NITRATE-POT NITRATE 75-25 % EX MISC
CUTANEOUS | Status: AC
  Filled 2020-09-03: qty 10

## 2020-09-03 NOTE — ED Triage Notes (Addendum)
Pt in with c/o bleeding from right ear that he noticed this morning when he woke up. Also c/o otalgia in right ear States he had a stroke recently   Denies any recent trauma to ear, no headache, no vision changes,

## 2020-09-03 NOTE — ED Provider Notes (Signed)
[facility]    CSN: [id number] Arrival date & time: 09/03/20  1358      History   Chief Complaint Chief Complaint  Patient presents with  . bleeding from right ear    HPI Paul [name] is a 59 y.o. male.   HPI   59 year old male here for evaluation of bleeding and pain in his right ear.  Patient reports that he noticed this when he woke up this morning.  He denies fever, changes to hearing, runny nose, sore throat, or cough.  Patient denies use of Q-tips or putting any foreign bodies in his ear.  Patient also has not had any recent travel involving flying or high-altitude.  Past Medical History:  Diagnosis Date  . Hypertension   . Stroke (HCC)   . Urinary frequency     Patient Active Problem List   Diagnosis Date Noted  . Acute CVA (cerebrovascular accident) (HCC) 08/25/2020  . HTN (hypertension) 08/25/2020  . BPH (benign prostatic hyperplasia) 08/25/2020  . Tobacco use 08/25/2020  . Alcohol use 08/25/2020    Past Surgical History:  Procedure Laterality Date  . COLONOSCOPY WITH PROPOFOL N/A 01/31/2016   Procedure: COLONOSCOPY WITH PROPOFOL;  Surgeon: Charolett Bumpers, MD;  Location: [facility];  Service: Endoscopy;  Laterality: N/A;  . NO PAST SURGERIES         Home Medications    Prior to Admission medications   Medication Sig Start Date End Date Taking? Authorizing Provider  albuterol (VENTOLIN HFA) 108 (90 Base) MCG/ACT inhaler Inhale 1-2 puffs into the lungs every 4 (four) hours as needed for wheezing or shortness of breath. 08/26/20   Briant Cedar, MD  amLODipine (NORVASC) 5 MG tablet Take 1 tablet (5 mg total) by mouth daily. 08/26/20 09/25/20  Briant Cedar, MD  aspirin EC 81 MG tablet Take 1 tablet (81 mg total) by mouth daily. Swallow whole. 08/26/20 09/25/20  Briant Cedar, MD  atorvastatin (LIPITOR) 80 MG tablet Take 1 tablet (80 mg total) by mouth daily. 08/27/20 09/26/20  Briant Cedar, MD  clopidogrel (PLAVIX) 75  MG tablet Take 1 tablet (75 mg total) by mouth daily for 21 days. 08/27/20 09/17/20  Briant Cedar, MD  Cyanocobalamin (VITAMIN B-12 PO) Take 1 tablet by mouth daily.    [provider]  fluticasone (FLONASE) 50 MCG/ACT nasal spray Place 2 sprays into both nostrils daily. 08/26/20   Briant Cedar, MD  MAGNESIUM PO Take 1 tablet by mouth daily.    [provider]  Omega-3 Fatty Acids (OMEGA 3 PO) Take 1 capsule by mouth daily.    [provider]  Polyvinyl Alcohol-Povidone (REFRESH OP) Place 1 drop into both eyes daily as needed (For dry eyes.).    [provider]  Spacer/Aero-Holding Chambers (AEROCHAMBER PLUS) inhaler Use with inhaler 08/04/20   Domenick Gong, MD  VITAMIN D PO Take 1 tablet by mouth daily.    [provider]  amLODipine-benazepril (LOTREL) 5-20 MG capsule Take 1 capsule by mouth daily. 01/02/16 08/04/20  [provider]    Family History History reviewed. No pertinent family history.  Social History Social History   Tobacco Use  . Smoking status: Current Some Day Smoker    Packs/day: 0.50    Years: 30.00    Pack years: 15.00  . Smokeless tobacco: Never Used  Substance Use Topics  . Alcohol use: Yes    Comment: 1-2 beers most days  . Drug use: No  Allergies   Patient has no known allergies.   Review of Systems Review of Systems  Constitutional: Negative for activity change and fever.  HENT: Positive for ear discharge and ear pain. Negative for congestion, hearing loss, rhinorrhea, sinus pressure, sinus pain and sore throat.   Respiratory: Negative for cough.   Skin: Negative for rash.  Hematological: Negative.   Psychiatric/Behavioral: Negative.      Physical Exam Triage Vital Signs ED Triage Vitals  Enc Vitals Group     BP 09/03/20 1424 (!) 169/97     Pulse Rate 09/03/20 1424 (!) 57     Resp 09/03/20 1424 18     Temp 09/03/20 1427 98 F (36.7 C)     Temp Source 09/03/20  1424 Oral     SpO2 09/03/20 1424 97 %     Weight --      Height --      Head Circumference --      Peak Flow --      Pain Score 09/03/20 1422 3     Pain Loc --      Pain Edu? --      Excl. in GC? --    No data found.  Updated Vital Signs BP (!) 169/97 (BP Location: Right Arm)   Pulse (!) 57   Temp 98 F (36.7 C) (Oral)   Resp 18   SpO2 97%   Visual Acuity Right Eye Distance:   Left Eye Distance:   Bilateral Distance:    Right Eye Near:   Left Eye Near:    Bilateral Near:     Physical Exam Vitals and nursing note reviewed.  Constitutional:      General: He is not in acute distress.    Appearance: Normal appearance. He is not toxic-appearing.  HENT:     Head: Normocephalic and atraumatic.     Right Ear: Tympanic membrane and ear canal normal.     Left Ear: Tympanic membrane, ear canal and external ear normal.     Ears:     Comments: There is a small pinprick sized area of bleeding on the posterior aspect of the right tragus.  It is a venous ooze but it will not stop it with direct pressure. Cardiovascular:     Rate and Rhythm: Normal rate and regular rhythm.     Pulses: Normal pulses.     Heart sounds: Normal heart sounds. No murmur heard. No gallop.   Pulmonary:     Effort: Pulmonary effort is normal.     Breath sounds: Normal breath sounds. No wheezing, rhonchi or rales.  Musculoskeletal:        General: No swelling or tenderness. Normal range of motion.  Skin:    General: Skin is warm and dry.     Capillary Refill: Capillary refill takes less than 2 seconds.     Findings: No erythema or rash.  Neurological:     General: No focal deficit present.     Mental Status: He is alert and oriented to person, place, and time.  Psychiatric:        Mood and Affect: Mood normal.        Behavior: Behavior normal.        Thought Content: Thought content normal.        Judgment: Judgment normal.      UC Treatments / Results  Labs (all labs ordered are listed, but  only abnormal results are displayed) Labs Reviewed - No data to display  EKG   Radiology No results found.  Procedures Procedures (including critical care time)  Medications Ordered in UC Medications - No data to display  Initial Impression / Assessment and Plan / UC Course  I have reviewed the triage vital signs and the nursing notes.  Pertinent labs & imaging results that were available during my care of the patient were reviewed by me and considered in my medical decision making (see chart for details).   Patient is here for evaluation of bleeding from his right ear.  Patient states that started this morning and it will not stop.  Patient denies trauma, foreign body, Q-tip use, changes in hearing, or cold symptoms.  Physical exam reveals a small pinprick sized area of bleeding backside of the right tragus.  Attempted to control with direct pressure unsuccessfully.  Area cauterized with a single silver nitrate stick.  Bleeding has resolved.   Final Clinical Impressions(s) / UC Diagnoses   Final diagnoses:  Open wound of tragus     Discharge Instructions     Avoid using Q-tips or rubbing the external part of your right ear canal for the next 24 to 36 hours so as to not wait night the bleeding.  Return for reevaluation should your symptoms worsen.    ED Prescriptions    None     PDMP not reviewed this encounter.   Becky Augusta, NP 09/03/20 1457

## 2020-09-03 NOTE — Discharge Instructions (Addendum)
Avoid using Q-tips or rubbing the external part of your right ear canal for the next 24 to 36 hours so as to not wait night the bleeding.  Return for reevaluation should your symptoms worsen.

## 2020-09-14 ENCOUNTER — Telehealth: Payer: Self-pay | Admitting: Adult Health

## 2020-09-14 ENCOUNTER — Inpatient Hospital Stay (INDEPENDENT_AMBULATORY_CARE_PROVIDER_SITE_OTHER): Payer: PRIVATE HEALTH INSURANCE | Admitting: Primary Care

## 2020-09-14 NOTE — Telephone Encounter (Addendum)
error:[id number] ° °

## 2020-10-06 ENCOUNTER — Ambulatory Visit (INDEPENDENT_AMBULATORY_CARE_PROVIDER_SITE_OTHER): Payer: PRIVATE HEALTH INSURANCE | Admitting: Physician Assistant

## 2020-10-06 ENCOUNTER — Other Ambulatory Visit: Payer: Self-pay

## 2020-10-06 ENCOUNTER — Ambulatory Visit (INDEPENDENT_AMBULATORY_CARE_PROVIDER_SITE_OTHER): Payer: Self-pay

## 2020-10-06 VITALS — BP 156/75 | HR 78 | Temp 98.7°F

## 2020-10-06 DIAGNOSIS — M542 Cervicalgia: Secondary | ICD-10-CM | POA: Diagnosis not present

## 2020-10-06 DIAGNOSIS — H7292 Unspecified perforation of tympanic membrane, left ear: Secondary | ICD-10-CM | POA: Insufficient documentation

## 2020-10-06 DIAGNOSIS — Z8673 Personal history of transient ischemic attack (TIA), and cerebral infarction without residual deficits: Secondary | ICD-10-CM | POA: Diagnosis not present

## 2020-10-06 DIAGNOSIS — I10 Essential (primary) hypertension: Secondary | ICD-10-CM | POA: Diagnosis not present

## 2020-10-06 MED ORDER — CLOPIDOGREL BISULFATE 75 MG PO TABS: 75.0000 mg | ORAL_TABLET | Freq: Every day | ORAL | 0 refills | Status: DC

## 2020-10-06 MED ORDER — ACETAMINOPHEN 500 MG PO TABS: 500.0000 mg | ORAL_TABLET | Freq: Four times a day (QID) | ORAL | 0 refills | Status: DC | PRN

## 2020-10-06 MED ORDER — ATORVASTATIN CALCIUM 80 MG PO TABS: 80.0000 mg | ORAL_TABLET | Freq: Every day | ORAL | 0 refills | Status: DC

## 2020-10-06 MED ORDER — AMLODIPINE BESYLATE 5 MG PO TABS: 5.0000 mg | ORAL_TABLET | Freq: Every day | ORAL | 0 refills | Status: DC

## 2020-10-06 NOTE — Progress Notes (Signed)
New Patient Office Visit  Subjective:  Patient ID: Paul [name], male    DOB: Sep 23, 1960  Age: 60 y.o. MRN: [id number]  CC:  Chief Complaint  Patient presents with  . Medication Refill    HPI Paul [name] was hospitalized from December seventh through August 26, 2020.  Hospital Course:  Principal Problem:   Acute CVA (cerebrovascular accident) (HCC) Active Problems:   HTN (hypertension)   BPH (benign prostatic hyperplasia)   Tobacco use   Alcohol use   Acute CVA Brain MRI showing a 1 cm acute ischemic infarct involving the right paramedian pons. Suspected associated faint petechial hemorrhage without frank hemorrhagic transformation or significant regional mass-effect. Additional punctate acute to subacute ischemic infarct involving the posterior right parietal centrum semi ovale LDL 103, A1c 6.1 Echo showed EF of 55 to 60%, no regional wall motion abnormality, grade 1 diastolic dysfunction,interatrial septum was not well visualized Neurology consulted, recommend to discharge on aspirin and Plavix for 3 weeks, and then aspirin alone.  Recommend outpatient follow-up Continue statins,aspirin, Plavix PT/OT/SLP-outpatient PT  Hypertension Resume amlodipine 5 mg on discharge Follow-up with PCP  Tobacco use Advised to quit  Alcohol use disorder No signs of acute withdrawal at this time. S/P CIWA protocol Advised to quit  Mild hypokalemia Resolved  Abnormal EKG EKG showing T wave inversions in inferior leads and slight ST elevations in anterior leads. No prior tracing for comparison. Patient is not endorsing any anginal symptoms. Troponin negative Echo as above PCP follow-up  Prediabetes with hyperglycemia A1c 6.1 Advised patient on diet control, exercise Follow-up with PCP with close glucose monitoring  Reports today that he has been out of his medications for the past month, was not able to have a hospital follow-up in a timely basis due to  appointment having to be rescheduled.  Does not check blood pressure at home, states that he has been working on a heart healthy diet.  Complains of neck pain, states that it has been present for "many years".  Denies injury or trauma has not tried anything for relief    Past Medical History:  Diagnosis Date  . Hypertension   . Stroke (HCC)   . Urinary frequency     Past Surgical History:  Procedure Laterality Date  . COLONOSCOPY WITH PROPOFOL N/A 01/31/2016   Procedure: COLONOSCOPY WITH PROPOFOL;  Surgeon: Charolett Bumpers, MD;  Location: [facility];  Service: Endoscopy;  Laterality: N/A;  . NO PAST SURGERIES      History reviewed. No pertinent family history.  Social History   Socioeconomic History  . Marital status: Single    Spouse name: Not on file  . Number of children: Not on file  . Years of education: Not on file  . Highest education level: Not on file  Occupational History  . Not on file  Tobacco Use  . Smoking status: Current Some Day Smoker    Packs/day: 0.50    Years: 30.00    Pack years: 15.00  . Smokeless tobacco: Never Used  Substance and Sexual Activity  . Alcohol use: Yes    Comment: 1-2 beers most days  . Drug use: No  . Sexual activity: Not on file  Other Topics Concern  . Not on file  Social History Narrative  . Not on file   Social Determinants of Health   Financial Resource Strain: Not on file  Food Insecurity: Not on file  Transportation Needs: Not on file  Physical Activity: Not on file  Stress:  Not on file  Social Connections: Not on file  Intimate Partner Violence: Not on file    ROS Review of Systems  Constitutional: Negative.   HENT: Negative.   Eyes: Negative.   Respiratory: Negative.   Cardiovascular: Negative.   Gastrointestinal: Negative.   Endocrine: Negative.   Genitourinary: Negative.   Musculoskeletal: Positive for neck pain. Negative for neck stiffness.  Skin: Negative.   Allergic/Immunologic: Negative.    Neurological: Negative for dizziness, syncope, weakness and headaches.  Hematological: Negative.   Psychiatric/Behavioral: Negative.     Objective:   Today's Vitals: BP (!) 156/75 (BP Location: Left Arm, Patient Position: Sitting, Cuff Size: Normal)   Pulse 78   Temp 98.7 F (37.1 C) (Oral)   Physical Exam Vitals and nursing note reviewed.   BP (!) 156/75 (BP Location: Left Arm, Patient Position: Sitting, Cuff Size: Normal)   Pulse 78   Temp 98.7 F (37.1 C) (Oral)   General Appearance:    Alert, cooperative, no distress, appears stated age  Head:    Normocephalic, without obvious abnormality, atraumatic  Eyes:    PERRL, conjunctiva/corneas clear, EOM's intact, fundi    benign, both eyes       Ears:    Normal TM's and external ear canals, both ears  Nose:   Nares normal, septum midline, mucosa normal, no drainage   or sinus tenderness  Throat:   Lips, mucosa, and tongue normal; teeth and gums normal  Neck:   Supple, symmetrical, trachea midline, no adenopathy;       thyroid:  No enlargement/tenderness/nodules; no carotid   bruit or JVD  Back:     Symmetric, no curvature, ROM normal, no CVA tenderness  Lungs:     Clear to auscultation bilaterally, respirations unlabored  Chest wall:    No tenderness or deformity  Heart:    Regular rate and rhythm, S1 and S2 normal, no murmur, rub   or gallop           Extremities:   Extremities normal, atraumatic, no cyanosis or edema  Pulses:   2+ and symmetric all extremities  Skin:   Skin color, texture, turgor normal, no rashes or lesions  Lymph nodes:   Cervical, supraclavicular, and axillary nodes normal  Neurologic:   CNII-XII intact. Normal strength, sensation and reflexes      throughout     Assessment & Plan:   Problem List Items Addressed This Visit      Cardiovascular and Mediastinum   HTN (hypertension) - Primary   Relevant Medications   [employer] ASPIRIN LOW DOSE 81 MG EC tablet   amLODipine (NORVASC) 5 MG tablet    atorvastatin (LIPITOR) 80 MG tablet     Other   History of CVA in adulthood   Relevant Medications   atorvastatin (LIPITOR) 80 MG tablet   clopidogrel (PLAVIX) 75 MG tablet   Neck pain   Relevant Medications   acetaminophen (TYLENOL) 500 MG tablet      Outpatient Encounter Medications as of 10/06/2020  Medication Sig  . acetaminophen (TYLENOL) 500 MG tablet Take 1 tablet (500 mg total) by mouth every 6 (six) hours as needed.  Marland Kitchen albuterol (VENTOLIN HFA) 108 (90 Base) MCG/ACT inhaler Inhale 1-2 puffs into the lungs every 4 (four) hours as needed for wheezing or shortness of breath.  Marland Kitchen amLODipine (NORVASC) 5 MG tablet Take 1 tablet (5 mg total) by mouth daily.  Marland Kitchen atorvastatin (LIPITOR) 80 MG tablet Take 1 tablet (80 mg total) by mouth daily.  Marland Kitchen  clopidogrel (PLAVIX) 75 MG tablet Take 1 tablet (75 mg total) by mouth daily.  . CVS ASPIRIN LOW DOSE 81 MG EC tablet SMARTSIG:1 Tablet(s) By Mouth Daily  . Cyanocobalamin (VITAMIN B-12 PO) Take 1 tablet by mouth daily.  . fluticasone (FLONASE) 50 MCG/ACT nasal spray Place 2 sprays into both nostrils daily.  Marland Kitchen MAGNESIUM PO Take 1 tablet by mouth daily.  . Omega-3 Fatty Acids (OMEGA 3 PO) Take 1 capsule by mouth daily.  . Polyvinyl Alcohol-Povidone (REFRESH OP) Place 1 drop into both eyes daily as needed (For dry eyes.).  Marland Kitchen Spacer/Aero-Holding Chambers (AEROCHAMBER PLUS) inhaler Use with inhaler  . VITAMIN D PO Take 1 tablet by mouth daily.  . [DISCONTINUED] amLODipine (NORVASC) 5 MG tablet Take 1 tablet (5 mg total) by mouth daily.  . [DISCONTINUED] amLODipine-benazepril (LOTREL) 5-20 MG capsule Take 1 capsule by mouth daily.  . [DISCONTINUED] atorvastatin (LIPITOR) 80 MG tablet Take 1 tablet (80 mg total) by mouth daily.  . [DISCONTINUED] clopidogrel (PLAVIX) 75 MG tablet Take by mouth.   No facility-administered encounter medications on file as of 10/06/2020.   1. Primary hypertension Refill medication, patient encouraged to obtain blood  pressure cuff, check blood pressure on a daily basis and keep a written log.  Recheck of blood pressure during office visit was much reduced, only slightly hypertensive.  Patient encouraged to keep hospital follow-up appointment to establish care. - amLODipine (NORVASC) 5 MG tablet; Take 1 tablet (5 mg total) by mouth daily.  Dispense: 30 tablet; Refill: 0  2. History of CVA in adulthood Refill given, patient encouraged to call office if medications were expensive, patient is unaware of his prescription coverage, refused [facility] - atorvastatin (LIPITOR) 80 MG tablet; Take 1 tablet (80 mg total) by mouth daily.  Dispense: 30 tablet; Refill: 0 - clopidogrel (PLAVIX) 75 MG tablet; Take 1 tablet (75 mg total) by mouth daily.  Dispense: 30 tablet; Refill: 0  3. Neck pain Trial Tylenol, patient education given on gentle stretching, ice and heat, patient education given on avoidance of NSAIDs - acetaminophen (TYLENOL) 500 MG tablet; Take 1 tablet (500 mg total) by mouth every 6 (six) hours as needed.  Dispense: 30 tablet; Refill: 0   I have reviewed the patient's medical history (PMH, PSH, Social History, Family History, Medications, and allergies) , and have been updated if relevant. I spent 30 minutes reviewing chart and  face to face time with patient.     Follow-up: Return if symptoms worsen or fail to improve.   Kasandra Knudsen Mayers, PA-C

## 2020-10-06 NOTE — Progress Notes (Signed)
Patient complains of generalized body pain with increased pain in the neck. Patient has taken medication today and patient has eaten today.

## 2020-10-06 NOTE — Telephone Encounter (Signed)
Marylu Lund RN at [facility] called stating that patient has been out of medication plavix and lipitor. He had scheduled follow up for hosptial encounter which was move back a month. He is in the ENT office today with BP 177/106 and 164/100. Marylu Lund reports that the patient is dizzy. Per request of janet I contacted office for possible appointment today. Per Gwinda Passe NP patient needs ER. This message was relayed to [employer]. She was then transferred to office to speak with MGT per her request.  Reason for Disposition . Physician call to PCP  Answer Assessment - Initial Assessment Questions 1. REASON FOR CALL or QUESTION: "What is your reason for calling today?" or "How can I best help you?" or "What question do you have that I can help answer?"     Patient needs seen today out of medication and hypertensive in ENT office 2. CALLER: Document the source of call. (e.g., laboratory, patient).     ENT office janet RN  Protocols used: PCP CALL - NO TRIAGE-A-AH

## 2020-10-06 NOTE — Patient Instructions (Signed)
I have refilled your medications for you.  It is very important for you to keep your follow-up appointment on Monday for further review.  For your neck pain, I encourage you to try gentle stretching, heating pads, and Tylenol as needed.  Roney Jaffe, PA-C Physician Assistant [facility] [email or web address]    Cervical Strain and Sprain Rehab Ask your health care provider which exercises are safe for you. Do exercises exactly as told by your health care provider and adjust them as directed. It is normal to feel mild stretching, pulling, tightness, or discomfort as you do these exercises. Stop right away if you feel sudden pain or your pain gets worse. Do not begin these exercises until told by your health care provider. Stretching and range-of-motion exercises Cervical side bending 1. Using good posture, sit on a stable chair or stand up. 2. Without moving your shoulders, slowly tilt your left / right ear to your shoulder until you feel a stretch in the opposite side neck muscles. You should be looking straight ahead. 3. Hold for __________ seconds. 4. Repeat with the other side of your neck. Repeat __________ times. Complete this exercise __________ times a day.   Cervical rotation 1. Using good posture, sit on a stable chair or stand up. 2. Slowly turn your head to the side as if you are looking over your left / right shoulder. ? Keep your eyes level with the ground. ? Stop when you feel a stretch along the side and the back of your neck. 3. Hold for __________ seconds. 4. Repeat this by turning to your other side. Repeat __________ times. Complete this exercise __________ times a day.   Thoracic extension and pectoral stretch 1. Roll a towel or a small blanket so it is about 4 inches (10 cm) in diameter. 2. Lie down on your back on a firm surface. 3. Put the towel lengthwise, under your spine in the middle of your back. It  should not be under your shoulder blades. The towel should line up with your spine from your middle back to your lower back. 4. Put your hands behind your head and let your elbows fall out to your sides. 5. Hold for __________ seconds. Repeat __________ times. Complete this exercise __________ times a day. Strengthening exercises Isometric upper cervical flexion 1. Lie on your back with a thin pillow behind your head and a small rolled-up towel under your neck. 2. Gently tuck your chin toward your chest and nod your head down to look toward your feet. Do not lift your head off the pillow. 3. Hold for __________ seconds. 4. Release the tension slowly. Relax your neck muscles completely before you repeat this exercise. Repeat __________ times. Complete this exercise __________ times a day. Isometric cervical extension 1. Stand about 6 inches (15 cm) away from a wall, with your back facing the wall. 2. Place a soft object, about 6-8 inches (15-20 cm) in diameter, between the back of your head and the wall. A soft object could be a small pillow, a ball, or a folded towel. 3. Gently tilt your head back and press into the soft object. Keep your jaw and forehead relaxed. 4. Hold for __________ seconds. 5. Release the tension slowly. Relax your neck muscles completely before you repeat this exercise. Repeat __________ times. Complete this exercise __________ times a day.   Posture and body mechanics Body mechanics refers to the movements and positions of your body while you do your daily activities.  Posture is part of body mechanics. Good posture and healthy body mechanics can help to relieve stress in your body's tissues and joints. Good posture means that your spine is in its natural S-curve position (your spine is neutral), your shoulders are pulled back slightly, and your head is not tipped forward. The following are general guidelines for applying improved posture and body mechanics to your everyday  activities. Sitting 1. When sitting, keep your spine neutral and keep your feet flat on the floor. Use a footrest, if necessary, and keep your thighs parallel to the floor. Avoid rounding your shoulders, and avoid tilting your head forward. 2. When working at a desk or a computer, keep your desk at a height where your hands are slightly lower than your elbows. Slide your chair under your desk so you are close enough to maintain good posture. 3. When working at a computer, place your monitor at a height where you are looking straight ahead and you do not have to tilt your head forward or downward to look at the screen.   Standing  When standing, keep your spine neutral and keep your feet about hip-width apart. Keep a slight bend in your knees. Your ears, shoulders, and hips should line up.  When you do a task in which you stand in one place for a long time, place one foot up on a stable object that is 2-4 inches (5-10 cm) high, such as a footstool. This helps keep your spine neutral.   Resting When lying down and resting, avoid positions that are most painful for you. Try to support your neck in a neutral position. You can use a contour pillow or a small rolled-up towel. Your pillow should support your neck but not push on it. This information is not intended to replace advice given to you by your health care provider. Make sure you discuss any questions you have with your health care provider. Document Revised: 12/25/2018 Document Reviewed: 06/05/2018 Elsevier Patient Education  2021 [employer].

## 2020-10-07 ENCOUNTER — Encounter: Payer: Self-pay | Admitting: Physician Assistant

## 2020-10-07 DIAGNOSIS — M542 Cervicalgia: Secondary | ICD-10-CM | POA: Insufficient documentation

## 2020-10-07 DIAGNOSIS — Z8673 Personal history of transient ischemic attack (TIA), and cerebral infarction without residual deficits: Secondary | ICD-10-CM | POA: Insufficient documentation

## 2020-10-11 ENCOUNTER — Inpatient Hospital Stay (INDEPENDENT_AMBULATORY_CARE_PROVIDER_SITE_OTHER): Payer: PRIVATE HEALTH INSURANCE

## 2020-10-25 ENCOUNTER — Other Ambulatory Visit: Payer: Self-pay

## 2020-10-25 ENCOUNTER — Ambulatory Visit (INDEPENDENT_AMBULATORY_CARE_PROVIDER_SITE_OTHER): Payer: PRIVATE HEALTH INSURANCE | Admitting: Primary Care

## 2020-10-25 ENCOUNTER — Encounter (INDEPENDENT_AMBULATORY_CARE_PROVIDER_SITE_OTHER): Payer: Self-pay | Admitting: Primary Care

## 2020-10-25 VITALS — BP 173/76 | HR 64 | Temp 97.7°F | Ht 62.0 in | Wt 133.6 lb

## 2020-10-25 DIAGNOSIS — I639 Cerebral infarction, unspecified: Secondary | ICD-10-CM | POA: Diagnosis not present

## 2020-10-25 DIAGNOSIS — F5101 Primary insomnia: Secondary | ICD-10-CM

## 2020-10-25 DIAGNOSIS — F4323 Adjustment disorder with mixed anxiety and depressed mood: Secondary | ICD-10-CM

## 2020-10-25 DIAGNOSIS — I10 Essential (primary) hypertension: Secondary | ICD-10-CM

## 2020-10-25 DIAGNOSIS — Z7689 Persons encountering health services in other specified circumstances: Secondary | ICD-10-CM | POA: Diagnosis not present

## 2020-10-25 DIAGNOSIS — Z720 Tobacco use: Secondary | ICD-10-CM

## 2020-10-25 DIAGNOSIS — Z1211 Encounter for screening for malignant neoplasm of colon: Secondary | ICD-10-CM

## 2020-10-25 MED ORDER — AMLODIPINE BESYLATE 10 MG PO TABS: 10.0000 mg | ORAL_TABLET | Freq: Every day | ORAL | 3 refills | Status: DC

## 2020-10-25 MED ORDER — HYDROCHLOROTHIAZIDE 25 MG PO TABS: 25.0000 mg | ORAL_TABLET | Freq: Every day | ORAL | 3 refills | Status: DC

## 2020-10-25 NOTE — Progress Notes (Signed)
New Patient Office Visit  Subjective:  Patient ID: Paul [name], male    DOB: Dec 27, 1960  Age: 60 y.o. MRN: [id number]  CC: No chief complaint on file.   HPI [name] is a 60 year old male who  presents for establishment of care and management of HTN. Denies shortness of breath, headaches, chest pain or lower extremity edema  Past Medical History:  Diagnosis Date  . Hypertension   . Stroke (HCC)   . Urinary frequency     Past Surgical History:  Procedure Laterality Date  . COLONOSCOPY WITH PROPOFOL N/A 01/31/2016   Procedure: COLONOSCOPY WITH PROPOFOL;  Surgeon: Charolett Bumpers, MD;  Location: [facility];  Service: Endoscopy;  Laterality: N/A;  . NO PAST SURGERIES      No family history on file.  Social History   Socioeconomic History  . Marital status: Single    Spouse name: Not on file  . Number of children: Not on file  . Years of education: Not on file  . Highest education level: Not on file  Occupational History  . Not on file  Tobacco Use  . Smoking status: Former Smoker    Packs/day: 0.50    Years: 30.00    Pack years: 15.00    Quit date: 08/24/2020    Years since quitting: 0.1  . Smokeless tobacco: Never Used  Substance and Sexual Activity  . Alcohol use: Yes    Comment: 1-2 beers most days  . Drug use: No  . Sexual activity: Not on file  Other Topics Concern  . Not on file  Social History Narrative  . Not on file   Social Determinants of Health   Financial Resource Strain: Not on file  Food Insecurity: Not on file  Transportation Needs: Not on file  Physical Activity: Not on file  Stress: Not on file  Social Connections: Not on file  Intimate Partner Violence: Not on file    ROS Review of Systems  Musculoskeletal: Positive for neck pain.       Arthritis / muscle ache and pain   Neurological: Positive for dizziness, weakness and light-headedness.       Unable to associate with activity , food , position  Right arm weakness   Psychiatric/Behavioral: Positive for sleep disturbance.  All other systems reviewed and are negative.   Objective:   Today's Vitals: BP (!) 173/76 (BP Location: Right Arm, Patient Position: Sitting, Cuff Size: Normal)   Pulse 64   Temp 97.7 F (36.5 C) (Temporal)   Ht 5\' 2"  (1.575 m)   Wt 133 lb 9.6 oz (60.6 kg)   SpO2 99%   BMI 24.44 kg/m   Physical Exam Vitals reviewed.  Constitutional:      Appearance: Normal appearance.  HENT:     Head: Normocephalic.     Comments: Pinhole in left ear     Right Ear: Tympanic membrane and external ear normal.     Left Ear: Tympanic membrane and external ear normal.     Nose: Nose normal.  Eyes:     Extraocular Movements: Extraocular movements intact.     Pupils: Pupils are equal, round, and reactive to light.  Cardiovascular:     Rate and Rhythm: Normal rate and regular rhythm.  Pulmonary:     Effort: Pulmonary effort is normal.     Breath sounds: Normal breath sounds.  Abdominal:     General: Abdomen is flat. Bowel sounds are normal.     Palpations:  Abdomen is soft.  Musculoskeletal:        General: Normal range of motion.     Cervical back: Normal range of motion. Rigidity present.  Skin:    General: Skin is warm and dry.  Neurological:     Mental Status: He is alert and oriented to person, place, and time.  Psychiatric:        Mood and Affect: Mood normal.        Behavior: Behavior normal.        Thought Content: Thought content normal.        Judgment: Judgment normal.     Outpatient Encounter Medications as of 10/25/2020  Medication Sig  . acetaminophen (TYLENOL) 500 MG tablet Take 1 tablet (500 mg total) by mouth every 6 (six) hours as needed.  Marland Kitchen amLODipine (NORVASC) 10 MG tablet Take 1 tablet (10 mg total) by mouth daily.  Marland Kitchen atorvastatin (LIPITOR) 80 MG tablet Take 1 tablet (80 mg total) by mouth daily.  . clopidogrel (PLAVIX) 75 MG tablet Take 1 tablet (75 mg total) by mouth daily.  . CVS ASPIRIN LOW DOSE 81 MG EC  tablet SMARTSIG:1 Tablet(s) By Mouth Daily  . hydrochlorothiazide (HYDRODIURIL) 25 MG tablet Take 1 tablet (25 mg total) by mouth daily.  . [DISCONTINUED] amLODipine (NORVASC) 5 MG tablet Take 1 tablet (5 mg total) by mouth daily.  Marland Kitchen albuterol (VENTOLIN HFA) 108 (90 Base) MCG/ACT inhaler Inhale 1-2 puffs into the lungs every 4 (four) hours as needed for wheezing or shortness of breath.  . Cyanocobalamin (VITAMIN B-12 PO) Take 1 tablet by mouth daily. (Patient not taking: Reported on 10/25/2020)  . fluticasone (FLONASE) 50 MCG/ACT nasal spray Place 2 sprays into both nostrils daily. (Patient not taking: Reported on 10/25/2020)  . MAGNESIUM PO Take 1 tablet by mouth daily. (Patient not taking: Reported on 10/25/2020)  . Omega-3 Fatty Acids (OMEGA 3 PO) Take 1 capsule by mouth daily. (Patient not taking: Reported on 10/25/2020)  . Polyvinyl Alcohol-Povidone (REFRESH OP) Place 1 drop into both eyes daily as needed (For dry eyes.). (Patient not taking: Reported on 10/25/2020)  . Spacer/Aero-Holding Chambers (AEROCHAMBER PLUS) inhaler Use with inhaler (Patient not taking: Reported on 10/25/2020)  . VITAMIN D PO Take 1 tablet by mouth daily.  . [DISCONTINUED] amLODipine-benazepril (LOTREL) 5-20 MG capsule Take 1 capsule by mouth daily.   No facility-administered encounter medications on file as of 10/25/2020.  Diagnoses and all orders for this visit:  Colon cancer screening -     Ambulatory referral to Gastroenterology  Encounter to establish care Establish care with PCP  Primary hypertension Counseled on blood pressure goal of less than 130/80, low-sodium, DASH diet, medication compliance, 150 minutes of moderate intensity exercise per week. Discussed medication compliance, adverse effects. Uncontrolled medication adjusted amlodipine increased to 10mg  and added HCTZ 25mg  both daily  -     amLODipine (NORVASC) 10 MG tablet; Take 1 tablet (10 mg total) by mouth daily. -     hydrochlorothiazide (HYDRODIURIL) 25  MG tablet; Take 1 tablet (25 mg total) by mouth daily.  Acute CVA (cerebrovascular accident) (HCC) LDL < 70 on atorvastatin 80mg  at bed time  Refer to neurology   Tobacco use He is aware of increased risk for lung cancer ,  other respiratory diseases recommend cessation another stroke.  States he has stop smoking since August 24, 2020.   Adjustment disorder with mixed anxiety and depressed mood Flowsheet Row Office Visit from 10/25/2020 in [facility]  PHQ-9 Total Score 16    Follow up with CSW Follow-up: Return in about 4 weeks (around 11/22/2020) for Bp f/u .   Grayce Sessions, NP

## 2020-10-25 NOTE — Patient Instructions (Addendum)
Hypertension, Adult Hypertension is another name for high blood pressure. High blood pressure forces your heart to work harder to pump blood. This can cause problems over time. There are two numbers in a blood pressure reading. There is a top number (systolic) over a bottom number (diastolic). It is best to have a blood pressure that is below 120/80. Healthy choices can help lower your blood pressure, or you may need medicine to help lower it. What are the causes? The cause of this condition is not known. Some conditions may be related to high blood pressure. What increases the risk?  Smoking.  Having type 2 diabetes mellitus, high cholesterol, or both.  Not getting enough exercise or physical activity.  Being overweight.  Having too much fat, sugar, calories, or salt (sodium) in your diet.  Drinking too much alcohol.  Having long-term (chronic) kidney disease.  Having a family history of high blood pressure.  Age. Risk increases with age.  Race. You may be at higher risk if you are African American.  Gender. Men are at higher risk than women before age 45. After age 65, women are at higher risk than men.  Having obstructive sleep apnea.  Stress. What are the signs or symptoms?  High blood pressure may not cause symptoms. Very high blood pressure (hypertensive crisis) may cause: ? Headache. ? Feelings of worry or nervousness (anxiety). ? Shortness of breath. ? Nosebleed. ? A feeling of being sick to your stomach (nausea). ? Throwing up (vomiting). ? Changes in how you see. ? Very bad chest pain. ? Seizures. How is this treated?  This condition is treated by making healthy lifestyle changes, such as: ? Eating healthy foods. ? Exercising more. ? Drinking less alcohol.  Your health care provider may prescribe medicine if lifestyle changes are not enough to get your blood pressure under control, and if: ? Your top number is above 130. ? Your bottom number is above  80.  Your personal target blood pressure may vary. Follow these instructions at home: Eating and drinking  If told, follow the DASH eating plan. To follow this plan: ? Fill one half of your plate at each meal with fruits and vegetables. ? Fill one fourth of your plate at each meal with whole grains. Whole grains include whole-wheat pasta, brown rice, and whole-grain bread. ? Eat or drink low-fat dairy products, such as skim milk or low-fat yogurt. ? Fill one fourth of your plate at each meal with low-fat (lean) proteins. Low-fat proteins include fish, chicken without skin, eggs, beans, and tofu. ? Avoid fatty meat, cured and processed meat, or chicken with skin. ? Avoid pre-made or processed food.  Eat less than 1,500 mg of salt each day.  Do not drink alcohol if: ? Your doctor tells you not to drink. ? You are pregnant, may be pregnant, or are planning to become pregnant.  If you drink alcohol: ? Limit how much you use to:  0-1 drink a day for women.  0-2 drinks a day for men. ? Be aware of how much alcohol is in your drink. In the [place] one drink equals one 12 oz bottle of beer (355 mL), one 5 oz glass of wine (148 mL), or one 1 oz glass of hard liquor (44 mL).   Lifestyle  Work with your doctor to stay at a healthy weight or to lose weight. Ask your doctor what the best weight is for you.  Get at least 30 minutes of exercise most   days of the week. This may include walking, swimming, or biking.  Get at least 30 minutes of exercise that strengthens your muscles (resistance exercise) at least 3 days a week. This may include lifting weights or doing Pilates.  Do not use any products that contain nicotine or tobacco, such as cigarettes, e-cigarettes, and chewing tobacco. If you need help quitting, ask your doctor.  Check your blood pressure at home as told by your doctor.  Keep all follow-up visits as told by your doctor. This is important.   Medicines  Take over-the-counter  and prescription medicines only as told by your doctor. Follow directions carefully.  Do not skip doses of blood pressure medicine. The medicine does not work as well if you skip doses. Skipping doses also puts you at risk for problems.  Ask your doctor about side effects or reactions to medicines that you should watch for. Contact a doctor if you:  Think you are having a reaction to the medicine you are taking.  Have headaches that keep coming back (recurring).  Feel dizzy.  Have swelling in your ankles.  Have trouble with your vision. Get help right away if you:  Get a very bad headache.  Start to feel mixed up (confused).  Feel weak or numb.  Feel faint.  Have very bad pain in your: ? Chest. ? Belly (abdomen).  Throw up more than once.  Have trouble breathing. Summary  Hypertension is another name for high blood pressure.  High blood pressure forces your heart to work harder to pump blood.  For most people, a normal blood pressure is less than 120/80.  Making healthy choices can help lower blood pressure. If your blood pressure does not get lower with healthy choices, you may need to take medicine. This information is not intended to replace advice given to you by your health care provider. Make sure you discuss any questions you have with your health care provider. Document Revised: 05/15/2018 Document Reviewed: 05/15/2018 [employer]vier Patient Education  2021 [employer]  Can try melatonin 5mg -15 mg at night for sleep, can also do benadryl 25-50mg  at night for sleep.  If this does not help we can try prescription medication.  Also here is some information about good sleep hygiene.   Insomnia Insomnia is frequent trouble falling and/or staying asleep. Insomnia can be a long term problem or a short term problem. Both are common. Insomnia can be a short term problem when the wakefulness is related to a certain stress or worry. Long term insomnia is often related to  ongoing stress during waking hours and/or poor sleeping habits. Overtime, sleep deprivation itself can make the problem worse. Every little thing feels more severe because you are overtired and your ability to cope is decreased. CAUSES   Stress, anxiety, and depression.  Poor sleeping habits.  Distractions such as TV in the bedroom.  Naps close to bedtime.  Engaging in emotionally charged conversations before bed.  Technical reading before sleep.  Alcohol and other sedatives. They may make the problem worse. They can hurt normal sleep patterns and normal dream activity.  Stimulants such as caffeine for several hours prior to bedtime.  Pain syndromes and shortness of breath can cause insomnia.  Exercise late at night.  Changing time zones may cause sleeping problems (jet lag). It is sometimes helpful to have someone observe your sleeping patterns. They should look for periods of not breathing during the night (sleep apnea). They should also look to see how long  those periods last. If you live alone or observers are uncertain, you can also be observed at a sleep clinic where your sleep patterns will be professionally monitored. Sleep apnea requires a checkup and treatment. Give your caregivers your medical history. Give your caregivers observations your family has made about your sleep.  SYMPTOMS   Not feeling rested in the morning.  Anxiety and restlessness at bedtime.  Difficulty falling and staying asleep. TREATMENT   Your caregiver may prescribe treatment for an underlying medical disorders. Your caregiver can give advice or help if you are using alcohol or other drugs for self-medication. Treatment of underlying problems will usually eliminate insomnia problems.  Medications can be prescribed for short time use. They are generally not recommended for lengthy use.  Over-the-counter sleep medicines are not recommended for lengthy use. They can be habit forming.  You can  promote easier sleeping by making lifestyle changes such as:  Using relaxation techniques that help with breathing and reduce muscle tension.  Exercising earlier in the day.  Changing your diet and the time of your last meal. No night time snacks.  Establish a regular time to go to bed.  Counseling can help with stressful problems and worry.  Soothing music and white noise may be helpful if there are background noises you cannot remove.  Stop tedious detailed work at least one hour before bedtime. HOME CARE INSTRUCTIONS   Keep a diary. Inform your caregiver about your progress. This includes any medication side effects. See your caregiver regularly. Take note of:  Times when you are asleep.  Times when you are awake during the night.  The quality of your sleep.  How you feel the next day. This information will help your caregiver care for you.  Get out of bed if you are still awake after 15 minutes. Read or do some quiet activity. Keep the lights down. Wait until you feel sleepy and go back to bed.  Keep regular sleeping and waking hours. Avoid naps.  Exercise regularly.  Avoid distractions at bedtime. Distractions include watching television or engaging in any intense or detailed activity like attempting to balance the household checkbook.  Develop a bedtime ritual. Keep a familiar routine of bathing, brushing your teeth, climbing into bed at the same time each night, listening to soothing music. Routines increase the success of falling to sleep faster.  Use relaxation techniques. This can be using breathing and muscle tension release routines. It can also include visualizing peaceful scenes. You can also help control troubling or intruding thoughts by keeping your mind occupied with boring or repetitive thoughts like the old concept of counting sheep. You can make it more creative like imagining planting one beautiful flower after another in your backyard garden.  During  your day, work to eliminate stress. When this is not possible use some of the previous suggestions to help reduce the anxiety that accompanies stressful situations. MAKE SURE YOU:   Understand these instructions.  Will watch your condition.  Will get help right away if you are not doing well or get worse. Document Released: 09/01/2000 Document Revised: 11/27/2011 Document Reviewed: 10/02/2007 [facility] Patient Information 2015 [place], [place]. This information is not intended to replace advice given to you by your health care provider. Make sure you discuss any questions you have with your health care provider.

## 2020-10-26 ENCOUNTER — Other Ambulatory Visit: Payer: Self-pay | Admitting: Physician Assistant

## 2020-10-26 DIAGNOSIS — Z8673 Personal history of transient ischemic attack (TIA), and cerebral infarction without residual deficits: Secondary | ICD-10-CM

## 2020-10-26 DIAGNOSIS — I10 Essential (primary) hypertension: Secondary | ICD-10-CM

## 2020-10-28 ENCOUNTER — Other Ambulatory Visit: Payer: Self-pay | Admitting: Physician Assistant

## 2020-10-28 DIAGNOSIS — Z8673 Personal history of transient ischemic attack (TIA), and cerebral infarction without residual deficits: Secondary | ICD-10-CM

## 2020-11-09 ENCOUNTER — Other Ambulatory Visit: Payer: Self-pay

## 2020-11-09 ENCOUNTER — Ambulatory Visit (INDEPENDENT_AMBULATORY_CARE_PROVIDER_SITE_OTHER): Payer: PRIVATE HEALTH INSURANCE | Admitting: Licensed Clinical Social Worker

## 2020-11-09 DIAGNOSIS — F4323 Adjustment disorder with mixed anxiety and depressed mood: Secondary | ICD-10-CM

## 2020-11-15 NOTE — BH Specialist Note (Signed)
Integrated Behavioral Health Initial In-Person Visit  MRN: [id number] Name: Paul [name]  Number of Integrated Behavioral Health Clinician visits:: 1/6 Session Start time: 11:05 AM  Session End time: 11:35 AM Total time: 30 minutes  Types of Service: Individual psychotherapy  Interpretor:No. Interpretor Name and Language: NA  Subjective: Paul [name] is a 60 y.o. male  Patient was referred by NP Edwards for depression and anxiety. Patient reports the following symptoms/concerns: Pt reports difficulty managing symptoms of depression and anxiety triggered by recent stroke and chronic pain that occurs daily. Pt reports "my body feels different' Pt endorses difficulty obtaining quality sleep and low energy Duration of problem: November 13, 21; Severity of problem: moderate  Objective: Mood: Anxious and Affect: Appropriate Risk of harm to self or others: No plan to harm self or others  Life Context: Family and Social: Pt receives support from family and friends. He resides with cousin School/Work: Pt is employed. He works twelve hour shifts Self-Care: Pt is compliant with medications prescribed to manage health conditions. States he has been "feeling a little better" Pt has stopped smoking for approx 2 months Life Changes: Pt has difficulty managing mental and physical health conditions  Patient and/or Family's Strengths/Protective Factors: Social connections, Social and Emotional competence, Concrete supports in place (healthy food, safe environments, etc.) and Sense of purpose  Goals Addressed: Patient will: 1. Increase knowledge and/or ability of: coping skills and healthy habits Pt agreed to continue taking vitamins and utilize healthy coping skills (mindfulness strategies, drinking tea, etc) to cope with stress. Sleep hygiene strategies discussed 2. Demonstrate ability to: Increase healthy adjustment to current life circumstances and Increase adequate support systems for  patient/family Pt agreed to speak with PCP about referral to Neurologist to further assess  Progress towards Goals: Ongoing  Interventions: Interventions utilized: Solution-Focused Strategies and Sleep Hygiene  Standardized Assessments completed: GAD-7 and PHQ 2&9  Patient Response: Pt was engaged during session and was successful in identifying strategies to assist with management of symptoms. He is not interested in med management to assist with mental health conditions.   Patient Centered Plan: Patient is on the following Treatment Plan(s):  Anxiety and Depression  Assessment: Patient currently experiencing difficulty managing depression and anxiety symptoms triggered by recent stroke.   Patient may benefit from therapy and medication management. Pt is interested in referral to physical therapy and neurology. PCP and referral coordinator informed.   Plan: 1. Follow up with behavioral health clinician on : Contact LCSW with any additional behavioral health and/or resource needs 2. Behavioral recommendations: Utilize strategies discussed 3. Referral(s): Integrated Behavioral Health Services (In Clinic) 4. "From scale of 1-10, how likely are you to follow plan?":   Bridgett Larsson, LCSW 11/15/20 11:47 AM

## 2020-11-22 ENCOUNTER — Ambulatory Visit (INDEPENDENT_AMBULATORY_CARE_PROVIDER_SITE_OTHER): Payer: PRIVATE HEALTH INSURANCE | Admitting: Primary Care

## 2020-11-22 ENCOUNTER — Encounter (INDEPENDENT_AMBULATORY_CARE_PROVIDER_SITE_OTHER): Payer: Self-pay | Admitting: Primary Care

## 2020-11-22 ENCOUNTER — Other Ambulatory Visit: Payer: Self-pay

## 2020-11-22 VITALS — BP 161/89 | HR 60 | Temp 97.9°F | Wt 133.8 lb

## 2020-11-22 DIAGNOSIS — R7303 Prediabetes: Secondary | ICD-10-CM | POA: Diagnosis not present

## 2020-11-22 DIAGNOSIS — M546 Pain in thoracic spine: Secondary | ICD-10-CM

## 2020-11-22 DIAGNOSIS — I10 Essential (primary) hypertension: Secondary | ICD-10-CM

## 2020-11-22 DIAGNOSIS — G8929 Other chronic pain: Secondary | ICD-10-CM | POA: Diagnosis not present

## 2020-11-22 MED ORDER — CELECOXIB 100 MG PO CAPS: 100.0000 mg | ORAL_CAPSULE | Freq: Two times a day (BID) | ORAL | 1 refills | Status: DC

## 2020-11-22 MED ORDER — METOPROLOL SUCCINATE ER 25 MG PO TB24: 25.0000 mg | ORAL_TABLET | Freq: Every day | ORAL | 3 refills | Status: DC

## 2020-11-22 NOTE — Patient Instructions (Addendum)
Diabetes Care, 44(Suppl 1), D40-C14. https://doi.org/https://doi.org/10.2337/dc21-S003">  Prediabetes Prediabetes is when your blood sugar (blood glucose) level is higher than normal but not high enough for you to be diagnosed with type 2 diabetes. Having prediabetes puts you at risk for developing type 2 diabetes (type 2 diabetes mellitus). With certain lifestyle changes, you may be able to prevent or delay the onset of type 2 diabetes. This is important because type 2 diabetes can lead to serious complications, such as:  Heart disease.  Stroke.  Blindness.  Kidney disease.  Depression.  Poor circulation in the feet and legs. In severe cases, this could lead to surgical removal of a leg (amputation). What are the causes? The exact cause of prediabetes is not known. It may result from insulin resistance. Insulin resistance develops when cells in the body do not respond properly to insulin that the body makes. This can cause excess glucose to build up in the blood. High blood glucose (hyperglycemia) can develop. What increases the risk? The following factors may make you more likely to develop this condition:  You have a family member with type 2 diabetes.  You are older than 45 years.  You had a temporary form of diabetes during a pregnancy (gestational diabetes).  You had polycystic ovary syndrome (PCOS).  You are overweight or obese.  You are inactive (sedentary).  You have a history of heart disease, including problems with cholesterol levels, high levels of blood fats, or high blood pressure. What are the signs or symptoms? You may have no symptoms. If you do have symptoms, they may include:  Increased hunger.  Increased thirst.  Increased urination.  Vision changes, such as blurry vision.  Tiredness (fatigue). How is this diagnosed? This condition can be diagnosed with blood tests. Your blood glucose may be checked with one or more of the following tests:  A  fasting blood glucose (FBG) test. You will not be allowed to eat (you will fast) for at least 8 hours before a blood sample is taken.  An A1C blood test (hemoglobin A1C). This test provides information about blood glucose levels over the previous 2?3 months.  An oral glucose tolerance test (OGTT). This test measures your blood glucose at two points in time: ? After fasting. This is your baseline level. ? Two hours after you drink a beverage that contains glucose. You may be diagnosed with prediabetes if:  Your FBG is 100?125 mg/dL (5.6-6.9 mmol/L).  Your A1C level is 5.7?6.4% (39-46 mmol/mol).  Your OGTT result is 140?199 mg/dL (7.8-11 mmol/L). These blood tests may be repeated to confirm your diagnosis.   How is this treated? Treatment may include dietary and lifestyle changes to help lower your blood glucose and prevent type 2 diabetes from developing. In some cases, medicine may be prescribed to help lower the risk of type 2 diabetes. Follow these instructions at home: Nutrition  Follow a healthy meal plan. This includes eating lean proteins, whole grains, legumes, fresh fruits and vegetables, low-fat dairy products, and healthy fats.  Follow instructions from your health care provider about eating or drinking restrictions.  Meet with a dietitian to create a healthy eating plan that is right for you.   Lifestyle  Do moderate-intensity exercise for at least 30 minutes a day on 5 or more days each week, or as told by your health care provider. A mix of activities may be best, such as: ? Brisk walking, swimming, biking, and weight lifting.  Lose weight as told by your health  care provider. Losing 5-7% of your body weight can reverse insulin resistance.  Do not drink alcohol if: ? Your health care provider tells you not to drink. ? You are pregnant, may be pregnant, or are planning to become pregnant.  If you drink alcohol: ? Limit how much you use to:  0-1 drink a day for  women.  0-2 drinks a day for men. ? Be aware of how much alcohol is in your drink. In the [place] one drink equals one 12 oz bottle of beer (355 mL), one 5 oz glass of wine (148 mL), or one 1 oz glass of hard liquor (44 mL). General instructions  Take over-the-counter and prescription medicines only as told by your health care provider. You may be prescribed medicines that help lower the risk of type 2 diabetes.  Do not use any products that contain nicotine or tobacco, such as cigarettes, e-cigarettes, and chewing tobacco. If you need help quitting, ask your health care provider.  Keep all follow-up visits. This is important. Where to find more information  American Diabetes Association: www.diabetes.org  Academy of Nutrition and Dietetics: www.eatright.org  American Heart Association: www.heart.org Contact a health care provider if:  You have any of these symptoms: ? Increased hunger. ? Increased urination. ? Increased thirst. ? Fatigue. ? Vision changes, such as blurry vision. Get help right away if you:  Have shortness of breath.  Feel confused.  Vomit or feel like you may vomit. Summary  Prediabetes is when your blood sugar (blood glucose)level is higher than normal but not high enough for you to be diagnosed with type 2 diabetes.  Having prediabetes puts you at risk for developing type 2 diabetes (type 2 diabetes mellitus).  Make lifestyle changes such as eating a healthy diet and exercising regularly to help prevent diabetes. Lose weight as told by your health care provider. This information is not intended to replace advice given to you by your health care provider. Make sure you discuss any questions you have with your health care provider. Document Revised: 12/04/2019 Document Reviewed: 12/04/2019 Elsevier Patient Education  2021 [employer]. Managing Your Hypertension Hypertension, also called high blood pressure, is when the force of the blood pressing against  the walls of the arteries is too strong. Arteries are blood vessels that carry blood from your heart throughout your body. Hypertension forces the heart to work harder to pump blood and may cause the arteries to become narrow or stiff. Understanding blood pressure readings Your personal target blood pressure may vary depending on your medical conditions, your age, and other factors. A blood pressure reading includes a higher number over a lower number. Ideally, your blood pressure should be below 120/80. You should know that:  The first, or top, number is called the systolic pressure. It is a measure of the pressure in your arteries as your heart beats.  The second, or bottom number, is called the diastolic pressure. It is a measure of the pressure in your arteries as the heart relaxes. Blood pressure is classified into four stages. Based on your blood pressure reading, your health care provider may use the following stages to determine what type of treatment you need, if any. Systolic pressure and diastolic pressure are measured in a unit called mmHg. Normal  Systolic pressure: below 120.  Diastolic pressure: below 80. Elevated  Systolic pressure: 120-129.  Diastolic pressure: below 80. Hypertension stage 1  Systolic pressure: 130-139.  Diastolic pressure: 80-89. Hypertension stage 2  Systolic pressure:  140 or above.  Diastolic pressure: 90 or above. How can this condition affect me? Managing your hypertension is an important responsibility. Over time, hypertension can damage the arteries and decrease blood flow to important parts of the body, including the brain, heart, and kidneys. Having untreated or uncontrolled hypertension can lead to:  A heart attack.  A stroke.  A weakened blood vessel (aneurysm).  Heart failure.  Kidney damage.  Eye damage.  Metabolic syndrome.  Memory and concentration problems.  Vascular dementia. What actions can I take to manage this  condition? Hypertension can be managed by making lifestyle changes and possibly by taking medicines. Your health care provider will help you make a plan to bring your blood pressure within a normal range. Nutrition  Eat a diet that is high in fiber and potassium, and low in salt (sodium), added sugar, and fat. An example eating plan is called the Dietary Approaches to Stop Hypertension (DASH) diet. To eat this way: ? Eat plenty of fresh fruits and vegetables. Try to fill one-half of your plate at each meal with fruits and vegetables. ? Eat whole grains, such as whole-wheat pasta, brown rice, or whole-grain bread. Fill about one-fourth of your plate with whole grains. ? Eat low-fat dairy products. ? Avoid fatty cuts of meat, processed or cured meats, and poultry with skin. Fill about one-fourth of your plate with lean proteins such as fish, chicken without skin, beans, eggs, and tofu. ? Avoid pre-made and processed foods. These tend to be higher in sodium, added sugar, and fat.  Reduce your daily sodium intake. Most people with hypertension should eat less than 1,500 mg of sodium a day.   Lifestyle  Work with your health care provider to maintain a healthy body weight or to lose weight. Ask what an ideal weight is for you.  Get at least 30 minutes of exercise that causes your heart to beat faster (aerobic exercise) most days of the week. Activities may include walking, swimming, or biking.  Include exercise to strengthen your muscles (resistance exercise), such as weight lifting, as part of your weekly exercise routine. Try to do these types of exercises for 30 minutes at least 3 days a week.  Do not use any products that contain nicotine or tobacco, such as cigarettes, e-cigarettes, and chewing tobacco. If you need help quitting, ask your health care provider.  Control any long-term (chronic) conditions you have, such as high cholesterol or diabetes.  Identify your sources of stress and find  ways to manage stress. This may include meditation, deep breathing, or making time for fun activities.   Alcohol use  Do not drink alcohol if: ? Your health care provider tells you not to drink. ? You are pregnant, may be pregnant, or are planning to become pregnant.  If you drink alcohol: ? Limit how much you use to:  0-1 drink a day for women.  0-2 drinks a day for men. ? Be aware of how much alcohol is in your drink. In the [place] one drink equals one 12 oz bottle of beer (355 mL), one 5 oz glass of wine (148 mL), or one 1 oz glass of hard liquor (44 mL). Medicines Your health care provider may prescribe medicine if lifestyle changes are not enough to get your blood pressure under control and if:  Your systolic blood pressure is 130 or higher.  Your diastolic blood pressure is 80 or higher. Take medicines only as told by your health care provider. Follow  the directions carefully. Blood pressure medicines must be taken as told by your health care provider. The medicine does not work as well when you skip doses. Skipping doses also puts you at risk for problems. Monitoring Before you monitor your blood pressure:  Do not smoke, drink caffeinated beverages, or exercise within 30 minutes before taking a measurement.  Use the bathroom and empty your bladder (urinate).  Sit quietly for at least 5 minutes before taking measurements. Monitor your blood pressure at home as told by your health care provider. To do this:  Sit with your back straight and supported.  Place your feet flat on the floor. Do not cross your legs.  Support your arm on a flat surface, such as a table. Make sure your upper arm is at heart level.  Each time you measure, take two or three readings one minute apart and record the results. You may also need to have your blood pressure checked regularly by your health care provider.   General information  Talk with your health care provider about your diet, exercise  habits, and other lifestyle factors that may be contributing to hypertension.  Review all the medicines you take with your health care provider because there may be side effects or interactions.  Keep all visits as told by your health care provider. Your health care provider can help you create and adjust your plan for managing your high blood pressure. Where to find more information  National Heart, Lung, and Blood Institute: PopSteam.is  American Heart Association: www.heart.org Contact a health care provider if:  You think you are having a reaction to medicines you have taken.  You have repeated (recurrent) headaches.  You feel dizzy.  You have swelling in your ankles.  You have trouble with your vision. Get help right away if:  You develop a severe headache or confusion.  You have unusual weakness or numbness, or you feel faint.  You have severe pain in your chest or abdomen.  You vomit repeatedly.  You have trouble breathing. These symptoms may represent a serious problem that is an emergency. Do not wait to see if the symptoms will go away. Get medical help right away. Call your local emergency services (911 in the [place] Do not drive yourself to the hospital. Summary  Hypertension is when the force of blood pumping through your arteries is too strong. If this condition is not controlled, it may put you at risk for serious complications.  Your personal target blood pressure may vary depending on your medical conditions, your age, and other factors. For most people, a normal blood pressure is less than 120/80.  Hypertension is managed by lifestyle changes, medicines, or both.  Lifestyle changes to help manage hypertension include losing weight, eating a healthy, low-sodium diet, exercising more, stopping smoking, and limiting alcohol. This information is not intended to replace advice given to you by your health care provider. Make sure you discuss any questions  you have with your health care provider. Document Revised: 10/10/2019 Document Reviewed: 08/05/2019 Elsevier Patient Education  2021 [employer].

## 2020-11-22 NOTE — Progress Notes (Signed)
Established Patient Office Visit  Subjective:  Patient ID: Paul [name], male    DOB: 11/17/1960  Age: 60 y.o. MRN: [id number]  CC:  Chief Complaint  Patient presents with  . Hypertension    HPI [name] is a 60 year old male who presents for hypertension evaluation/BP check he takes his amlodipine 10 mg daily and HCTZ 25 mg daily.  Today's reading is elevated at home his readings range from 120-150 systolic and diastolic ranges from 80-90. Denies shortness of breath, headaches, chest pain or lower extremity edema, sudden onset, vision changes, unilateral weakness, dizziness, paresthesias.  He is concerned with pain in thoracic area T1-T5 this has been a problem that has become worst since his stroke. Past Medical History:  Diagnosis Date  . Hypertension   . Stroke (HCC)   . Urinary frequency     Past Surgical History:  Procedure Laterality Date  . COLONOSCOPY WITH PROPOFOL N/A 01/31/2016   Procedure: COLONOSCOPY WITH PROPOFOL;  Surgeon: Charolett Bumpers, MD;  Location: [facility];  Service: Endoscopy;  Laterality: N/A;  . NO PAST SURGERIES      No family history on file.  Social History   Socioeconomic History  . Marital status: Single    Spouse name: Not on file  . Number of children: Not on file  . Years of education: Not on file  . Highest education level: Not on file  Occupational History  . Not on file  Tobacco Use  . Smoking status: Former Smoker    Packs/day: 0.50    Years: 30.00    Pack years: 15.00    Quit date: 08/24/2020    Years since quitting: 0.2  . Smokeless tobacco: Never Used  Substance and Sexual Activity  . Alcohol use: Yes    Comment: 1-2 beers most days  . Drug use: No  . Sexual activity: Not on file  Other Topics Concern  . Not on file  Social History Narrative  . Not on file   Social Determinants of Health   Financial Resource Strain: Not on file  Food Insecurity: Not on file  Transportation Needs: Not on file  Physical  Activity: Not on file  Stress: Not on file  Social Connections: Not on file  Intimate Partner Violence: Not on file    Outpatient Medications Prior to Visit  Medication Sig Dispense Refill  . acetaminophen (TYLENOL) 500 MG tablet Take 1 tablet (500 mg total) by mouth every 6 (six) hours as needed. 30 tablet 0  . albuterol (VENTOLIN HFA) 108 (90 Base) MCG/ACT inhaler Inhale 1-2 puffs into the lungs every 4 (four) hours as needed for wheezing or shortness of breath. 1 each 0  . amLODipine (NORVASC) 10 MG tablet Take 1 tablet (10 mg total) by mouth daily. 90 tablet 3  . atorvastatin (LIPITOR) 80 MG tablet TAKE 1 TABLET BY MOUTH EVERY DAY 30 tablet 0  . clopidogrel (PLAVIX) 75 MG tablet TAKE 1 TABLET BY MOUTH EVERY DAY 30 tablet 0  . [facility] ASPIRIN LOW DOSE 81 MG EC tablet SMARTSIG:1 Tablet(s) By Mouth Daily    . fluticasone (FLONASE) 50 MCG/ACT nasal spray Place 2 sprays into both nostrils daily. 16 g 0  . hydrochlorothiazide (HYDRODIURIL) 25 MG tablet Take 1 tablet (25 mg total) by mouth daily. 90 tablet 3  . VITAMIN D PO Take 1 tablet by mouth daily.    . Cyanocobalamin (VITAMIN B-12 PO) Take 1 tablet by mouth daily. (Patient not taking: Reported on 10/25/2020)    .  MAGNESIUM PO Take 1 tablet by mouth daily. (Patient not taking: Reported on 10/25/2020)    . Omega-3 Fatty Acids (OMEGA 3 PO) Take 1 capsule by mouth daily. (Patient not taking: No sig reported)    . Polyvinyl Alcohol-Povidone (REFRESH OP) Place 1 drop into both eyes daily as needed (For dry eyes.). (Patient not taking: Reported on 10/25/2020)    . Spacer/Aero-Holding Chambers (AEROCHAMBER PLUS) inhaler Use with inhaler (Patient not taking: Reported on 10/25/2020) 1 each 2   No facility-administered medications prior to visit.    No Known Allergies  ROS Review of Systems   Pertinent positive and negative   Objective:    Physical Exam Vitals reviewed.  Constitutional:      Appearance: Normal appearance.  HENT:     Head:  Normocephalic.     Right Ear: Tympanic membrane and external ear normal.     Left Ear: Tympanic membrane and external ear normal.     Nose: Nose normal.  Eyes:     Extraocular Movements: Extraocular movements intact.     Pupils: Pupils are equal, round, and reactive to light.  Cardiovascular:     Rate and Rhythm: Normal rate and regular rhythm.  Pulmonary:     Effort: Pulmonary effort is normal.     Breath sounds: Normal breath sounds.  Abdominal:     General: Bowel sounds are normal. There is distension.     Palpations: Abdomen is soft.  Musculoskeletal:        General: Normal range of motion.     Cervical back: Normal range of motion and neck supple.  Skin:    General: Skin is warm and dry.  Neurological:     Mental Status: He is alert and oriented to person, place, and time.  Psychiatric:        Mood and Affect: Mood normal.        Behavior: Behavior normal.        Thought Content: Thought content normal.        Judgment: Judgment normal.     BP (!) 161/89 (BP Location: Right Arm, Patient Position: Sitting, Cuff Size: Normal)   Pulse 60   Temp 97.9 F (36.6 C) (Temporal)   Wt 133 lb 13.6 oz (60.7 kg)   SpO2 99%   BMI 24.48 kg/m  Wt Readings from Last 3 Encounters:  11/22/20 133 lb 13.6 oz (60.7 kg)  10/25/20 133 lb 9.6 oz (60.6 kg)  08/25/20 160 lb 15 oz (73 kg)     Health Maintenance Due  Topic Date Due  . COVID-19 Vaccine (1) Never done    There are no preventive care reminders to display for this patient.  No results found for: TSH Lab Results  Component Value Date   WBC 8.0 08/26/2020   HGB 13.3 08/26/2020   HCT 42.5 08/26/2020   MCV 74.0 (L) 08/26/2020   PLT 173 08/26/2020   Lab Results  Component Value Date   NA 141 08/26/2020   K 3.6 08/26/2020   CO2 28 08/26/2020   GLUCOSE 92 08/26/2020   BUN 18 08/26/2020   CREATININE 0.95 08/26/2020   BILITOT 0.2 (L) 08/24/2020   ALKPHOS 68 08/24/2020   AST 31 08/24/2020   ALT 37 08/24/2020   PROT  7.8 08/24/2020   ALBUMIN 4.2 08/24/2020   CALCIUM 9.5 08/26/2020   ANIONGAP 10 08/26/2020   Lab Results  Component Value Date   CHOL 169 08/26/2020   Lab Results  Component Value Date  HDL 54 08/26/2020   Lab Results  Component Value Date   LDLCALC 103 (H) 08/26/2020   Lab Results  Component Value Date   TRIG 58 08/26/2020   Lab Results  Component Value Date   CHOLHDL 3.1 08/26/2020   Lab Results  Component Value Date   HGBA1C 6.1 (H) 08/26/2020      Assessment & Plan:  Maximo was seen today for hypertension.  Diagnoses and all orders for this visit:  Essential hypertension Counseled on blood pressure goal of less than 130/80, low-sodium, DASH diet, medication compliance, 150 minutes of moderate intensity exercise per week. Discussed medication compliance, adverse effects.  Prediabetes New dx in hospital A1C 6.1. Found in hospital explained what prediabetes and to monitor carbs and more information provided on AVS  Chronic bilateral thoracic back pain -     celecoxib (CELEBREX) 100 MG capsule; Take 1 capsule (100 mg total) by mouth 2 (two) times daily. BACK PAIN  Location: upper thoracic Quality: burning, pressure, sharp, throbbing and uncomfortable Onset: gradual, waxing and waning Worse with: unknown stiff neck       Better with: nothing Radiation: no  Trauma: No  Best sitting/standing/leaning forward: yes (sleep no)  Red Flags Fecal/urinary incontinence: no  Numbness/Weakness: yes fingers Fever/chills/sweats: no  Night pain: yes  Unexplained weight loss: no  No relief with bedrest: no h/o cancer/immunosuppression: no  IV drug use: no  PMH of osteoporosis or chronic steroid use: no  Other orders -     metoprolol succinate (TOPROL-XL) 25 MG 24 hr tablet; Take 1 tablet (25 mg total) by mouth daily.   Meds ordered this encounter  Medications  . metoprolol succinate (TOPROL-XL) 25 MG 24 hr tablet    Sig: Take 1 tablet (25 mg total) by mouth daily.     Dispense:  90 tablet    Refill:  3  . celecoxib (CELEBREX) 100 MG capsule    Sig: Take 1 capsule (100 mg total) by mouth 2 (two) times daily.    Dispense:  180 capsule    Refill:  1    Follow-up: Return in about 6 weeks (around 01/03/2021) for BP and back pain in person .    Grayce Sessions, NP

## 2020-11-23 ENCOUNTER — Other Ambulatory Visit (INDEPENDENT_AMBULATORY_CARE_PROVIDER_SITE_OTHER): Payer: Self-pay | Admitting: Primary Care

## 2020-11-23 DIAGNOSIS — I639 Cerebral infarction, unspecified: Secondary | ICD-10-CM

## 2021-01-03 ENCOUNTER — Ambulatory Visit (INDEPENDENT_AMBULATORY_CARE_PROVIDER_SITE_OTHER): Payer: PRIVATE HEALTH INSURANCE | Admitting: Primary Care

## 2021-01-03 ENCOUNTER — Encounter (INDEPENDENT_AMBULATORY_CARE_PROVIDER_SITE_OTHER): Payer: Self-pay | Admitting: Primary Care

## 2021-01-03 ENCOUNTER — Other Ambulatory Visit: Payer: Self-pay

## 2021-01-03 VITALS — BP 168/104 | HR 62 | Temp 97.3°F | Ht 62.0 in | Wt 136.6 lb

## 2021-01-03 DIAGNOSIS — I10 Essential (primary) hypertension: Secondary | ICD-10-CM | POA: Diagnosis not present

## 2021-01-03 DIAGNOSIS — F5101 Primary insomnia: Secondary | ICD-10-CM

## 2021-01-03 NOTE — Progress Notes (Signed)
[facility]    [name] is a 60 year old male who presents for  hypertension evaluation, on previous visit medication was adjusted to include added metoprolol 25mg  XR and continue amlodipine 10mg  and HCTZ 25mg  daily . Denies shortness of breath, headaches, chest pain or lower extremity edema, sudden onset, vision changes, unilateral weakness, dizziness, paresthesias   Patient reports adherence with medications.  Current Medication List Current Outpatient Medications on File Prior to Visit  Medication Sig Dispense Refill  . acetaminophen (TYLENOL) 500 MG tablet Take 1 tablet (500 mg total) by mouth every 6 (six) hours as needed. 30 tablet 0  . albuterol (VENTOLIN HFA) 108 (90 Base) MCG/ACT inhaler Inhale 1-2 puffs into the lungs every 4 (four) hours as needed for wheezing or shortness of breath. 1 each 0  . amLODipine (NORVASC) 10 MG tablet Take 1 tablet (10 mg total) by mouth daily. 90 tablet 3  . atorvastatin (LIPITOR) 80 MG tablet TAKE 1 TABLET BY MOUTH EVERY DAY 30 tablet 0  . celecoxib (CELEBREX) 100 MG capsule Take 1 capsule (100 mg total) by mouth 2 (two) times daily. 180 capsule 1  . clopidogrel (PLAVIX) 75 MG tablet TAKE 1 TABLET BY MOUTH EVERY DAY 30 tablet 0  . [facility] ASPIRIN LOW DOSE 81 MG EC tablet SMARTSIG:1 Tablet(s) By Mouth Daily    . fluticasone (FLONASE) 50 MCG/ACT nasal spray Place 2 sprays into both nostrils daily. 16 g 0  . hydrochlorothiazide (HYDRODIURIL) 25 MG tablet Take 1 tablet (25 mg total) by mouth daily. 90 tablet 3  . metoprolol succinate (TOPROL-XL) 25 MG 24 hr tablet Take 1 tablet (25 mg total) by mouth daily. 90 tablet 3  . VITAMIN D PO Take 1 tablet by mouth daily.    . [DISCONTINUED] amLODipine-benazepril (LOTREL) 5-20 MG capsule Take 1 capsule by mouth daily.  6   No current facility-administered medications on file prior to visit.   Past Medical History  Past Medical History:  Diagnosis Date  . Hypertension   . Stroke (HCC)    . Urinary frequency    Dietary habits include: eating less salt Exercise habits include: none/walking  Family / Social history: unknown   ASCVD risk factors include-  O:  Physical Exam Vitals reviewed.  Constitutional:      Appearance: Normal appearance.  HENT:     Head: Normocephalic.     Right Ear: External ear normal.     Left Ear: External ear normal.     Nose: Nose normal.  Eyes:     Extraocular Movements: Extraocular movements intact.  Cardiovascular:     Rate and Rhythm: Normal rate and regular rhythm.  Pulmonary:     Effort: Pulmonary effort is normal.     Breath sounds: Normal breath sounds.  Abdominal:     General: Bowel sounds are normal.     Palpations: Abdomen is soft.  Musculoskeletal:        General: Normal range of motion.     Cervical back: Normal range of motion.  Skin:    General: Skin is warm and dry.  Neurological:     Mental Status: He is oriented to person, place, and time.  Psychiatric:        Mood and Affect: Mood normal.        Behavior: Behavior normal.        Thought Content: Thought content normal.        Judgment: Judgment normal.  Review of Systems  Musculoskeletal: Positive for neck pain.  Neurological: Positive for headaches.  All other systems reviewed and are negative.   Last 3 Office BP readings: BP Readings from Last 3 Encounters:  01/03/21 (!) 183/116  11/22/20 (!) 161/89  10/25/20 (!) 173/76    BMET    Component Value Date/Time   NA 141 08/26/2020 0410   K 3.6 08/26/2020 0410   CL 103 08/26/2020 0410   CO2 28 08/26/2020 0410   GLUCOSE 92 08/26/2020 0410   BUN 18 08/26/2020 0410   CREATININE 0.95 08/26/2020 0410   CALCIUM 9.5 08/26/2020 0410   GFRNONAA >60 08/26/2020 0410   GFRAA >60 03/09/2019 1553    Renal function: CrCl cannot be calculated (Patient's most recent lab result is older than the maximum 21 days allowed.).  Clinical ASCVD: No  The ASCVD Risk score Denman George DC Jr., et al., 2013)  failed to calculate for the following reasons:   The patient has a prior MI or stroke diagnosis   A/P: Primary hypertension Hypertension longstanding currently amlodipine 10 mg HCTZ 25 mg and metoprolol 25 mg XL daily on current medications. BP Goal = 130/80 mmHg. Patient is adherent with current medications.  -Continued with previous history of a stroke and uncontrolled hypertension risk are greater will refer to cardiology for better management and control -F/u labs ordered - -Counseled on lifestyle modifications for blood pressure control including reduced dietary sodium, increased exercise, adequate sleep  Primary insomnia Wakes up through the night having difficulty falling to sleep and staying . This can also be a contributing factor to elevated Bp. On AVS advise OTC medication. Explained in the event none of them work return for a prescription Grayce Sessions

## 2021-01-03 NOTE — Patient Instructions (Addendum)
Insomnia Can try melatonin 5mg -15 mg at night for sleep, can also do benadryl 25-50mg  at night for sleep.  If this does not help we can try prescription medication.  Also here is some information about good sleep hygiene.   Insomnia Insomnia is frequent trouble falling and/or staying asleep. Insomnia can be a long term problem or a short term problem. Both are common. Insomnia can be a short term problem when the wakefulness is related to a certain stress or worry. Long term insomnia is often related to ongoing stress during waking hours and/or poor sleeping habits. Overtime, sleep deprivation itself can make the problem worse. Every little thing feels more severe because you are overtired and your ability to cope is decreased. CAUSES   Stress, anxiety, and depression.  Poor sleeping habits.  Distractions such as TV in the bedroom.  Naps close to bedtime.  Engaging in emotionally charged conversations before bed.  Technical reading before sleep.  Alcohol and other sedatives. They may make the problem worse. They can hurt normal sleep patterns and normal dream activity.  Stimulants such as caffeine for several hours prior to bedtime.  Pain syndromes and shortness of breath can cause insomnia.  Exercise late at night.  Changing time zones may cause sleeping problems (jet lag). It is sometimes helpful to have someone observe your sleeping patterns. They should look for periods of not breathing during the night (sleep apnea). They should also look to see how long those periods last. If you live alone or observers are uncertain, you can also be observed at a sleep clinic where your sleep patterns will be professionally monitored. Sleep apnea requires a checkup and treatment. Give your caregivers your medical history. Give your caregivers observations your family has made about your sleep.  SYMPTOMS   Not feeling rested in the morning.  Anxiety and restlessness at bedtime.  Difficulty  falling and staying asleep. TREATMENT   Your caregiver may prescribe treatment for an underlying medical disorders. Your caregiver can give advice or help if you are using alcohol or other drugs for self-medication. Treatment of underlying problems will usually eliminate insomnia problems.  Medications can be prescribed for short time use. They are generally not recommended for lengthy use.  Over-the-counter sleep medicines are not recommended for lengthy use. They can be habit forming.  You can promote easier sleeping by making lifestyle changes such as:  Using relaxation techniques that help with breathing and reduce muscle tension.  Exercising earlier in the day.  Changing your diet and the time of your last meal. No night time snacks.  Establish a regular time to go to bed.  Counseling can help with stressful problems and worry.  Soothing music and white noise may be helpful if there are background noises you cannot remove.  Stop tedious detailed work at least one hour before bedtime. HOME CARE INSTRUCTIONS   Keep a diary. Inform your caregiver about your progress. This includes any medication side effects. See your caregiver regularly. Take note of:  Times when you are asleep.  Times when you are awake during the night.  The quality of your sleep.  How you feel the next day. This information will help your caregiver care for you.  Get out of bed if you are still awake after 15 minutes. Read or do some quiet activity. Keep the lights down. Wait until you feel sleepy and go back to bed.  Keep regular sleeping and waking hours. Avoid naps.  Exercise regularly.  Avoid  distractions at bedtime. Distractions include watching television or engaging in any intense or detailed activity like attempting to balance the household checkbook.  Develop a bedtime ritual. Keep a familiar routine of bathing, brushing your teeth, climbing into bed at the same time each night, listening  to soothing music. Routines increase the success of falling to sleep faster.  Use relaxation techniques. This can be using breathing and muscle tension release routines. It can also include visualizing peaceful scenes. You can also help control troubling or intruding thoughts by keeping your mind occupied with boring or repetitive thoughts like the old concept of counting sheep. You can make it more creative like imagining planting one beautiful flower after another in your backyard garden.  During your day, work to eliminate stress. When this is not possible use some of the previous suggestions to help reduce the anxiety that accompanies stressful situations. MAKE SURE YOU:   Understand these instructions.  Will watch your condition.  Will get help right away if you are not doing well or get worse. Document Released: 09/01/2000 Document Revised: 11/27/2011 Document Reviewed: 10/02/2007 [facility] Patient Information 2015 [place], [place]. This information is not intended to replace advice given to you by your health care provider. Make sure you discuss any questions you have with your health care provider.

## 2021-02-15 ENCOUNTER — Ambulatory Visit (INDEPENDENT_AMBULATORY_CARE_PROVIDER_SITE_OTHER): Payer: PRIVATE HEALTH INSURANCE | Admitting: Primary Care

## 2021-03-14 ENCOUNTER — Ambulatory Visit (INDEPENDENT_AMBULATORY_CARE_PROVIDER_SITE_OTHER): Payer: PRIVATE HEALTH INSURANCE | Admitting: Primary Care

## 2021-03-17 ENCOUNTER — Other Ambulatory Visit (INDEPENDENT_AMBULATORY_CARE_PROVIDER_SITE_OTHER): Payer: Self-pay | Admitting: Primary Care

## 2021-03-17 DIAGNOSIS — I10 Essential (primary) hypertension: Secondary | ICD-10-CM

## 2022-02-18 ENCOUNTER — Emergency Department (HOSPITAL_COMMUNITY): Payer: PRIVATE HEALTH INSURANCE

## 2022-02-18 ENCOUNTER — Encounter (HOSPITAL_COMMUNITY): Payer: Self-pay | Admitting: Emergency Medicine

## 2022-02-18 ENCOUNTER — Observation Stay (HOSPITAL_COMMUNITY)
Admission: EM | Admit: 2022-02-18 | Discharge: 2022-02-20 | Disposition: A | Discharge status: Home / Self Care | Payer: PRIVATE HEALTH INSURANCE | Attending: Internal Medicine | Admitting: Internal Medicine

## 2022-02-18 ENCOUNTER — Observation Stay (HOSPITAL_COMMUNITY): Payer: PRIVATE HEALTH INSURANCE

## 2022-02-18 ENCOUNTER — Other Ambulatory Visit: Payer: Self-pay

## 2022-02-18 DIAGNOSIS — Z8673 Personal history of transient ischemic attack (TIA), and cerebral infarction without residual deficits: Secondary | ICD-10-CM | POA: Insufficient documentation

## 2022-02-18 DIAGNOSIS — I10 Essential (primary) hypertension: Secondary | ICD-10-CM | POA: Insufficient documentation

## 2022-02-18 DIAGNOSIS — R42 Dizziness and giddiness: Secondary | ICD-10-CM | POA: Diagnosis present

## 2022-02-18 DIAGNOSIS — Z79899 Other long term (current) drug therapy: Secondary | ICD-10-CM | POA: Insufficient documentation

## 2022-02-18 DIAGNOSIS — E785 Hyperlipidemia, unspecified: Secondary | ICD-10-CM | POA: Diagnosis not present

## 2022-02-18 DIAGNOSIS — I639 Cerebral infarction, unspecified: Secondary | ICD-10-CM | POA: Diagnosis not present

## 2022-02-18 DIAGNOSIS — Z7902 Long term (current) use of antithrombotics/antiplatelets: Secondary | ICD-10-CM | POA: Insufficient documentation

## 2022-02-18 DIAGNOSIS — I63411 Cerebral infarction due to embolism of right middle cerebral artery: Secondary | ICD-10-CM | POA: Diagnosis not present

## 2022-02-18 DIAGNOSIS — K529 Noninfective gastroenteritis and colitis, unspecified: Secondary | ICD-10-CM

## 2022-02-18 DIAGNOSIS — I161 Hypertensive emergency: Secondary | ICD-10-CM | POA: Insufficient documentation

## 2022-02-18 DIAGNOSIS — Z789 Other specified health status: Secondary | ICD-10-CM

## 2022-02-18 DIAGNOSIS — Z87891 Personal history of nicotine dependence: Secondary | ICD-10-CM | POA: Diagnosis not present

## 2022-02-18 DIAGNOSIS — Z87898 Personal history of other specified conditions: Secondary | ICD-10-CM | POA: Diagnosis present

## 2022-02-18 DIAGNOSIS — Z7982 Long term (current) use of aspirin: Secondary | ICD-10-CM | POA: Diagnosis not present

## 2022-02-18 LAB — COMPREHENSIVE METABOLIC PANEL
ALT: 60 U/L — ABNORMAL HIGH (ref 0–44)
AST: 41 U/L (ref 15–41)
Albumin: 4.2 g/dL (ref 3.5–5.0)
Alkaline Phosphatase: 61 U/L (ref 38–126)
Anion gap: 8 (ref 5–15)
BUN: 10 mg/dL (ref 8–23)
CO2: 26 mmol/L (ref 22–32)
Calcium: 9 mg/dL (ref 8.9–10.3)
Chloride: 104 mmol/L (ref 98–111)
Creatinine, Ser: 0.85 mg/dL (ref 0.61–1.24)
GFR, Estimated: >60 mL/min (ref >60)
Glucose, Bld: 122 mg/dL — ABNORMAL HIGH (ref 70–99)
Potassium: 3.7 mmol/L (ref 3.5–5.1)
Sodium: 138 mmol/L (ref 135–145)
Total Bilirubin: 0.6 mg/dL (ref 0.3–1.2)
Total Protein: 7.7 g/dL (ref 6.5–8.1)

## 2022-02-18 LAB — DIFFERENTIAL
Abs Immature Granulocytes: 0.01 10*3/uL (ref 0.00–0.07)
Basophils Absolute: 0 10*3/uL (ref 0.0–0.1)
Basophils Relative: 1 %
Eosinophils Absolute: 0.1 10*3/uL (ref 0.0–0.5)
Eosinophils Relative: 2 %
Immature Granulocytes: 0 %
Lymphocytes Relative: 23 %
Lymphs Abs: 1.5 10*3/uL (ref 0.7–4.0)
Monocytes Absolute: 0.4 10*3/uL (ref 0.1–1.0)
Monocytes Relative: 6 %
Neutro Abs: 4.5 10*3/uL (ref 1.7–7.7)
Neutrophils Relative %: 68 %

## 2022-02-18 LAB — CBC
HCT: 46.5 % (ref 39.0–52.0)
Hemoglobin: 14.7 g/dL (ref 13.0–17.0)
MCH: 24 pg — ABNORMAL LOW (ref 26.0–34.0)
MCHC: 31.6 g/dL (ref 30.0–36.0)
MCV: 76 fL — ABNORMAL LOW (ref 80.0–100.0)
Platelets: 174 10*3/uL (ref 150–400)
RBC: 6.12 MIL/uL — ABNORMAL HIGH (ref 4.22–5.81)
RDW: 15.3 % (ref 11.5–15.5)
WBC: 6.6 10*3/uL (ref 4.0–10.5)
nRBC: 0 % (ref 0.0–0.2)

## 2022-02-18 LAB — PROTIME-INR
INR: 1 (ref 0.8–1.2)
Prothrombin Time: 12.6 seconds (ref 11.4–15.2)

## 2022-02-18 LAB — I-STAT CHEM 8, ED
BUN: 12 mg/dL (ref 8–23)
Calcium, Ion: 1.18 mmol/L (ref 1.15–1.40)
Chloride: 102 mmol/L (ref 98–111)
Creatinine, Ser: 0.8 mg/dL (ref 0.61–1.24)
Glucose, Bld: 120 mg/dL — ABNORMAL HIGH (ref 70–99)
HCT: 49 % (ref 39.0–52.0)
Hemoglobin: 16.7 g/dL (ref 13.0–17.0)
Potassium: 3.7 mmol/L (ref 3.5–5.1)
Sodium: 140 mmol/L (ref 135–145)
TCO2: 26 mmol/L (ref 22–32)

## 2022-02-18 LAB — APTT: aPTT: 29 seconds (ref 24–36)

## 2022-02-18 LAB — CBG MONITORING, ED: Glucose-Capillary: 126 mg/dL — ABNORMAL HIGH (ref 70–99)

## 2022-02-18 MED ORDER — ATORVASTATIN CALCIUM 40 MG PO TABS: 80.0000 mg | ORAL_TABLET | Freq: Every day | ORAL | Status: DC

## 2022-02-18 MED ORDER — IOHEXOL 350 MG/ML SOLN
50.0000 mL | Freq: Once | INTRAVENOUS | Status: AC | PRN
  Administered 2022-02-18: 50 mL via INTRAVENOUS

## 2022-02-18 MED ORDER — LORAZEPAM 2 MG/ML IJ SOLN: 1.0000 mg | INTRAMUSCULAR | Status: DC | PRN

## 2022-02-18 MED ORDER — OMEGA-3-ACID ETHYL ESTERS 1 G PO CAPS
1.0000 g | ORAL_CAPSULE | Freq: Every day | ORAL | Status: DC
  Administered 2022-02-19 – 2022-02-20 (×2): 1 g via ORAL
  Filled 2022-02-18 (×3): qty 1

## 2022-02-18 MED ORDER — AMLODIPINE BESYLATE 5 MG PO TABS
10.0000 mg | ORAL_TABLET | Freq: Once | ORAL | Status: AC
Start: 2022-02-18 — End: 2022-02-18
  Administered 2022-02-18: 10 mg via ORAL
  Filled 2022-02-18: qty 2

## 2022-02-18 MED ORDER — ACETAMINOPHEN 650 MG RE SUPP: 650.0000 mg | RECTAL | Status: DC | PRN

## 2022-02-18 MED ORDER — SODIUM CHLORIDE 0.9% FLUSH
3.0000 mL | Freq: Once | INTRAVENOUS | Status: AC
  Administered 2022-02-18: 3 mL via INTRAVENOUS

## 2022-02-18 MED ORDER — FOLIC ACID 1 MG PO TABS
1.0000 mg | ORAL_TABLET | Freq: Every day | ORAL | Status: DC
  Administered 2022-02-18 – 2022-02-20 (×3): 1 mg via ORAL
  Filled 2022-02-18 (×3): qty 1

## 2022-02-18 MED ORDER — THIAMINE HCL 100 MG/ML IJ SOLN: 100.0000 mg | Freq: Every day | INTRAMUSCULAR | Status: DC

## 2022-02-18 MED ORDER — STROKE: EARLY STAGES OF RECOVERY BOOK
Freq: Once | Status: AC
  Administered 2022-02-18: 1
  Filled 2022-02-18: qty 1

## 2022-02-18 MED ORDER — THIAMINE HCL 100 MG PO TABS
100.0000 mg | ORAL_TABLET | Freq: Every day | ORAL | Status: DC
  Administered 2022-02-18 – 2022-02-20 (×3): 100 mg via ORAL
  Filled 2022-02-18 (×3): qty 1

## 2022-02-18 MED ORDER — HYDROCHLOROTHIAZIDE 25 MG PO TABS
25.0000 mg | ORAL_TABLET | Freq: Every day | ORAL | Status: DC
  Administered 2022-02-18: 25 mg via ORAL
  Filled 2022-02-18: qty 1

## 2022-02-18 MED ORDER — ACETAMINOPHEN 160 MG/5ML PO SOLN: 650.0000 mg | ORAL | Status: DC | PRN

## 2022-02-18 MED ORDER — ASPIRIN 81 MG PO TBEC
81.0000 mg | DELAYED_RELEASE_TABLET | Freq: Every day | ORAL | Status: DC
  Administered 2022-02-19 – 2022-02-20 (×2): 81 mg via ORAL
  Filled 2022-02-18 (×2): qty 1

## 2022-02-18 MED ORDER — ENOXAPARIN SODIUM 40 MG/0.4ML IJ SOSY
40.0000 mg | PREFILLED_SYRINGE | INTRAMUSCULAR | Status: DC
  Administered 2022-02-19: 40 mg via SUBCUTANEOUS
  Filled 2022-02-18: qty 0.4

## 2022-02-18 MED ORDER — LORAZEPAM 1 MG PO TABS: 1.0000 mg | ORAL_TABLET | ORAL | Status: DC | PRN

## 2022-02-18 MED ORDER — ADULT MULTIVITAMIN W/MINERALS CH
1.0000 | ORAL_TABLET | Freq: Every day | ORAL | Status: DC
  Administered 2022-02-18 – 2022-02-20 (×3): 1 via ORAL
  Filled 2022-02-18 (×3): qty 1

## 2022-02-18 MED ORDER — ACETAMINOPHEN 325 MG PO TABS: 650.0000 mg | ORAL_TABLET | ORAL | Status: DC | PRN

## 2022-02-18 NOTE — Assessment & Plan Note (Addendum)
Appears to have been at goal in the past.  Allow for permissive hypertension overnight unless greater than 220.

## 2022-02-18 NOTE — ED Notes (Signed)
Pt ambulated to restroom without any difficulties or complaints.

## 2022-02-18 NOTE — ED Notes (Signed)
This RN updated pt's daughter Heinz Knuckles).

## 2022-02-18 NOTE — Consult Note (Signed)
Neurology Consult H&P  Paul [name] MR# [id number] 02/18/2022   CC: dizziness  History is obtained from: patient and chart.  HPI: Paul [name] is a 61 y.o. male PMHx as reviewed below presented with worsening dizziness/lightheadedness which began about 5 days ago and MRI brain 5 mm foci in the right periatrial white matter with a small evolving subacute small vessel infarct.  He has chronic abdominal pain and has had diarrhea for the past week.  LKW: unclear tNK given: No not indicated IR Thrombectomy No, not indicated Modified Rankin Scale: 0-Completely asymptomatic and back to baseline post- stroke NIHSS: 0  ROS: A complete ROS was performed and is negative except as noted in the HPI.   Past Medical History:  Diagnosis Date   Hypertension    Stroke [facility])    Urinary frequency    No family history on file.   Social History:  reports that he quit smoking about 17 months ago. He has a 15.00 pack-year smoking history. He has never used smokeless tobacco. He reports current alcohol use. He reports that he does not use drugs.   Prior to Admission medications   Medication Sig Start Date End Date Taking? Authorizing Provider  [facility] ASPIRIN LOW DOSE 81 MG EC tablet 81 mg daily. 08/26/20  Yes [provider]  metoprolol succinate (TOPROL-XL) 25 MG 24 hr tablet TAKE 1 TABLET (25 MG TOTAL) BY MOUTH DAILY. 03/17/21  Yes Grayce Sessions, NP  acetaminophen (TYLENOL) 500 MG tablet Take 1 tablet (500 mg total) by mouth every 6 (six) hours as needed. 10/06/20   Mayers, Cari S, PA-C  albuterol (VENTOLIN HFA) 108 (90 Base) MCG/ACT inhaler Inhale 1-2 puffs into the lungs every 4 (four) hours as needed for wheezing or shortness of breath. 08/26/20   Briant Cedar, MD  amLODipine (NORVASC) 10 MG tablet Take 1 tablet (10 mg total) by mouth daily. 10/25/20   Grayce Sessions, NP  atorvastatin (LIPITOR) 80 MG tablet TAKE 1 TABLET BY MOUTH EVERY DAY 10/28/20   Grayce Sessions, NP   celecoxib (CELEBREX) 100 MG capsule Take 1 capsule (100 mg total) by mouth 2 (two) times daily. 11/22/20   Grayce Sessions, NP  clopidogrel (PLAVIX) 75 MG tablet TAKE 1 TABLET BY MOUTH EVERY DAY 10/28/20   Grayce Sessions, NP  fluticasone (FLONASE) 50 MCG/ACT nasal spray Place 2 sprays into both nostrils daily. 08/26/20   Briant Cedar, MD  hydrochlorothiazide (HYDRODIURIL) 25 MG tablet Take 1 tablet (25 mg total) by mouth daily. 10/25/20   Grayce Sessions, NP  amLODipine-benazepril (LOTREL) 5-20 MG capsule Take 1 capsule by mouth daily. 01/02/16 08/04/20  [provider]    Exam: Current vital signs: BP (!) 190/110   Pulse (!) 59   Temp 98.3 F (36.8 C) (Oral)   Resp (!) 23   SpO2 99%   Physical Exam  Constitutional: Appears well-developed and well-nourished.  Psych: Affect appropriate to situation Eyes: No scleral injection HENT: No OP obstruction. Head: Normocephalic.  Cardiovascular: Normal rate and regular rhythm.  Respiratory: Effort normal, symmetric excursions bilaterally, no audible wheezing. GI: Soft.  No distension. There is no tenderness.  Skin: WDI  Neuro: Mental Status: Patient is awake, alert, oriented to person, place, month, year, and situation. Patient is able to give a clear and coherent history. Speech  fluent, intact comprehension and repetition. No signs of aphasia or neglect. Visual Fields are full. Pupils are equal, round, and reactive to light. EOMI without ptosis  or diplopia.  Facial sensation is symmetric to temperature Facial movement is symmetric.  Hearing is intact to voice. Uvula midline and palate elevates symmetrically. Shoulder shrug is symmetric. Tongue is midline without atrophy or fasciculations.  Tone is normal. Bulk is normal. 5/5 strength was present in all four extremities. Sensation is symmetric to light touch and temperature in the arms and legs. Deep Tendon Reflexes: 2+ and symmetric in the biceps and  patellae. Toes are downgoing bilaterally. FNF and HKS are intact bilaterally. Gait - Deferred  I have reviewed labs in epic and the pertinent results are: ALT 60  I have reviewed the images obtained: NCT head showed old lacunar infarcts in pons and bilateral deep periventricular white matter. CTA head and neck showed no large vessel occlusion. MRI brain showed 5 mm foci in the right periatrial white matter with a small evolving subacute small vessel infarct.  Assessment: Paul [name] is a 61 y.o. male PMHx as noted above, chronic lacunar infarctions with dizziness found to have subacute right parietal infarction.  Impression:  Subacute right parietal embolic stroke. Hypertensive emergency. Chronic lacunar infarctions - Likely hypertensive. HTN   Plan: - Recommend TTE. - Recommend labs: HbA1c, lipid panel. - Recommend Statin for goal LDL <70. - Goal A1c <7. - Aspirin 81mg  daily. - Clopidogrel 75mg  daily for 3 weeks. - SBP goal <180. - Telemetry monitoring for arrhythmia. - Recommend bedside Swallow screen. - Recommend Stroke education. - Recommend PT/OT/SLP consult.  Electronically signed by:  , MD Page: 02/18/2022, 7:42 PM  If 7pm- 7am, please page neurology on call as listed in AMION.

## 2022-02-18 NOTE — ED Provider Notes (Signed)
Accepted handoff at shift change from [facility]. Please see prior provider note for more detail.   Briefly: Patient is 61 y.o.   Hx of stroke 2021 -- presenting today with vague dizziness, nausea. No fever, some diarrhea. Right sided facial twitching -- been going on for a while. If MRI, UA clear, okay to DC -- otherwise based on symptom presentation.   I independently interpreted radiographic imaging including MR brain without contrast which showed new 5 mm hyperintensity in the right periatrial white matter concerning for evolving subacute stroke.  Spoke with neurologist to does recommend medicine admission, stroke work-up.  Spoke with the hospitalist Dr. Flossie Buffy who agrees to admission at this time.  Patient understands and agrees to plan  RISR  EDTHIS    Paul [name] 02/18/22 1957    Wyvonnia Dusky, MD 02/18/22 2109

## 2022-02-18 NOTE — H&P (Addendum)
History and Physical    Patient: Paul [name] MLY:[id number] DOB: 06/21/1961 DOA: 02/18/2022 DOS: the patient was seen and examined on 02/18/2022 PCP: Grayce Sessions, NP  Patient coming from: Home  Chief Complaint:  Chief Complaint  Patient presents with   Dizziness   Headache   HPI: Paul [name] is a 61 y.o. male with medical history significant of CVA (2021), HTN, BPH and alcohol use who presents with worsening dizziness/lightheadedness.   He reports chronic fatigue, diffuse abdomina pain for years and frequent diarrhea up to several times a day for the past month.  Diarrhea is worse with food especially spicy food.  Denies any weight loss and actually had weight gain.  Also feels chronically nauseous.  Then about 5 days ago began to feel dizziness and lightheadedness.  Had mild diffuse headaches that were intermittent.  Denies any blurry vision.  No focal extremity weakness.  He has history of tobacco use but quit in 2021.  Drinks 2-3 beers daily.  Denies illicit drug use.  Reports use of daily aspirin.  In the ED, he was afebrile and hypertensive up to 200/113, intermittent bradycardia in the 50s on room air.  No leukocytosis or anemia.  CMP with glucose of 120, normal AST, mildly elevated ALT of 60, normal total bilirubin.  Head showed chronic small vessel disease and old lacunar infarct in pons and bilateral deep periventricular white matter.  MRI brain with 5 mm foci in the right periatrial white matter with a small evolving subacute small vessel infarct.  CTA negative for large vessel occlusion.  ED PA discussed with neurology who will see in consultation.  Hospitalist contacted for admission. Review of Systems: As mentioned in the history of present illness. All other systems reviewed and are negative. Past Medical History:  Diagnosis Date   Hypertension    Stroke [facility])    Urinary frequency    Past Surgical History:  Procedure Laterality Date   COLONOSCOPY WITH PROPOFOL  N/A 01/31/2016   Procedure: COLONOSCOPY WITH PROPOFOL;  Surgeon: Charolett Bumpers, MD;  Location: [facility] ENDOSCOPY;  Service: Endoscopy;  Laterality: N/A;   NO PAST SURGERIES     Social History:  reports that he quit smoking about 17 months ago. He has a 15.00 pack-year smoking history. He has never used smokeless tobacco. He reports current alcohol use. He reports that he does not use drugs.  No Known Allergies  No family history on file.  Prior to Admission medications   Medication Sig Start Date End Date Taking? Authorizing Provider  [facility]S ASPIRIN LOW DOSE 81 MG EC tablet Take 81 mg by mouth daily. 08/26/20  Yes [provider]  MAGNESIUM PO Take 1 tablet by mouth daily with breakfast.   Yes [provider]  metoprolol succinate (TOPROL-XL) 25 MG 24 hr tablet TAKE 1 TABLET (25 MG TOTAL) BY MOUTH DAILY. 03/17/21  Yes Grayce Sessions, NP  Omega-3 Fatty Acids (FISH OIL PO) Take 1 capsule by mouth daily.   Yes [provider]  POTASSIUM PO Take 1 tablet by mouth daily with breakfast.   Yes [provider]  acetaminophen (TYLENOL) 500 MG tablet Take 1 tablet (500 mg total) by mouth every 6 (six) hours as needed. Patient not taking: Reported on 02/18/2022 10/06/20   Mayers, Cari S, PA-C  albuterol (VENTOLIN HFA) 108 (90 Base) MCG/ACT inhaler Inhale 1-2 puffs into the lungs every 4 (four) hours as needed for wheezing or shortness of breath. Patient not taking: Reported on 02/18/2022  08/26/20   Briant CedarEzenduka, Nkeiruka J, MD  amLODipine (NORVASC) 10 MG tablet Take 1 tablet (10 mg total) by mouth daily. Patient not taking: Reported on 02/18/2022 10/25/20   Grayce SessionsEdwards, Michelle P, NP  atorvastatin (LIPITOR) 80 MG tablet TAKE 1 TABLET BY MOUTH EVERY DAY Patient not taking: Reported on 02/18/2022 10/28/20   Grayce SessionsEdwards, Michelle P, NP  celecoxib (CELEBREX) 100 MG capsule Take 1 capsule (100 mg total) by mouth 2 (two) times daily. Patient not taking: Reported on 02/18/2022 11/22/20   Grayce SessionsEdwards,  Michelle P, NP  clopidogrel (PLAVIX) 75 MG tablet TAKE 1 TABLET BY MOUTH EVERY DAY Patient not taking: Reported on 02/18/2022 10/28/20   Grayce SessionsEdwards, Michelle P, NP  fluticasone [facility](FLONASE) 50 MCG/ACT nasal spray Place 2 sprays into both nostrils daily. Patient not taking: Reported on 02/18/2022 08/26/20   Briant CedarEzenduka, Nkeiruka J, MD  hydrochlorothiazide (HYDRODIURIL) 25 MG tablet Take 1 tablet (25 mg total) by mouth daily. Patient not taking: Reported on 02/18/2022 10/25/20   Grayce SessionsEdwards, Michelle P, NP  amLODipine-benazepril (LOTREL) 5-20 MG capsule Take 1 capsule by mouth daily. 01/02/16 08/04/20  [provider]    Physical Exam: Vitals:   02/18/22 1900 02/18/22 1930 02/18/22 2045 02/18/22 2100  BP: (!) 190/110 (!) 172/117 (!) 183/106 (!) 181/103  Pulse: (!) 59 70 60 65  Resp: (!) 23 19 18 20   Temp:      TempSrc:      SpO2: 99% 99% 98% 97%   Constitutional: NAD, calm, comfortable, elderly male appearing younger than stated age laying upright in bed Eyes: PERRL, lids and conjunctivae normal ENMT: Mucous membranes are moist. Posterior pharynx clear of any exudate or lesions. Neck: normal, supple Respiratory: clear to auscultation bilaterally, no wheezing, no crackles. Normal respiratory effort.   Cardiovascular: Regular rate and rhythm, no murmurs / rubs / gallops. No extremity edema.   Abdomen: Soft, moderately distended, no tenderness, no masses palpated.  Bowel sounds positive.  Musculoskeletal: no clubbing / cyanosis. No joint deformity upper and lower extremities. Good ROM, no contractures. Normal muscle tone.  Skin: no rashes, lesions, ulcers.  Neurologic: CN 2-12 grossly intact. Sensation intact, DTR normal. Strength 5/5 in all 4.  No nystagmus.  No facial asymmetry.  Equal shoulder shrug. Psychiatric: Normal judgment and insight. Alert and oriented x 3. Normal mood. Data Reviewed:  See HPI  Assessment and Plan: * CVA (cerebral vascular accident) (HCC) History of CVA in 2021 on daily  aspirin -New ischemic infarct seen on MRI at the right periatrial matter with small evolving subacute small vessel infarct -CTA head and neck negative for large vessel occlusion - echocardiogram  -Continue daily aspirin and atorvastatin -Obtain A1c and lipids -PT/OT/SLT -Frequent neuro checks and keep on telemetry -Allow for permissive hypertension with blood pressure treatment as needed only if systolic goes above 220 -Appreciate further neurology recommendation on potential to DAPT  Chronic diarrhea Month-long diarrhea worse with food.  Likely functional diarrhea but will check TSH, GI stool panel. Abd X-ray. -He also reports years of diffuse abdominal pain.Last colonoscopy in 2017 with a normal colon.  Recommend follow-up with GI for potential repeat.  Alcohol use Reports 2-3 beers nightly. -No sign withdrawal -place on CIWA protocol  HTN (hypertension) Appears to have been at goal in the past.  Allow for permissive hypertension overnight unless greater than 220.       Advance Care Planning:   Code Status: Full Code   Consults: Neurology  Family Communication: None at bedside  Severity of Illness:  The appropriate patient status for this patient is OBSERVATION. Observation status is judged to be reasonable and necessary in order to provide the required intensity of service to ensure the patient's safety. The patient's presenting symptoms, physical exam findings, and initial radiographic and laboratory data in the context of their medical condition is felt to place them at decreased risk for further clinical deterioration. Furthermore, it is anticipated that the patient will be medically stable for discharge from the hospital within 2 midnights of admission.   Author: Anselm Jungling, DO 02/18/2022 10:02 PM  For on call review [email or web address].

## 2022-02-18 NOTE — ED Notes (Signed)
Patient transported to CT 

## 2022-02-18 NOTE — Assessment & Plan Note (Signed)
Month-long diarrhea worse with food.  Likely functional diarrhea but will check TSH, GI stool panel. Abd X-ray. -He also reports years of diffuse abdominal pain.Last colonoscopy in 2017 with a normal colon.  Recommend follow-up with GI for potential repeat.

## 2022-02-18 NOTE — ED Provider Notes (Signed)
[facility] EMERGENCY DEPARTMENT Provider Note   CSN: [id number] Arrival date & time: 02/18/22  1308     History  Chief Complaint  Patient presents with   Dizziness   Headache    Paul [name] is a 61 y.o. male.  Patient with history of hypertension on multiple antihypertensives presents to the emergency department for evaluation of headache, dizziness, nausea over the past 1 week.  He reports generalized headache.  No chest pain, shortness of breath, or cough.  He denies fever, congestion, sore throat.  He has had abdominal pain at times that is intermittent.  He also reports diarrhea without blood in the stool.  He reports increased urinary frequency and dysuria at times.  States that he was told that he had a stroke in the past.  Noted to have right-sided facial twitching on exam. Patient denies signs of stroke including: facial droop, slurred speech, aphasia, weakness/numbness in extremities, imbalance/trouble walking.  He is dizziness is not described as lightheadedness, vertigo, or feeling off balance.  MRI brain 08/25/2020:  1. 1 cm acute ischemic infarct involving the right paramedian pons. Suspected associated faint petechial hemorrhage without frank hemorrhagic transformation or significant regional mass effect. 2. Additional punctate acute to subacute ischemic infarct involving the posterior right parietal centrum semi ovale.        Home Medications Prior to Admission medications   Medication Sig Start Date End Date Taking? Authorizing Provider  acetaminophen (TYLENOL) 500 MG tablet Take 1 tablet (500 mg total) by mouth every 6 (six) hours as needed. 10/06/20   Mayers, Cari S, PA-C  albuterol (VENTOLIN HFA) 108 (90 Base) MCG/ACT inhaler Inhale 1-2 puffs into the lungs every 4 (four) hours as needed for wheezing or shortness of breath. 08/26/20   Briant Cedar, MD  amLODipine (NORVASC) 10 MG tablet Take 1 tablet (10 mg total) by mouth daily. 10/25/20    Grayce Sessions, NP  atorvastatin (LIPITOR) 80 MG tablet TAKE 1 TABLET BY MOUTH EVERY DAY 10/28/20   Grayce Sessions, NP  celecoxib (CELEBREX) 100 MG capsule Take 1 capsule (100 mg total) by mouth 2 (two) times daily. 11/22/20   Grayce Sessions, NP  clopidogrel (PLAVIX) 75 MG tablet TAKE 1 TABLET BY MOUTH EVERY DAY 10/28/20   Grayce Sessions, NP  [employer] ASPIRIN LOW DOSE 81 MG EC tablet SMARTSIG:1 Tablet(s) By Mouth Daily 08/26/20   [provider]  fluticasone (FLONASE) 50 MCG/ACT nasal spray Place 2 sprays into both nostrils daily. 08/26/20   Briant Cedar, MD  hydrochlorothiazide (HYDRODIURIL) 25 MG tablet Take 1 tablet (25 mg total) by mouth daily. 10/25/20   Grayce Sessions, NP  metoprolol succinate (TOPROL-XL) 25 MG 24 hr tablet TAKE 1 TABLET (25 MG TOTAL) BY MOUTH DAILY. 03/17/21   Grayce Sessions, NP  VITAMIN D PO Take 1 tablet by mouth daily.    [provider]  amLODipine-benazepril (LOTREL) 5-20 MG capsule Take 1 capsule by mouth daily. 01/02/16 08/04/20  [provider]      Allergies    Patient has no known allergies.    Review of Systems   Review of Systems  Physical Exam Updated Vital Signs BP (!) 196/124 (BP Location: Left Arm)   Pulse 62   Temp 98.3 F (36.8 C) (Oral)   Resp 15   SpO2 97%  Physical Exam Vitals and nursing note reviewed.  Constitutional:      Appearance: He is well-developed.  HENT:  Head: Normocephalic and atraumatic.     Right Ear: Tympanic membrane, ear canal and external ear normal.     Left Ear: Tympanic membrane, ear canal and external ear normal.     Nose: Nose normal.     Mouth/Throat:     Pharynx: Uvula midline.  Eyes:     General: Lids are normal. No visual field deficit.    Conjunctiva/sclera: Conjunctivae normal.     Pupils: Pupils are equal, round, and reactive to light.  Cardiovascular:     Rate and Rhythm: Normal rate and regular rhythm.  Pulmonary:     Effort: Pulmonary effort  is normal.     Breath sounds: Normal breath sounds.  Abdominal:     Palpations: Abdomen is soft.     Tenderness: There is no abdominal tenderness.  Musculoskeletal:        General: Normal range of motion.     Cervical back: Normal range of motion and neck supple. No tenderness or bony tenderness.  Skin:    General: Skin is warm and dry.  Neurological:     Mental Status: He is alert and oriented to person, place, and time.     GCS: GCS eye subscore is 4. GCS verbal subscore is 5. GCS motor subscore is 6.     Cranial Nerves: Cranial nerve deficit present. No dysarthria.     Sensory: No sensory deficit.     Motor: No abnormal muscle tone.     Coordination: Coordination normal.     Gait: Gait normal.     Deep Tendon Reflexes: Reflexes are normal and symmetric.     Comments: Mild right-sided facial weakness with twitching, unknown chronicity    ED Results / Procedures / Treatments   Labs (all labs ordered are listed, but only abnormal results are displayed) Labs Reviewed  CBC - Abnormal; Notable for the following components:      Result Value   RBC 6.12 (*)    MCV 76.0 (*)    MCH 24.0 (*)    All other components within normal limits  I-STAT CHEM 8, ED - Abnormal; Notable for the following components:   Glucose, Bld 120 (*)    All other components within normal limits  CBG MONITORING, ED - Abnormal; Notable for the following components:   Glucose-Capillary 126 (*)    All other components within normal limits  DIFFERENTIAL  PROTIME-INR  APTT  COMPREHENSIVE METABOLIC PANEL    ED ECG REPORT   Date: 02/18/2022  Rate: 59  Rhythm: sinus bradycardia  QRS Axis: normal  Intervals: normal  ST/T Wave abnormalities: nonspecific T wave changes  Conduction Disutrbances:none  Narrative Interpretation:   Old EKG Reviewed: changes noted, improved appearance of EKG compared to December 2021 with improved T wave abnormality today  I have personally reviewed the EKG tracing and agree  with the computerized printout as noted.   Radiology No results found.  Procedures Procedures    Medications Ordered in ED Medications  sodium chloride flush (NS) 0.9 % injection 3 mL (has no administration in time range)    ED Course/ Medical Decision Making/ A&P    Patient seen and examined. History obtained directly from patient. Work-up including labs, imaging, EKG ordered in triage, if performed, were reviewed.    Labs/EKG: Independently reviewed and interpreted.  This included: CBC with differential, CMP, PT/INR, PTT.  EKG personally reviewed and interpreted.  Imaging: Head CT pending  Medications/Fluids: None ordered.  Most recent vital signs reviewed and are as  follows: BP (!) 196/124 (BP Location: Left Arm)   Pulse 62   Temp 98.3 F (36.8 C) (Oral)   Resp 15   SpO2 97%   Initial impression: Nonspecific dizziness, hypertension, nausea  3:23 PM Reassessment performed. Patient appears stable.   Labs personally reviewed and interpreted including: I-STAT Chem-8 with mildly elevated glucose otherwise unremarkable; PT normal; APTT normal; CBC with differential with normal white blood cell count, otherwise unremarkable; CMP glucose 122 otherwise unremarkable  Imaging personally visualized and interpreted including: CT of head, agree no acute findings  Reviewed pertinent lab work and imaging with patient at bedside. Questions answered.   Most current vital signs reviewed and are as follows: BP (!) 178/117   Pulse (!) 59   Temp 98.3 F (36.8 C) (Oral)   Resp 17   SpO2 97%   Plan: Patient with very vague symptoms, unrevealing work-up to this point.  Given history of stroke, will ensure no signs of posterior circulation stroke given vague dizziness symptoms.  Signout to [employer]GM MIRAGEProsperi PA-C at shift change.                            Medical Decision Making Amount and/or Complexity of Data Reviewed Labs: ordered. Radiology: ordered.   Pending completion of  work-up.  Patient without acute stroke symptoms but nausea and dizziness over the past 1 week.  He has a history of infarct in December 2021.  Given poor history and vague symptoms, plan to rule out acute/subacute CVA with MRI.        Final Clinical Impression(s) / ED Diagnoses Final diagnoses:  Dizziness    Rx / DC Orders ED Discharge Orders     None         Renne CriglerGeiple, Lailani Tool, PA-C 02/18/22 1527    Wynetta FinesMessick, Peter C, MD 02/21/22 1049

## 2022-02-18 NOTE — Assessment & Plan Note (Addendum)
History of CVA in 2021 on daily aspirin -New ischemic infarct seen on MRI at the right periatrial matter with small evolving subacute small vessel infarct -CTA head and neck negative for large vessel occlusion - echocardiogram  -Continue daily aspirin and atorvastatin -Obtain A1c and lipids -PT/OT/SLT -Frequent neuro checks and keep on telemetry -Allow for permissive hypertension with blood pressure treatment as needed only if systolic goes above 220 -Appreciate further neurology recommendation on potential to DAPT

## 2022-02-18 NOTE — ED Triage Notes (Signed)
Pt reports dizziness, mild headache, and nausea x 1 week.  History of stroke.  Reports intermittent abd pain and diarrhea for a year.

## 2022-02-18 NOTE — Assessment & Plan Note (Signed)
Reports 2-3 beers nightly. -No sign withdrawal -place on CIWA protocol

## 2022-02-19 ENCOUNTER — Observation Stay (HOSPITAL_BASED_OUTPATIENT_CLINIC_OR_DEPARTMENT_OTHER): Payer: PRIVATE HEALTH INSURANCE

## 2022-02-19 DIAGNOSIS — I10 Essential (primary) hypertension: Secondary | ICD-10-CM

## 2022-02-19 DIAGNOSIS — I639 Cerebral infarction, unspecified: Secondary | ICD-10-CM | POA: Diagnosis not present

## 2022-02-19 DIAGNOSIS — I6389 Other cerebral infarction: Secondary | ICD-10-CM | POA: Diagnosis not present

## 2022-02-19 LAB — ECHOCARDIOGRAM COMPLETE
Area-P 1/2: 2.24 cm2
Height: 62 in
S' Lateral: 3 cm
Weight: 2292.78 oz

## 2022-02-19 LAB — LIPID PANEL
Cholesterol: 183 mg/dL (ref 0–200)
HDL: 51 mg/dL (ref >40)
LDL Cholesterol: 117 mg/dL — ABNORMAL HIGH (ref 0–99)
Total CHOL/HDL Ratio: 3.6 RATIO
Triglycerides: 77 mg/dL (ref <150)
VLDL: 15 mg/dL (ref 0–40)

## 2022-02-19 LAB — HEMOGLOBIN A1C
Hgb A1c MFr Bld: 6.3 % — ABNORMAL HIGH (ref 4.8–5.6)
Mean Plasma Glucose: 134.11 mg/dL

## 2022-02-19 LAB — TSH: TSH: 1.407 u[IU]/mL (ref 0.350–4.500)

## 2022-02-19 MED ORDER — ROSUVASTATIN CALCIUM 20 MG PO TABS
40.0000 mg | ORAL_TABLET | Freq: Every day | ORAL | Status: DC
  Administered 2022-02-19 – 2022-02-20 (×2): 40 mg via ORAL
  Filled 2022-02-19 (×2): qty 2

## 2022-02-19 MED ORDER — EZETIMIBE 10 MG PO TABS
10.0000 mg | ORAL_TABLET | Freq: Every day | ORAL | Status: DC
  Administered 2022-02-19 – 2022-02-20 (×2): 10 mg via ORAL
  Filled 2022-02-19 (×2): qty 1

## 2022-02-19 MED ORDER — TICAGRELOR 90 MG PO TABS
90.0000 mg | ORAL_TABLET | Freq: Two times a day (BID) | ORAL | Status: DC
  Administered 2022-02-19 – 2022-02-20 (×3): 90 mg via ORAL
  Filled 2022-02-19 (×3): qty 1

## 2022-02-19 NOTE — Progress Notes (Addendum)
STROKE TEAM PROGRESS NOTE   INTERVAL HISTORY no family is at the bedside.  Patient states that he felt dizzy and he noticed this when he turned his head.  On exam he did not have any dizziness with head turning, however when we had him stand with his eyes closed in march he did veer to the right.  Recommend vestibular eval and orthostatic vitals. MRI scan shows a tiny right periatrial white matter lacunar infarct which likely cannot explain his symptoms.  CT angiogram shows no large vessel stenosis or occlusion.  LDL cholesterol is elevated at mg percent and hemoglobin A1c is 6.3. Vitals:   02/19/22 0800 02/19/22 0815 02/19/22 0822 02/19/22 0931  BP: 123/88 118/86    Pulse: 60 (!) 57    Resp: 15 14    Temp:   97.6 F (36.4 C)   TempSrc:   Oral   SpO2: 96% 95%    Weight:    65 kg  Height:     (1.575 m)   CBC:  Recent Labs  Lab 02/18/22 1424 02/18/22 1430  WBC 6.6  --   NEUTROABS 4.5  --   HGB 14.7 16.7  HCT 46.5 49.0  MCV 76.0*  --   PLT 174  --    Basic Metabolic Panel:  Recent Labs  Lab 02/18/22 1424 02/18/22 1430  NA 138 140  K 3.7 3.7  CL 104 102  CO2 26  --   GLUCOSE 122* 120*  BUN 10 12  CREATININE 0.85 0.80  CALCIUM 9.0  --    Lipid Panel:  Recent Labs  Lab 02/19/22 0231  CHOL 183  TRIG 77  HDL 51  CHOLHDL 3.6  VLDL 15  LDLCALC 454*   HgbA1c:  Recent Labs  Lab 02/18/22 1424  HGBA1C 6.3*   Urine Drug Screen: No results for input(s): LABOPIA, COCAINSCRNUR, LABBENZ, AMPHETMU, THCU, LABBARB in the last 168 hours.  Alcohol Level No results for input(s): ETH in the last 168 hours.  IMAGING past 24 hours CT ANGIO HEAD NECK W WO CM  Result Date: 02/18/2022 CLINICAL DATA:  Follow-up examination for acute stroke. EXAM: CT ANGIOGRAPHY HEAD AND NECK TECHNIQUE: Multidetector CT imaging of the head and neck was performed using the standard protocol during bolus administration of intravenous contrast. Multiplanar CT image reconstructions and MIPs  were obtained to evaluate the vascular anatomy. Carotid stenosis measurements (when applicable) are obtained utilizing NASCET criteria, using the distal internal carotid diameter as the denominator. RADIATION DOSE REDUCTION: This exam was performed according to the departmental dose-optimization program which includes automated exposure control, adjustment of the mA and/or kV according to patient size and/or use of iterative reconstruction technique. CONTRAST:  50mL OMNIPAQUE IOHEXOL 350 MG/ML SOLN COMPARISON:  CT and MRI from earlier the same day. FINDINGS: CTA NECK FINDINGS Aortic arch: Visualized aortic arch normal caliber with standard branch pattern. Mild atheromatous change about the arch itself. No stenosis about the origin the great vessels. Right carotid system: Right common and internal carotid arteries patent without stenosis or dissection. Mild for age atheromatous change about the right carotid bulb without stenosis. Left carotid system: Left common and internal carotid arteries patent without stenosis or dissection. Mild eccentric calcified plaque at the proximal cervical left ICA without significant stenosis. Vertebral arteries: Both vertebral arteries arise from subclavian arteries. Right vertebral artery dominant. Vertebral arteries patent without stenosis or dissection. Skeleton: No discrete or worrisome osseous lesions. Other neck: No other acute soft tissue abnormality  within the neck. Upper chest: Visualized upper chest demonstrates no acute finding. Mild paraseptal emphysematous changes present at the lung apices. Review of the MIP images confirms the above findings CTA HEAD FINDINGS Anterior circulation: Both internal carotid arteries patent to the termini without stenosis. A1 segments patent. Normal anterior communicating artery complex. Anterior cerebral arteries patent without stenosis. No M1 stenosis or occlusion. No proximal MCA branch occlusion. Distal MCA branches perfused and  symmetric. Posterior circulation: Both V4 segments patent without stenosis. Both PICA patent. Basilar patent to its distal aspect without stenosis. Superior cerebellar and posterior cerebral arteries patent bilaterally. Venous sinuses: Patent allowing for timing the contrast bolus. Anatomic variants: None significant.  No aneurysm. Review of the MIP images confirms the above findings IMPRESSION: 1. Negative CTA for large vessel occlusion or other emergent finding. 2. Mild for age atheromatous change about the carotid bifurcations and carotid siphons without hemodynamically significant or correctable stenosis. Electronically Signed   By: Rise MuBenjamin  McClintock M.D.   On: 02/18/2022 20:55   CT HEAD WO CONTRAST  Result Date: 02/18/2022 CLINICAL DATA:  Acute neurologic deficit. Headache, dizziness, and nausea. Suspected stroke. EXAM: CT HEAD WITHOUT CONTRAST TECHNIQUE: Contiguous axial images were obtained from the base of the skull through the vertex without intravenous contrast. RADIATION DOSE REDUCTION: This exam was performed according to the departmental dose-optimization program which includes automated exposure control, adjustment of the mA and/or kV according to patient size and/or use of iterative reconstruction technique. COMPARISON:  None Available. FINDINGS: Brain: No evidence of intracranial hemorrhage, acute infarction, hydrocephalus, extra-axial collection, or mass lesion/mass effect. Chronic small vessel disease is seen, with old lacunar infarcts in the pons and bilateral deep periventricular white matter. Vascular:  No hyperdense vessel or other acute findings. Skull: No evidence of fracture or other significant bone abnormality. Sinuses/Orbits:  No acute findings. Other: None. IMPRESSION: No acute intracranial abnormality. Chronic small vessel disease, with old lacunar infarcts in pons and bilateral deep periventricular white matter. Electronically Signed   By: Danae OrleansJohn A Stahl M.D.   On: 02/18/2022 14:53    MR BRAIN WO CONTRAST  Result Date: 02/18/2022 CLINICAL DATA:  Initial evaluation for acute dizziness. EXAM: MRI HEAD WITHOUT CONTRAST TECHNIQUE: Multiplanar, multiecho pulse sequences of the brain and surrounding structures were obtained without intravenous contrast. COMPARISON:  CT from earlier the same day and MRI from 08/25/2020. FINDINGS: Brain: Cerebral volume within normal limits for age. Patchy and confluent T2/FLAIR hyperintensity involving the periventricular and deep white matter of both cerebral hemispheres, most consistent with chronic small vessel ischemic disease, moderate to advanced in nature. Remote right pontine lacunar infarct noted. Few additional scattered remote lacunar infarcts present about the corona radiata bilaterally. 5 mm focus of diffusion signal abnormality seen involving the right periatrial white matter (series 2, image 27). Associated T2/FLAIR signal intensity without definite ADC correlate, likely a small evolving subacute small vessel infarct. No associated hemorrhage or mass effect. No other evidence for acute or subacute ischemia. Gray-white matter differentiation otherwise maintained. No acute intracranial hemorrhage. Multiple scattered chronic micro hemorrhages noted, most likely related to chronic poorly controlled hypertension. No mass lesion or midline shift. No hydrocephalus or extra-axial fluid collection. Pituitary gland suprasellar region normal. Vascular: Major intracranial vascular flow voids are maintained. Skull and upper cervical spine: Bone marrow signal intensity within normal limits. Normal craniocervical junction. No scalp soft tissue abnormality. Sinuses/Orbits: Globes and orbital soft tissues within normal limits. Paranasal sinuses are largely clear. Chronic left mastoid effusion. Other: None. IMPRESSION: 1. 5 mm  focus of diffusion signal abnormality involving the right periatrial white matter, consistent with a small evolving subacute small vessel  infarct. No associated hemorrhage or mass effect. 2. No other acute intracranial abnormality. 3. Moderately advanced chronic microvascular ischemic disease with multiple remote lacunar infarcts about the bilateral corona radiata and pons. Electronically Signed   By: Rise Mu M.D.   On: 02/18/2022 19:00   DG Abd Portable 1V  Result Date: 02/18/2022 CLINICAL DATA:  [id number].  Abdominal pain EXAM: PORTABLE ABDOMEN - 1 VIEW COMPARISON:  None Available. FINDINGS: Excreted intravenous contrast noted opacifying the urinary bladder lumen. The bowel gas pattern is normal. No radio-opaque calculi or other significant radiographic abnormality are seen. IMPRESSION: Negative. Electronically Signed   By: Tish Frederickson M.D.   On: 02/18/2022 22:48    PHYSICAL EXAM  Physical Exam  Constitutional: Appears frail middle-aged Asian male not in distress Cardiovascular: Normal rate and regular rhythm.  Respiratory: Effort normal, non-labored breathing  Neuro: Mental Status: Patient is awake, alert, oriented to person, place, month, year, and situation. Patient is able to give a clear and coherent history. No signs of aphasia or neglect Cranial Nerves: II: Visual Fields are full. Pupils are equal, round, and reactive to light.   III,IV, VI: EOMI without ptosis or diploplia.  V: Facial sensation is symmetric to temperature VII: Facial movement is symmetric resting and smiling VIII: Hearing is intact to voice X: Palate elevates symmetrically XI: Shoulder shrug is symmetric. XII: Tongue protrudes midline without atrophy or fasciculations.  Motor: Tone is normal. Bulk is normal. 5/5 strength was present in all four extremities.  Sensory: Sensation is symmetric to light touch and temperature in the arms and legs. No extinction to DSS present.  Deep Tendon Reflexes: 2+ and symmetric in the biceps and patellae.  Plantars: Toes are downgoing bilaterally.  Cerebellar: FNF and HKS are intact  bilaterally.Marland Kitchen  Headshaking is negative for subjective dizziness.  Fukuda test is positive with patient rotating 45 degrees to the right when marching in place with eyes closed   ASSESSMENT/PLAN Paul [name] is a 61 y.o. male with history of CVA (2021), HTN, BPH and alcohol use who presents with worsening dizziness/lightheadedness.   Stroke:  Right periatrial white matter infarct likely secondary small vessel disease source.Marland Kitchen  Positional dizziness likely due to peripheral vestibular dysfunction, BPPV of the right horizontal canal Code Stroke CT head No acute abnormality.Chronic small vessel disease, with old lacunar infarcts in pons and bilateral deep periventricular white matter. CTA head & neck Negative for LVO MRI  5 mm foci in the right periatrial white matter with a small evolving subacute small vessel infarct. 2D Echo Pending LDL 117 HgbA1c 6.3 VTE prophylaxis -Lovenox    Diet   Diet Heart Room service appropriate? Yes; Fluid consistency: Thin   aspirin 81 mg daily and clopidogrel 75 mg daily prior to admission, now on aspirin 81 mg daily and Brilinta (ticagrelor) 90 mg bid.  Therapy recommendations:  will need vestibular eval Disposition:  pending  Orthostatic vitals  Orthostatic VS for the past 24 hrs:  BP- Lying BP- Sitting BP- Standing at 0 minutes  02/19/22 1000 (!) 149/99 (!) 168/105 (!) 170/107   Hypertension Home meds: Amlodipine, hydrochlorothiazide, metoprolol succinate Stable Permissive hypertension (OK if < 220/120) but gradually normalize in 5-7 days Long-term BP goal normotensive  Hyperlipidemia Home meds: Atorvastatin Add Zetia, switched to Crestor by primary team LDL 117, goal < 70 Continue statin at discharge  Other Stroke Risk Factors Cigarette  smoker, advised to stop smoking ETOH use, alcohol level No results found for requested labs within last 09811 hours., advised to drink no more than 2 drink(s) a day CIWA protocol in place with  Ativan Thiamine, folic acid, multivitamin ordered History of stroke 08/26/2020- acute ischemic infarct involving the right paramedian pons with punctate acute to subacute ischemic infarct involving the posterior right parietal centrum semi ovale - embolic vs small vessel disease.  He was put on DAPT therapy-Per notes from PCP it appears he has been taking aspirin and Plavix since his previous stroke  Other Active Problems BPH  Hospital day # 0  Patient seen and examined by NP/APP with MD. MD to update note as needed.   Elmer Picker, DNP, FNP-BC [facility] Pager: [phone]  I have personally obtained history,examined this patient, reviewed notes, independently viewed imaging studies, participated in medical decision making and plan of care.ROS completed by me personally and pertinent positives fully documented  I have made any additions or clarifications directly to the above note. Agree with note above.  Patient presented with sudden onset of positional dizziness when turning likely related to peripheral vestibular dysfunction from benign paroxysmal positional vertigo.  MRI does show tiny right periatrial white matter lacunar infarct from small vessel disease which is likely incidental.  Recommend aspirin and Brilinta for 4 weeks followed by aspirin alone and aggressive risk factor modification.  Referral to physical therapy for vestibular rehab and dizziness exercises.  Discussed with patient and answered questions.  Discussed with Dr. Thedore Mins.  Greater than 50% time during this 50-minute visit was spent on counseling and coordination of care about his dizziness and lacunar stroke and answering questions.  Stroke team will sign off.  Can call for questions.  Delia Heady, MD Medical Director [facility] Pager: [phone] 02/19/2022 4:14 PM   To contact Stroke Continuity provider, please refer to [email or web address]. After hours, contact General Neurology

## 2022-02-19 NOTE — Evaluation (Signed)
Speech Language Pathology Evaluation Patient Details Name: Paul [name] MRN: [id number] DOB: 03/26/1961 Today's Date: 02/19/2022 Time: [id number] SLP Time Calculation (min) (ACUTE ONLY): 11 min  Problem List:  Patient Active Problem List   Diagnosis Date Noted   CVA (cerebral vascular accident) ([place]) 02/18/2022   Chronic diarrhea 02/18/2022   History of CVA in adulthood 10/07/2020   Neck pain 10/07/2020   Acute CVA (cerebrovascular accident) ([place]) 08/25/2020   HTN (hypertension) 08/25/2020   BPH (benign prostatic hyperplasia) 08/25/2020   Tobacco use 08/25/2020   Alcohol use 08/25/2020   Past Medical History:  Past Medical History:  Diagnosis Date   Hypertension    Stroke [facility])    Urinary frequency    Past Surgical History:  Past Surgical History:  Procedure Laterality Date   COLONOSCOPY WITH PROPOFOL N/A 01/31/2016   Procedure: COLONOSCOPY WITH PROPOFOL;  Surgeon: Garlan Fair, MD;  Location: [facility];  Service: Endoscopy;  Laterality: N/A;   NO PAST SURGERIES     HPI:  61 y.o.male, originally from [place], has lived in [place] for thirty years and is proficient in [place]. He presented with worsening dizziness/lightheadedness. MRI revealed right parietal subacute embolic stroke; old lacunar infarcts in pons and bilateral deep white matter.  PMHx HTN, 2021 CVA.   Assessment / Plan / Recommendation Clinical Impression  Pt presents with normal orientation, attention, awareness, memory and problem-solving. Speech is clear and fluent. Expressive and receptive language are intact. No SLP f/u is needed. He is WNL. Our service will sign off.    SLP Assessment  SLP Recommendation/Assessment: Patient does not need any further Speech [place] Pathology Services SLP Visit Diagnosis: Cognitive communication deficit (R41.841)    Recommendations for follow up therapy are one component of a multi-disciplinary discharge planning process, led by the attending physician.  Recommendations  may be updated based on patient status, additional functional criteria and insurance authorization.    Follow Up Recommendations  No SLP follow up                       SLP Evaluation Cognition  Overall Cognitive Status: Within Functional Limits for tasks assessed Arousal/Alertness: Awake/alert Orientation Level: Oriented X4 Attention: Selective Selective Attention: Appears intact Memory: Appears intact Awareness: Appears intact Safety/Judgment: Appears intact       Comprehension  Auditory Comprehension Overall Auditory Comprehension: Appears within functional limits for tasks assessed    Expression Expression Primary Mode of Expression: Verbal Verbal Expression Overall Verbal Expression: Appears within functional limits for tasks assessed Written Expression Dominant Hand: Right   Oral / Motor  Oral Motor/Sensory Function Overall Oral Motor/Sensory Function: Within functional limits Motor Speech Overall Motor Speech: Appears within functional limits for tasks assessed           Paul [name] L. Tivis Ringer, [place] CCC/SLP Acute Rehabilitation Services Office number [phone] Pager [phone]  Paul [name] 02/19/2022, 11:45 AM

## 2022-02-19 NOTE — Evaluation (Signed)
Occupational Therapy Evaluation Patient Details Name: Paul [name] MRN: [id number] DOB: 12-09-1960 Today's Date: 02/19/2022   History of Present Illness Pt is a 61 y/o M presenting to ED with dizziness/headach/nausea x1week. 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 PMH includes CVA (2021), HTN, and urinary frequency.   Clinical Impression   Pt independent at baseline with ADLs and functional mobility, currently mod I - min guard for ADLs, mod I for bed mobility, and supervision for transfers without AD. Pt orthostatics taken during session (see vitals flowsheet) however pt asymptomatic. Pt presenting with impairments listed below, will follow acutely. Recommend d/c home with family assistance.      Recommendations for follow up therapy are one component of a multi-disciplinary discharge planning process, led by the attending physician.  Recommendations may be updated based on patient status, additional functional criteria and insurance authorization.   Follow Up Recommendations  No OT follow up    Assistance Recommended at Discharge PRN  Patient can return home with the following Direct supervision/assist for medications management;Direct supervision/assist for financial management;Assistance with cooking/housework    Functional Status Assessment  Patient has had a recent decline in their functional status and demonstrates the ability to make significant improvements in function in a reasonable and predictable amount of time.  Equipment Recommendations  None recommended by OT    Recommendations for Other Services PT consult     Precautions / Restrictions Restrictions Weight Bearing Restrictions: No      Mobility Bed Mobility Overal bed mobility: Modified Independent                  Transfers Overall transfer level: Needs assistance Equipment used: None Transfers: Sit to/from Stand Sit to Stand:  Supervision                  Balance Overall balance assessment: Mild deficits observed, not formally tested                                         ADL either performed or assessed with clinical judgement   ADL Overall ADL's : Needs assistance/impaired Eating/Feeding: Modified independent;Sitting   Grooming: Modified independent;Standing;Sitting   Upper Body Bathing: Modified independent;Sitting   Lower Body Bathing: Min guard;Sitting/lateral leans   Upper Body Dressing : Modified independent;Sitting;Standing   Lower Body Dressing: Min guard;Sitting/lateral leans;Sit to/from stand   Toilet Transfer: Ambulation;Regular Network engineer Details (indicate cue type and reason): simulated in room Toileting- Clothing Manipulation and Hygiene: Supervision/safety;Sitting/lateral lean;Sit to/from stand       Functional mobility during ADLs: Supervision/safety       Vision Baseline Vision/History: 1 Wears glasses Vision Assessment?: No apparent visual deficits Additional Comments: pt able to identify numbers held up in peripheral and central visual fields     Perception     Praxis      Pertinent Vitals/Pain Pain Assessment Pain Assessment: No/denies pain     Hand Dominance     Extremity/Trunk Assessment Upper Extremity Assessment Upper Extremity Assessment: Overall WFL for tasks assessed   Lower Extremity Assessment Lower Extremity Assessment: Defer to PT evaluation   Cervical / Trunk Assessment Cervical / Trunk Assessment: Normal   Communication Communication Communication: No difficulties   Cognition Arousal/Alertness: Awake/alert Behavior During Therapy: WFL for tasks assessed/performed Overall Cognitive Status: Within Functional Limits for tasks assessed  General Comments: pt able to follow single and multistep commands without difficulty     General Comments   VSS on RA    Exercises     Shoulder Instructions      Home Living Family/patient expects to be discharged to:: Private residence Living Arrangements: Children Available Help at Discharge: Family;Available PRN/intermittently Type of Home: House Home Access: Stairs to enter [employer] of Steps: 3 Entrance Stairs-Rails: None Home Layout: Two level     Bathroom Shower/Tub: Tub/shower unit;Curtain         Home Equipment: None          Prior Functioning/Environment Prior Level of Function : Independent/Modified Independent             Mobility Comments: no AD ADLs Comments: does IADLs        OT Problem List:        OT Treatment/Interventions: Therapeutic exercise;Self-care/ADL training;Therapeutic activities;Patient/family education;Visual/perceptual remediation/compensation    OT Goals(Current goals can be found in the care plan section) Acute Rehab OT Goals Patient Stated Goal: none stated OT Goal Formulation: With patient Time For Goal Achievement: 03/05/22 Potential to Achieve Goals: Good ADL Goals Pt Will Perform Upper Body Dressing: Independently;sitting Pt Will Perform Lower Body Dressing: Independently;sitting/lateral leans;sit to/from stand Pt Will Transfer to Toilet: Independently;ambulating;regular height toilet Pt Will Perform Tub/Shower Transfer: Tub transfer;Shower transfer;ambulating  OT Frequency: Min 2X/week    Co-evaluation              AM-PAC OT "6 Clicks" Daily Activity     Outcome Measure Help from another person eating meals?: None Help from another person taking care of personal grooming?: None Help from another person toileting, which includes using toliet, bedpan, or urinal?: None Help from another person bathing (including washing, rinsing, drying)?: A Little Help from another person to put on and taking off regular upper body clothing?: None Help from another person to put on and taking off regular lower body  clothing?: A Little 6 Click Score: 22   End of Session Nurse Communication: Mobility status  Activity Tolerance: Patient tolerated treatment well Patient left: Other (comment) (Standing in room, handoff to PT)  OT Visit Diagnosis: Unsteadiness on feet (R26.81);Other abnormalities of gait and mobility (R26.89);Muscle weakness (generalized) (M62.81)                Time: 1749-4496 OT Time Calculation (min): 11 min Charges:  OT General Charges $OT Visit: 1 Visit OT Evaluation $OT Eval Low Complexity: 1 Low  Alfonzo Beers, OTD, OTR/L Acute Rehab [phone]  Mayer Masker 02/19/2022, 10:24 AM

## 2022-02-19 NOTE — Progress Notes (Signed)
PROGRESS NOTE                                                                                                                                                                                                             Patient Demographics:    Paul [name], is a 61 y.o. male, DOB - 1960-11-02, [id number]  Outpatient Primary MD for the patient is Kerin Perna, NP    LOS - 0  Admit date - 02/18/2022    Chief Complaint  Patient presents with   Dizziness   Headache       Brief Narrative (HPI from H&P)   61 y.o. male with medical history significant of CVA (2021), HTN, BPH and alcohol use who presents with worsening dizziness/lightheadedness.    Subjective:    [name] has, No headache, No chest pain, No abdominal pain - No Nausea, No new weakness tingling or numbness, no SOB.   Assessment  & Plan :   CVA (cerebral vascular accident) ([place])  - History of CVA in 2021 on daily aspirin, now new ischemic infarct seen on MRI at the right periatrial matter with small evolving subacute small vessel infarct, CTA head and neck negative for large vessel occlusion, full stroke work-up, neurology on board, now on DAPT, LDL was above goal hence switched to Crestor and Zetia combination from max dose Lipitor.  Per PT will require vestibular rehab evaluation tomorrow.  Chronic diarrhea Month-long diarrhea worse with food.  Likely functional diarrhea but will check TSH, GI stool panel. Abd X-ray. -He also reports years of diffuse abdominal pain.Last colonoscopy in 2017 with a normal colon.  Recommend follow-up with GI for potential repeat.  Alcohol use Reports 2-3 beers nightly. -No sign withdrawal -placed on CIWA protocol counseled to quit  HTN (hypertension) Appears to have been at goal in the past.  Allow for permissive hypertension overnight unless greater than 220.   Lab Results  Component Value Date   HGBA1C 6.3  (H) 02/18/2022    Lab Results  Component Value Date   CHOL 183 02/19/2022   HDL 51 02/19/2022   LDLCALC 117 (H) 02/19/2022   TRIG 77 02/19/2022   CHOLHDL 3.6 02/19/2022          Condition - Extremely Guarded  Family Communication  :  None present  Code Status :  Full  Consults  :  Neuro  PUD Prophylaxis :    Procedures  :     MRI - 5 mm focus of diffusion signal abnormality involving the right periatrial white matter, consistent with a small evolving subacute small vessel infarct. No associated hemorrhage or mass effect. 2. No other acute intracranial abnormality. 3. Moderately advanced chronic microvascular ischemic disease with multiple remote lacunar infarcts about the bilateral corona radiata and pons  CTA - 1. Negative CTA for large vessel occlusion or other emergent finding. 2. Mild for age atheromatous change about the carotid bifurcations and carotid siphons without hemodynamically significant or correctable stenosis  TTE  -       Disposition Plan  :    Status is: Observation  DVT Prophylaxis  :    enoxaparin (LOVENOX) injection 40 mg Start: 02/18/22 2200  Lab Results  Component Value Date   PLT 174 02/18/2022    Diet :  Diet Order             Diet Heart Room service appropriate? Yes; Fluid consistency: Thin  Diet effective now                    Inpatient Medications  Scheduled Meds:  aspirin EC  81 mg Oral Daily   enoxaparin (LOVENOX) injection  40 mg Subcutaneous Q24H   ezetimibe  10 mg Oral Daily   folic acid  1 mg Oral Daily   multivitamin with minerals  1 tablet Oral Daily   omega-3 acid ethyl esters  1 g Oral Daily   rosuvastatin  40 mg Oral Daily   thiamine  100 mg Oral Daily   Or   thiamine  100 mg Intravenous Daily   ticagrelor  90 mg Oral BID   Continuous Infusions: PRN Meds:.acetaminophen **OR** acetaminophen (TYLENOL) oral liquid 160 mg/5 mL **OR** acetaminophen, LORazepam **OR** LORazepam  Antibiotics  :     Anti-infectives (From admission, onward)    None        Time Spent in minutes  30   Lala Lund M.D on 02/19/2022 at 12:17 PM  To page go to [email or web address]   [facility] -  Office  [phone]  See all Orders from [name] for further details    Objective:   Vitals:   02/19/22 0800 02/19/22 0815 02/19/22 0822 02/19/22 0931  BP: 123/88 118/86    Pulse: 60 (!) 57    Resp: 15 14    Temp:   97.6 F (36.4 C)   TempSrc:   Oral   SpO2: 96% 95%    Weight:    65 kg  Height:    5\' 2"  (1.575 m)    Wt Readings from Last 3 Encounters:  02/19/22 65 kg  01/03/21 62 kg  11/22/20 60.7 kg     Intake/Output Summary (Last 24 hours) at 02/19/2022 1217 Last data filed at 02/19/2022 0900 Gross per 24 hour  Intake 240 ml  Output --  Net 240 ml     Physical Exam  Awake Alert, No new F.N deficits, Normal affect [place].AT,PERRAL Supple Neck, No JVD,   Symmetrical Chest wall movement, Good air movement bilaterally, CTAB RRR,No Gallops,Rubs or new Murmurs,  +ve B.Sounds, Abd Soft, No tenderness,   No Cyanosis, Clubbing or edema       Data Review:    CBC Recent Labs  Lab 02/18/22 1424 02/18/22 1430  WBC 6.6  --   HGB 14.7 16.7  HCT 46.5 49.0  PLT  174  --   MCV 76.0*  --   MCH 24.0*  --   MCHC 31.6  --   RDW 15.3  --   LYMPHSABS 1.5  --   MONOABS 0.4  --   EOSABS 0.1  --   BASOSABS 0.0  --     Electrolytes Recent Labs  Lab 02/18/22 1424 02/18/22 1430 02/19/22 0231  NA 138 140  --   K 3.7 3.7  --   CL 104 102  --   CO2 26  --   --   GLUCOSE 122* 120*  --   BUN 10 12  --   CREATININE 0.85 0.80  --   CALCIUM 9.0  --   --   AST 41  --   --   ALT 60*  --   --   ALKPHOS 61  --   --   BILITOT 0.6  --   --   ALBUMIN 4.2  --   --   INR 1.0  --   --   TSH  --   --  1.407  HGBA1C 6.3*  --   --     ------------------------------------------------------------------------------------------------------------------ Recent Labs    02/19/22 0231  CHOL  183  HDL 51  LDLCALC 117*  TRIG 77  CHOLHDL 3.6    Lab Results  Component Value Date   HGBA1C 6.3 (H) 02/18/2022    Recent Labs    02/19/22 0231  TSH 1.407   ------------------------------------------------------------------------------------------------------------------ ID Labs Recent Labs  Lab 02/18/22 1424 02/18/22 1430  WBC 6.6  --   PLT 174  --   CREATININE 0.85 0.80       Micro Results No results found for this or any previous visit (from the past 240 hour(s)).  Radiology Reports CT ANGIO HEAD NECK W WO CM  Result Date: 02/18/2022 CLINICAL DATA:  Follow-up examination for acute stroke. EXAM: CT ANGIOGRAPHY HEAD AND NECK TECHNIQUE: Multidetector CT imaging of the head and neck was performed using the standard protocol during bolus administration of intravenous contrast. Multiplanar CT image reconstructions and MIPs were obtained to evaluate the vascular anatomy. Carotid stenosis measurements (when applicable) are obtained utilizing NASCET criteria, using the distal internal carotid diameter as the denominator. RADIATION DOSE REDUCTION: This exam was performed according to the departmental dose-optimization program which includes automated exposure control, adjustment of the mA and/or kV according to patient size and/or use of iterative reconstruction technique. CONTRAST:  33mL OMNIPAQUE IOHEXOL 350 MG/ML SOLN COMPARISON:  CT and MRI from earlier the same day. FINDINGS: CTA NECK FINDINGS Aortic arch: Visualized aortic arch normal caliber with standard branch pattern. Mild atheromatous change about the arch itself. No stenosis about the origin the great vessels. Right carotid system: Right common and internal carotid arteries patent without stenosis or dissection. Mild for age atheromatous change about the right carotid bulb without stenosis. Left carotid system: Left common and internal carotid arteries patent without stenosis or dissection. Mild eccentric calcified plaque at  the proximal cervical left ICA without significant stenosis. Vertebral arteries: Both vertebral arteries arise from subclavian arteries. Right vertebral artery dominant. Vertebral arteries patent without stenosis or dissection. Skeleton: No discrete or worrisome osseous lesions. Other neck: No other acute soft tissue abnormality within the neck. Upper chest: Visualized upper chest demonstrates no acute finding. Mild paraseptal emphysematous changes present at the lung apices. Review of the MIP images confirms the above findings CTA HEAD FINDINGS Anterior circulation: Both internal carotid arteries patent to the termini without  stenosis. A1 segments patent. Normal anterior communicating artery complex. Anterior cerebral arteries patent without stenosis. No M1 stenosis or occlusion. No proximal MCA branch occlusion. Distal MCA branches perfused and symmetric. Posterior circulation: Both V4 segments patent without stenosis. Both PICA patent. Basilar patent to its distal aspect without stenosis. Superior cerebellar and posterior cerebral arteries patent bilaterally. Venous sinuses: Patent allowing for timing the contrast bolus. Anatomic variants: None significant.  No aneurysm. Review of the MIP images confirms the above findings IMPRESSION: 1. Negative CTA for large vessel occlusion or other emergent finding. 2. Mild for age atheromatous change about the carotid bifurcations and carotid siphons without hemodynamically significant or correctable stenosis. Electronically Signed   By: Jeannine Boga M.D.   On: 02/18/2022 20:55   CT HEAD WO CONTRAST  Result Date: 02/18/2022 CLINICAL DATA:  Acute neurologic deficit. Headache, dizziness, and nausea. Suspected stroke. EXAM: CT HEAD WITHOUT CONTRAST TECHNIQUE: Contiguous axial images were obtained from the base of the skull through the vertex without intravenous contrast. RADIATION DOSE REDUCTION: This exam was performed according to the departmental dose-optimization  program which includes automated exposure control, adjustment of the mA and/or kV according to patient size and/or use of iterative reconstruction technique. COMPARISON:  None Available. FINDINGS: Brain: No evidence of intracranial hemorrhage, acute infarction, hydrocephalus, extra-axial collection, or mass lesion/mass effect. Chronic small vessel disease is seen, with old lacunar infarcts in the pons and bilateral deep periventricular white matter. Vascular:  No hyperdense vessel or other acute findings. Skull: No evidence of fracture or other significant bone abnormality. Sinuses/Orbits:  No acute findings. Other: None. IMPRESSION: No acute intracranial abnormality. Chronic small vessel disease, with old lacunar infarcts in pons and bilateral deep periventricular white matter. Electronically Signed   By: Marlaine Hind M.D.   On: 02/18/2022 14:53   MR BRAIN WO CONTRAST  Result Date: 02/18/2022 CLINICAL DATA:  Initial evaluation for acute dizziness. EXAM: MRI HEAD WITHOUT CONTRAST TECHNIQUE: Multiplanar, multiecho pulse sequences of the brain and surrounding structures were obtained without intravenous contrast. COMPARISON:  CT from earlier the same day and MRI from 08/25/2020. FINDINGS: Brain: Cerebral volume within normal limits for age. Patchy and confluent T2/FLAIR hyperintensity involving the periventricular and deep white matter of both cerebral hemispheres, most consistent with chronic small vessel ischemic disease, moderate to advanced in nature. Remote right pontine lacunar infarct noted. Few additional scattered remote lacunar infarcts present about the corona radiata bilaterally. 5 mm focus of diffusion signal abnormality seen involving the right periatrial white matter (series 2, image 27). Associated T2/FLAIR signal intensity without definite ADC correlate, likely a small evolving subacute small vessel infarct. No associated hemorrhage or mass effect. No other evidence for acute or subacute ischemia.  Gray-white matter differentiation otherwise maintained. No acute intracranial hemorrhage. Multiple scattered chronic micro hemorrhages noted, most likely related to chronic poorly controlled hypertension. No mass lesion or midline shift. No hydrocephalus or extra-axial fluid collection. Pituitary gland suprasellar region normal. Vascular: Major intracranial vascular flow voids are maintained. Skull and upper cervical spine: Bone marrow signal intensity within normal limits. Normal craniocervical junction. No scalp soft tissue abnormality. Sinuses/Orbits: Globes and orbital soft tissues within normal limits. Paranasal sinuses are largely clear. Chronic left mastoid effusion. Other: None. IMPRESSION: 1. 5 mm focus of diffusion signal abnormality involving the right periatrial white matter, consistent with a small evolving subacute small vessel infarct. No associated hemorrhage or mass effect. 2. No other acute intracranial abnormality. 3. Moderately advanced chronic microvascular ischemic disease with multiple remote lacunar infarcts  about the bilateral corona radiata and pons. Electronically Signed   By: Jeannine Boga M.D.   On: 02/18/2022 19:00   DG Abd Portable 1V  Result Date: 02/18/2022 CLINICAL DATA:  [id number].  Abdominal pain EXAM: PORTABLE ABDOMEN - 1 VIEW COMPARISON:  None Available. FINDINGS: Excreted intravenous contrast noted opacifying the urinary bladder lumen. The bowel gas pattern is normal. No radio-opaque calculi or other significant radiographic abnormality are seen. IMPRESSION: Negative. Electronically Signed   By: Iven Finn M.D.   On: 02/18/2022 22:48

## 2022-02-19 NOTE — Progress Notes (Signed)
  Echocardiogram 2D Echocardiogram has been performed.  Delcie Roch 02/19/2022, 1:49 PM

## 2022-02-19 NOTE — Progress Notes (Signed)
Physical Therapy to address Vestibular Evaluation ordered 6/4.

## 2022-02-19 NOTE — Evaluation (Signed)
Physical Therapy Evaluation Patient Details Name: Paul [name] MRN: [id number] DOB: 15-Feb-1961 Today's Date: 02/19/2022  History of Present Illness  Pt is a 61 y/o M presenting to ED with dizziness/headach/nausea x1week. 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 PMH includes CVA (2021), HTN, and urinary frequency.  Clinical Impression   Pt admitted with above diagnosis. Lives at home with family, in a multi-level home with a few steps to enter and a flight to reach 2nd floor; Prior to admission, pt was able to manage independently; Presents to PT with mild balance deficits, will benefit from a closer look at his vestibular function;  Pt currently with functional limitations due to the deficits listed below (see PT Problem List). Pt will benefit from skilled PT to increase their independence and safety with mobility to allow discharge to the venue listed below.          Recommendations for follow up therapy are one component of a multi-disciplinary discharge planning process, led by the attending physician.  Recommendations may be updated based on patient status, additional functional criteria and insurance authorization.  Follow Up Recommendations Outpatient PT (Further vestibular work)    Assistance Recommended at Discharge PRN  Patient can return home with the following       Equipment Recommendations None recommended by PT  Recommendations for Other Services  Other (comment) (Vestibular Assessment)    Functional Status Assessment Patient has had a recent decline in their functional status and demonstrates the ability to make significant improvements in function in a reasonable and predictable amount of time.     Precautions / Restrictions Restrictions Weight Bearing Restrictions: No      Mobility  Bed Mobility Overal bed mobility: Modified Independent                  Transfers Overall transfer  level: Needs assistance Equipment used: None Transfers: Sit to/from Stand Sit to Stand: Supervision           General transfer comment: Cues to self-monitor for activity tolerance    Ambulation/Gait Ambulation/Gait assistance: Supervision Gait Distance (Feet): 350 Feet Assistive device: None Gait Pattern/deviations: Step-through pattern       General Gait Details: Overall steady with uncomplicated amb; did not report difficulty with head turns, however noted small deviations in path with head turns and head nods; no loss of balance  Stairs Stairs: Yes Stairs assistance: Min guard (without physical contact) Stair Management: No rails, One rail Left, Forwards Number of Stairs: 12 General stair comments: no rails and alternating steps ascending; used L rail descending  Wheelchair Mobility    Modified Rankin (Stroke Patients Only)       Balance Overall balance assessment: Mild deficits observed, not formally tested                                           Pertinent Vitals/Pain Pain Assessment Pain Assessment: No/denies pain    Home Living Family/patient expects to be discharged to:: Private residence Living Arrangements: Children Available Help at Discharge: Family;Available PRN/intermittently Type of Home: House Home Access: Stairs to enter Entrance Stairs-Rails: None Entrance Stairs-Number of Steps: 3   Home Layout: Two level Home Equipment: None      Prior Function Prior Level of Function : Independent/Modified Independent  Mobility Comments: no AD ADLs Comments: does IADLs     Hand Dominance   Dominant Hand: Right    Extremity/Trunk Assessment   Upper Extremity Assessment Upper Extremity Assessment: Defer to OT evaluation    Lower Extremity Assessment Lower Extremity Assessment: Overall WFL for tasks assessed    Cervical / Trunk Assessment Cervical / Trunk Assessment: Normal  Communication    Communication: No difficulties  Cognition Arousal/Alertness: Awake/alert Behavior During Therapy: WFL for tasks assessed/performed Overall Cognitive Status: Within Functional Limits for tasks assessed                                 General Comments: pt able to follow single and multistep commands without difficulty        General Comments General comments (skin integrity, edema, etc.): VSS on RA    Exercises     Assessment/Plan    PT Assessment Patient needs continued PT services  PT Problem List Decreased coordination;Decreased balance       PT Treatment Interventions Gait training;DME instruction;Stair training;Functional mobility training;Therapeutic activities;Therapeutic exercise;Balance training;Neuromuscular re-education;Cognitive remediation;Patient/family education    PT Goals (Current goals can be found in the Care Plan section)  Acute Rehab PT Goals Patient Stated Goal: Would like a different PCP PT Goal Formulation: With patient Time For Goal Achievement: 03/05/22 Potential to Achieve Goals: Good    Frequency Min 3X/week     Co-evaluation               AM-PAC PT "6 Clicks" Mobility  Outcome Measure Help needed turning from your back to your side while in a flat bed without using bedrails?: None Help needed moving from lying on your back to sitting on the side of a flat bed without using bedrails?: None Help needed moving to and from a bed to a chair (including a wheelchair)?: None Help needed standing up from a chair using your arms (e.g., wheelchair or bedside chair)?: None Help needed to walk in hospital room?: None Help needed climbing 3-5 steps with a railing? : None 6 Click Score: 24    End of Session   Activity Tolerance: Patient tolerated treatment well Patient left: in bed;with call bell/phone within reach (sitting EOB) Nurse Communication: Mobility status PT Visit Diagnosis: Unsteadiness on feet (R26.81);Dizziness and  giddiness (R42)    Time: 9449-6759 PT Time Calculation (min) (ACUTE ONLY): 17 min   Charges:   PT Evaluation $PT Eval Low Complexity: 1 Low          Van Clines, PT  Acute Rehabilitation Services Office [phone]   Levi Aland 02/19/2022, 12:26 PM

## 2022-02-20 ENCOUNTER — Other Ambulatory Visit (HOSPITAL_COMMUNITY): Payer: Self-pay

## 2022-02-20 DIAGNOSIS — I639 Cerebral infarction, unspecified: Secondary | ICD-10-CM | POA: Diagnosis not present

## 2022-02-20 DIAGNOSIS — K529 Noninfective gastroenteritis and colitis, unspecified: Secondary | ICD-10-CM | POA: Diagnosis not present

## 2022-02-20 DIAGNOSIS — R42 Dizziness and giddiness: Secondary | ICD-10-CM

## 2022-02-20 MED ORDER — EZETIMIBE 10 MG PO TABS
10.0000 mg | ORAL_TABLET | Freq: Every day | ORAL | 0 refills | Status: DC
  Filled 2022-02-20: qty 30, 30d supply, fill #0

## 2022-02-20 MED ORDER — THIAMINE HCL 100 MG PO TABS
100.0000 mg | ORAL_TABLET | Freq: Every day | ORAL | 0 refills | Status: DC
  Filled 2022-02-20: qty 30, 30d supply, fill #0

## 2022-02-20 MED ORDER — FOLIC ACID 1 MG PO TABS
1.0000 mg | ORAL_TABLET | Freq: Every day | ORAL | 0 refills | Status: DC
  Filled 2022-02-20: qty 30, 30d supply, fill #0

## 2022-02-20 MED ORDER — TICAGRELOR 90 MG PO TABS
90.0000 mg | ORAL_TABLET | Freq: Two times a day (BID) | ORAL | 0 refills | Status: DC
  Filled 2022-02-20: qty 60, 30d supply, fill #0

## 2022-02-20 MED ORDER — AMLODIPINE BESYLATE 10 MG PO TABS
10.0000 mg | ORAL_TABLET | Freq: Every day | ORAL | 0 refills | Status: DC
  Filled 2022-02-20: qty 30, 30d supply, fill #0

## 2022-02-20 MED ORDER — METOPROLOL SUCCINATE ER 25 MG PO TB24
25.0000 mg | ORAL_TABLET | Freq: Every day | ORAL | 3 refills | Status: DC
  Filled 2022-02-20: qty 90, 90d supply, fill #0

## 2022-02-20 MED ORDER — MECLIZINE HCL 25 MG PO TABS
25.0000 mg | ORAL_TABLET | Freq: Three times a day (TID) | ORAL | 0 refills | Status: DC | PRN
  Filled 2022-02-20: qty 30, 10d supply, fill #0

## 2022-02-20 MED ORDER — ATORVASTATIN CALCIUM 80 MG PO TABS
80.0000 mg | ORAL_TABLET | Freq: Every day | ORAL | 0 refills | Status: DC
  Filled 2022-02-20: qty 30, 30d supply, fill #0

## 2022-02-20 NOTE — Evaluation (Signed)
Occupational Therapy Evaluation Patient Details Name: Paul [name] MRN: [id number] DOB: 07-12-1961 Today's Date: 02/20/2022   History of Present Illness Pt is a 61 y/o M presenting to ED with dizziness/headach/nausea x1week. 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 PMH includes CVA (2021), HTN, and urinary frequency.   Clinical Impression   Pt progressing towards goals, able to complete ADLs, bed mobility, and transfers without AD. Pt presenting mild difficulty with navigational task, requiring min cues for redirection. Able to read paragraph on menu without difficulty, reports no visual changes. Pt presenting with impairments listed below, will follow acutely to maximize safety and independence with ADLs/functional mobility.     Recommendations for follow up therapy are one component of a multi-disciplinary discharge planning process, led by the attending physician.  Recommendations may be updated based on patient status, additional functional criteria and insurance authorization.   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 Restrictions Weight Bearing Restrictions: No      Mobility Bed Mobility Overal bed mobility: Independent             General bed mobility comments: no use of bed rail    Transfers Overall transfer level: Modified independent Equipment used: None Transfers: Sit to/from Stand Sit to Stand: Modified  independent (Device/Increase time)           General transfer comment: no difficulty, no impulsive      Balance Overall balance assessment: Mild deficits observed, not formally tested                                         ADL either performed or assessed with clinical judgement   ADL Overall ADL's : Needs assistance/impaired     Grooming: Modified independent;Standing Grooming Details (indicate cue type and reason): brushing teeth at sink         Upper Body Dressing : Modified independent;Sitting;Standing Upper Body Dressing Details (indicate cue type and reason): dons shirt/doffs gown Lower Body Dressing: Modified independent;Sitting/lateral leans;Sit to/from stand Lower Body Dressing Details (indicate cue type and reason): dons/doffs shoes Toilet Transfer: Independent;Ambulation;Regular Glass blower/designer Details (indicate cue type and reason): simulated in room         Functional mobility during ADLs: Modified independent       Vision   Vision Assessment?: No apparent visual deficits Additional Comments: pt able to read paragraph without difficutly     Perception Perception Perception: Not tested   Praxis Praxis Praxis: Intact    Pertinent Vitals/Pain Pain Assessment Pain Assessment: No/denies pain     Hand Dominance     Extremity/Trunk Assessment Upper Extremity Assessment Upper Extremity Assessment: Overall WFL for tasks assessed   Lower Extremity Assessment Lower Extremity Assessment: Defer to PT evaluation       Communication     Cognition Arousal/Alertness: Awake/alert Behavior During Therapy: WFL for tasks assessed/performed Overall Cognitive Status: Within Functional Limits for tasks assessed  General Comments  VSS on RA    Exercises     Shoulder Instructions      Home Living                                          Prior  Functioning/Environment                          OT Problem List:        OT Treatment/Interventions:      OT Goals(Current goals can be found in the care plan section) Acute Rehab OT Goals Patient Stated Goal: none stated OT Goal Formulation: With patient Time For Goal Achievement: 03/05/22 Potential to Achieve Goals: Good ADL Goals Pt Will Perform Upper Body Dressing: Independently;sitting Pt Will Perform Lower Body Dressing: Independently;sitting/lateral leans;sit to/from stand Pt Will Transfer to Toilet: Independently;ambulating;regular height toilet Pt Will Perform Tub/Shower Transfer: Tub transfer;Shower transfer;ambulating  OT Frequency: Min 2X/week    Co-evaluation              AM-PAC OT "6 Clicks" Daily Activity     Outcome Measure Help from another person eating meals?: None Help from another person taking care of personal grooming?: None Help from another person toileting, which includes using toliet, bedpan, or urinal?: None Help from another person bathing (including washing, rinsing, drying)?: A Little Help from another person to put on and taking off regular upper body clothing?: None Help from another person to put on and taking off regular lower body clothing?: A Little 6 Click Score: 22   End of Session Nurse Communication: Mobility status  Activity Tolerance: Patient tolerated treatment well Patient left: in bed;with call bell/phone within reach (sitting EOB)  OT Visit Diagnosis: Unsteadiness on feet (R26.81);Other abnormalities of gait and mobility (R26.89);Muscle weakness (generalized) (M62.81)                Time: 0943-1000 OT Time Calculation (min): 17 min Charges:  OT General Charges $OT Visit: 1 Visit OT Treatments $Self Care/Home Management : 8-22 mins  Lynnda Child, OTD, OTR/L Acute Rehab [phone]) [phone] - [place] 02/20/2022, 10:29 AM

## 2022-02-20 NOTE — Discharge Instructions (Signed)
Follow with Primary MD Grayce Sessions, NP in 7 days   Get CBC, CMP, Magnesium, 2 view Chest X ray -  checked next visit within 1 week by Primary MD    Activity: As tolerated with Full fall precautions use walker/cane & assistance as needed  Disposition Home    Diet: Heart Healthy    Special Instructions: If you have smoked or chewed Tobacco  in the last 2 yrs please stop smoking, stop any regular Alcohol  and or any Recreational drug use.  On your next visit with your primary care physician please Get Medicines reviewed and adjusted.  Please request your Prim.MD to go over all Hospital Tests and Procedure/Radiological results at the follow up, please get all Hospital records sent to your Prim MD by signing hospital release before you go home.  If you experience worsening of your admission symptoms, develop shortness of breath, life threatening emergency, suicidal or homicidal thoughts you must seek medical attention immediately by calling 911 or calling your MD immediately  if symptoms less severe.  You Must read complete instructions/literature along with all the possible adverse reactions/side effects for all the Medicines you take and that have been prescribed to you. Take any new Medicines after you have completely understood and accpet all the possible adverse reactions/side effects.

## 2022-02-20 NOTE — Progress Notes (Signed)
Physical Therapy Treatment Patient Details Name: Paul [name] MRN: [id number] DOB: [birth year] Today's Date: 02/20/2022   History of Present Illness Pt is a 61 y/o M presenting to ED with dizziness/headach/nausea x1week. 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 PMH includes CVA (2021), HTN, and urinary frequency.    PT Comments    Completed bilat horizontal roll test and bilat dix hallpike, pt denied dizziness, no nystagmus noted. 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 Pt with over shooting to the R and correcting back towards the left to midline. Pt given VORx1 exercises with written instructions. Pt with return demonstration and good understanding. Pt to continue to benefit from outpt vestibular therapy to address R hypofunction. Pt otherwise functioning at supervision level and is able to amb safely without AD and complete flight of stairs without hand rail. Acute PT to cont to follow.   Recommendations for follow up therapy are one component of a multi-disciplinary discharge planning process, led by the attending physician.  Recommendations may be updated based on patient status, additional functional criteria and insurance authorization.  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 Restrictions Weight Bearing Restrictions: No     Mobility  Bed Mobility Overal bed mobility: Independent             General bed mobility comments: no use of bed rail    Transfers Overall transfer level: Modified  independent Equipment used: None Transfers: Sit to/from Stand Sit to Stand: Modified independent (Device/Increase time)           General transfer comment: no difficulty, no impulsive    Ambulation/Gait Ambulation/Gait assistance: Supervision Gait Distance (Feet): 500 Feet Assistive device: None Gait Pattern/deviations: Step-through pattern, WFL(Within Functional Limits) Gait velocity: slow Gait velocity interpretation: >2.62 ft/sec, indicative of community ambulatory   General Gait Details: pt steady, no instability with vertical or horizontal head turns   Stairs Stairs: Yes Stairs assistance: Supervision (without physical contact) Stair Management: No rails Number of Stairs: 12 General stair comments: no rails for ascending or descending to mimic home set up   Wheelchair Mobility    Modified Rankin (Stroke Patients Only) Modified Rankin (Stroke Patients Only) Pre-Morbid Rankin Score: No symptoms Modified Rankin: Slight disability     Balance Overall balance assessment: Mild deficits observed, not formally tested                               Standardized Balance Assessment Standardized Balance Assessment : Dynamic Gait Index   Dynamic Gait Index Level Surface: Normal Change in Gait Speed: Normal Gait with Horizontal Head Turns: Normal Gait with Vertical Head Turns: Normal Gait and Pivot Turn: Normal Step Over Obstacle: Normal Step Around Obstacles: Mild Impairment Steps: Normal Total Score: 23      Cognition Arousal/Alertness: Awake/alert Behavior During Therapy: WFL for tasks assessed/performed Overall Cognitive Status: Within Functional Limits for tasks assessed  General Comments: pt with difficulty following commands for DGI administration but suspect it to be related to a mild language barrier, once given demonstration pt able to follow.        Exercises Other Exercises Other  Exercises: pt given VORx1 exercises    General Comments General comments (skin integrity, edema, etc.): VSS on RA      Pertinent Vitals/Pain Pain Assessment Pain Assessment: No/denies pain    Home Living                          Prior Function            PT Goals (current goals can now be found in the care plan section) Acute Rehab PT Goals PT Goal Formulation: With patient Time For Goal Achievement: 03/05/22 Potential to Achieve Goals: Good Progress towards PT goals: Progressing toward goals    Frequency    Min 3X/week      PT Plan Current plan remains appropriate    Co-evaluation              AM-PAC PT "6 Clicks" Mobility   Outcome Measure  Help needed turning from your back to your side while in a flat bed without using bedrails?: None Help needed moving from lying on your back to sitting on the side of a flat bed without using bedrails?: None Help needed moving to and from a bed to a chair (including a wheelchair)?: None Help needed standing up from a chair using your arms (e.g., wheelchair or bedside chair)?: None Help needed to walk in hospital room?: None Help needed climbing 3-5 steps with a railing? : None 6 Click Score: 24    End of Session Equipment Utilized During Treatment: Gait belt Activity Tolerance: Patient tolerated treatment well Patient left: in bed;with call bell/phone within reach (sitting EOB) Nurse Communication: Mobility status PT Visit Diagnosis: Unsteadiness on feet (R26.81);Dizziness and giddiness (R42)     Time: 3329-5188 PT Time Calculation (min) (ACUTE ONLY): 27 min  Charges:  $Gait Training: 8-22 mins $[place] Rep Proc: 8-22 mins                     Paul [name], PT, DPT Acute Rehabilitation Services Secure chat preferred Office #: [phone]    Paul [name] 02/20/2022, 9:53 AM

## 2022-02-20 NOTE — Progress Notes (Signed)
Pt admitted for stroke work-up,  verified presence of stroke. Pt participated in care plan and therapies.  Heart monitoring stopped. IV in r8ght AC is discontinued with tip intact, no s/s of infection and clean guaze dressing in place with no bleeding.  All personal belongings returned, verified by patient and packed in bag at bedside.  Discharged to home via personal car with significant other at his side.

## 2022-02-20 NOTE — Discharge Summary (Signed)
Paul [name] EUM:[id number] DOB: June 30, 1961 DOA: 02/18/2022  PCP: Grayce Sessions, NP  Admit date: 02/18/2022  Discharge date: 02/20/2022  Admitted From: Home   Disposition:  Home   Recommendations for Outpatient Follow-up:   Follow up with PCP in 1-2 weeks  PCP Please obtain BMP/CBC, 2 view CXR in 1week,  (see Discharge instructions)   PCP Please follow up on the following pending results: Monitor secondary risk factors for CVA, noncompliant with medications.  Monitor alcohol use/abuse.  Outpatient GI follow-up for chronic diarrhea and chronic intermittent abdominal discomfort.  Home Health: Outpatient PT for vestibular rehab Equipment/Devices: Walker Consultations: Neuro Discharge Condition: Stable    CODE STATUS: Full    Diet Recommendation: Heart Healthy   Diet Order             Diet - low sodium heart healthy           Diet Heart Room service appropriate? Yes; Fluid consistency: Thin  Diet effective now                    Chief Complaint  Patient presents with   Dizziness   Headache     Brief history of present illness from the day of admission and additional interim summary    61 y.o. male with medical history significant of CVA (2021), HTN, BPH and alcohol use who presents with worsening dizziness/lightheadedness.  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  He was seen by neurology and underwent full stroke work-up.  Currently close to his baseline.                                                                 Hospital Course   Dizziness with incidental CVA (cerebral vascular accident) (HCC) and possible BPV  - History of CVA in 2021 on daily aspirin, now new ischemic infarct seen on MRI at the right periatrial matter with small evolving subacute small vessel  infarct, CTA head and neck negative for large vessel occlusion, stroke likely incidental, 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 Pearlean Brownie and PT, outpatient vestibular rehab will be arranged by case management.  Currently close to his baseline will be discharged home with outpatient PCP and neurology follow-up.   Chronic diarrhea Month-long diarrhea worse with food.  Currently stable question if he is developing pancreatic insufficiency from alcohol use, recommend follow-up with GI will be arranged by PCP.  Alcohol use Reports 2-3 beers nightly. -Currently no signs of DTs, strictly counseled to quit alcohol.   HTN (hypertension) Appears to have been at goal in the past.  Allow for permissive hypertension, blood pressure medications adjusted as below, of note patient is noncompliant with his blood pressure medications PCP to monitor closely.  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  PCP to monitor A1c 6.3.  Dyslipidemia.  Noncompliant with Lipitor, counseled, resume home dose Lipitor, also Zetia added.   Discharge diagnosis     Principal Problem:   CVA (cerebral vascular accident) (HCC) Active Problems:   HTN (hypertension)   Alcohol use   Chronic diarrhea    Discharge instructions    Discharge Instructions     Diet - low sodium heart healthy   Complete by: As directed    Discharge instructions   Complete by: As directed    Follow with Primary MD Grayce Sessions, NP in 7 days   Get CBC, CMP, Magnesium, 2 view Chest X ray -  checked next visit within 1 week by Primary MD    Activity: As tolerated with Full fall  precautions use walker/cane & assistance as needed  Disposition Home    Diet: Heart Healthy    Special Instructions: If you have smoked or chewed Tobacco  in the last 2 yrs please stop smoking, stop any regular Alcohol  and or any Recreational drug use.  On your next visit with your primary care physician please Get Medicines reviewed and adjusted.  Please request your Prim.MD to go over all Hospital Tests and Procedure/Radiological results at the follow up, please get all Hospital records sent to your Prim MD by signing hospital release before you go home.  If you experience worsening of your admission symptoms, develop shortness of breath, life threatening emergency, suicidal or homicidal thoughts you must seek medical attention immediately by calling 911 or calling your MD immediately  if symptoms less severe.  You Must read complete instructions/literature along with all the possible adverse reactions/side effects for all the Medicines you take and that have been prescribed to you. Take any new Medicines after you have completely understood and accpet all the possible adverse reactions/side effects.   Increase activity slowly   Complete by: As directed        Discharge Medications   Allergies as of 02/20/2022   No Known Allergies      Medication List     STOP taking these medications    acetaminophen 500 MG tablet Commonly known as: TYLENOL   albuterol 108 (90 Base) MCG/ACT inhaler Commonly known as: VENTOLIN HFA   celecoxib 100 MG capsule Commonly known as: CeleBREX   clopidogrel 75 MG tablet Commonly known as: PLAVIX   [employer] Aspirin Low Dose 81 MG tablet Generic drug: aspirin EC   fluticasone 50 MCG/ACT nasal spray Commonly known as: FLONASE   hydrochlorothiazide 25 MG tablet Commonly known as: HYDRODIURIL   POTASSIUM PO       TAKE these medications    amLODipine 10 MG tablet Commonly known as: NORVASC Take 1 tablet (10 mg total) by mouth daily. Start  taking on: February 21, 2022   atorvastatin 80 MG tablet Commonly known as: LIPITOR Take 1 tablet (80 mg total) by mouth daily.   ezetimibe 10 MG tablet Commonly known as: ZETIA Take 1 tablet (10 mg total) by mouth daily.   FISH OIL PO Take 1 capsule by mouth daily.   folic acid 1 MG tablet Commonly known as: [employer]  Take 1 tablet (1 mg total) by mouth daily.   MAGNESIUM PO Take 1 tablet by mouth daily with breakfast.   meclizine 25 MG tablet Commonly known as: ANTIVERT Take 1 tablet (25 mg total) by mouth 3 (three) times daily as needed for dizziness.   metoprolol succinate 25 MG 24 hr tablet Commonly known as: TOPROL-XL Take 1 tablet (25 mg total) by mouth daily.   thiamine 100 MG tablet Take 1 tablet (100 mg total) by mouth daily.   ticagrelor 90 MG Tabs tablet Commonly known as: BRILINTA Take 1 tablet (90 mg total) by mouth 2 (two) times daily.               Durable Medical Equipment  (From admission, onward)           Start     Ordered   Unscheduled  For home use only DME Walker rolling  Once       Comments: 5 wheel  Question Answer Comment  Walker: With 5 Inch Wheels   Patient needs a walker to treat with the following condition Weakness      02/20/22 0848             Follow-up Information     Grayce Sessions, NP. Schedule an appointment as soon as possible for a visit in 1 week(s).   Specialty: Internal Medicine Contact information: [address] [place] [address] [phone]         [facility]. Schedule an appointment as soon as possible for a visit in 2 week(s).   Contact information: [address]     [address] [place] [address] [phone]                Major procedures and Radiology Reports - PLEASE review detailed and final reports thoroughly  -       CT ANGIO HEAD NECK W WO CM  Result Date: 02/18/2022 CLINICAL DATA:  Follow-up examination for acute stroke.  EXAM: CT ANGIOGRAPHY HEAD AND NECK TECHNIQUE: Multidetector CT imaging of the head and neck was performed using the standard protocol during bolus administration of intravenous contrast. Multiplanar CT image reconstructions and MIPs were obtained to evaluate the vascular anatomy. Carotid stenosis measurements (when applicable) are obtained utilizing NASCET criteria, using the distal internal carotid diameter as the denominator. RADIATION DOSE REDUCTION: This exam was performed according to the departmental dose-optimization program which includes automated exposure control, adjustment of the mA and/or kV according to patient size and/or use of iterative reconstruction technique. CONTRAST:  50mL OMNIPAQUE IOHEXOL 350 MG/ML SOLN COMPARISON:  CT and MRI from earlier the same day. FINDINGS: CTA NECK FINDINGS Aortic arch: Visualized aortic arch normal caliber with standard branch pattern. Mild atheromatous change about the arch itself. No stenosis about the origin the great vessels. Right carotid system: Right common and internal carotid arteries patent without stenosis or dissection. Mild for age atheromatous change about the right carotid bulb without stenosis. Left carotid system: Left common and internal carotid arteries patent without stenosis or dissection. Mild eccentric calcified plaque at the proximal cervical left ICA without significant stenosis. Vertebral arteries: Both vertebral arteries arise from subclavian arteries. Right vertebral artery dominant. Vertebral arteries patent without stenosis or dissection. Skeleton: No discrete or worrisome osseous lesions. Other neck: No other acute soft tissue abnormality within the neck. Upper chest: Visualized upper chest demonstrates no acute finding. Mild paraseptal emphysematous changes present at the lung apices. Review of the MIP images confirms the  above findings CTA HEAD FINDINGS Anterior circulation: Both internal carotid arteries patent to the termini without  stenosis. A1 segments patent. Normal anterior communicating artery complex. Anterior cerebral arteries patent without stenosis. No M1 stenosis or occlusion. No proximal MCA branch occlusion. Distal MCA branches perfused and symmetric. Posterior circulation: Both V4 segments patent without stenosis. Both PICA patent. Basilar patent to its distal aspect without stenosis. Superior cerebellar and posterior cerebral arteries patent bilaterally. Venous sinuses: Patent allowing for timing the contrast bolus. Anatomic variants: None significant.  No aneurysm. Review of the MIP images confirms the above findings IMPRESSION: 1. Negative CTA for large vessel occlusion or other emergent finding. 2. Mild for age atheromatous change about the carotid bifurcations and carotid siphons without hemodynamically significant or correctable stenosis. Electronically Signed   By: Rise Mu M.D.   On: 02/18/2022 20:55   CT HEAD WO CONTRAST  Result Date: 02/18/2022 CLINICAL DATA:  Acute neurologic deficit. Headache, dizziness, and nausea. Suspected stroke. EXAM: CT HEAD WITHOUT CONTRAST TECHNIQUE: Contiguous axial images were obtained from the base of the skull through the vertex without intravenous contrast. RADIATION DOSE REDUCTION: This exam was performed according to the departmental dose-optimization program which includes automated exposure control, adjustment of the mA and/or kV according to patient size and/or use of iterative reconstruction technique. COMPARISON:  None Available. FINDINGS: Brain: No evidence of intracranial hemorrhage, acute infarction, hydrocephalus, extra-axial collection, or mass lesion/mass effect. Chronic small vessel disease is seen, with old lacunar infarcts in the pons and bilateral deep periventricular white matter. Vascular:  No hyperdense vessel or other acute findings. Skull: No evidence of fracture or other significant bone abnormality. Sinuses/Orbits:  No acute findings. Other: None.  IMPRESSION: No acute intracranial abnormality. Chronic small vessel disease, with old lacunar infarcts in pons and bilateral deep periventricular white matter. Electronically Signed   By: Danae Orleans M.D.   On: 02/18/2022 14:53   MR BRAIN WO CONTRAST  Result Date: 02/18/2022 CLINICAL DATA:  Initial evaluation for acute dizziness. EXAM: MRI HEAD WITHOUT CONTRAST TECHNIQUE: Multiplanar, multiecho pulse sequences of the brain and surrounding structures were obtained without intravenous contrast. COMPARISON:  CT from earlier the same day and MRI from 08/25/2020. FINDINGS: Brain: Cerebral volume within normal limits for age. Patchy and confluent T2/FLAIR hyperintensity involving the periventricular and deep white matter of both cerebral hemispheres, most consistent with chronic small vessel ischemic disease, moderate to advanced in nature. Remote right pontine lacunar infarct noted. Few additional scattered remote lacunar infarcts present about the corona radiata bilaterally. 5 mm focus of diffusion signal abnormality seen involving the right periatrial white matter (series 2, image 27). Associated T2/FLAIR signal intensity without definite ADC correlate, likely a small evolving subacute small vessel infarct. No associated hemorrhage or mass effect. No other evidence for acute or subacute ischemia. Gray-white matter differentiation otherwise maintained. No acute intracranial hemorrhage. Multiple scattered chronic micro hemorrhages noted, most likely related to chronic poorly controlled hypertension. No mass lesion or midline shift. No hydrocephalus or extra-axial fluid collection. Pituitary gland suprasellar region normal. Vascular: Major intracranial vascular flow voids are maintained. Skull and upper cervical spine: Bone marrow signal intensity within normal limits. Normal craniocervical junction. No scalp soft tissue abnormality. Sinuses/Orbits: Globes and orbital soft tissues within normal limits. Paranasal  sinuses are largely clear. Chronic left mastoid effusion. Other: None. IMPRESSION: 1. 5 mm focus of diffusion signal abnormality involving the right periatrial white matter, consistent with a small evolving subacute small vessel infarct. No associated hemorrhage or mass effect. 2.  No other acute intracranial abnormality. 3. Moderately advanced chronic microvascular ischemic disease with multiple remote lacunar infarcts about the bilateral corona radiata and pons. Electronically Signed   By: Rise Mu M.D.   On: 02/18/2022 19:00   DG Abd Portable 1V  Result Date: 02/18/2022 CLINICAL DATA:  [id number].  Abdominal pain EXAM: PORTABLE ABDOMEN - 1 VIEW COMPARISON:  None Available. FINDINGS: Excreted intravenous contrast noted opacifying the urinary bladder lumen. The bowel gas pattern is normal. No radio-opaque calculi or other significant radiographic abnormality are seen. IMPRESSION: Negative. Electronically Signed   By: Tish Frederickson M.D.   On: 02/18/2022 22:48   ECHOCARDIOGRAM COMPLETE  Result Date: 02/19/2022    ECHOCARDIOGRAM REPORT   Patient Name:   Paul [name] Date of Exam: 02/19/2022 Medical Rec #:  [id number]   Height:       62.0 in Accession #:    [id number]  Weight:       143.3 lb Date of Birth:  May 12, 1961    BSA:          1.659 m Patient Age:    61 years    BP:           118/86 mmHg Patient Gender: M           HR:           51 bpm. Exam Location:  Inpatient Procedure: 2D Echo Indications:    stroke  History:        Patient has prior history of Echocardiogram examinations, most                 recent 08/25/2020. Risk Factors:Former Smoker and Hypertension.  Sonographer:    Delcie Roch RDCS Referring Phys: [id number] CHING T TU IMPRESSIONS  1. Left ventricular ejection fraction, by estimation, is 65 to 70%. The left ventricle has normal function. The left ventricle has no regional wall motion abnormalities. There is mild concentric left ventricular hypertrophy. Left ventricular diastolic  parameters are consistent with Grade I diastolic dysfunction (impaired relaxation).  2. Right ventricular systolic function is normal. The right ventricular size is normal. Tricuspid regurgitation signal is inadequate for assessing PA pressure.  3. Left atrial size was mildly dilated.  4. The mitral valve is normal in structure. Mild mitral valve regurgitation.  5. The aortic valve is tricuspid. Aortic valve regurgitation is not visualized. No aortic stenosis is present. FINDINGS  Left Ventricle: Left ventricular ejection fraction, by estimation, is 65 to 70%. The left ventricle has normal function. The left ventricle has no regional wall motion abnormalities. The left ventricular internal cavity size was normal in size. There is  mild concentric left ventricular hypertrophy. Left ventricular diastolic parameters are consistent with Grade I diastolic dysfunction (impaired relaxation). Normal left ventricular filling pressure. Right Ventricle: The right ventricular size is normal. No increase in right ventricular wall thickness. Right ventricular systolic function is normal. Tricuspid regurgitation signal is inadequate for assessing PA pressure. Left Atrium: Left atrial size was mildly dilated. Right Atrium: Right atrial size was normal in size. Pericardium: There is no evidence of pericardial effusion. Mitral Valve: The mitral valve is normal in structure. Mild mitral valve regurgitation. Tricuspid Valve: The tricuspid valve is normal in structure. Tricuspid valve regurgitation is not demonstrated. Aortic Valve: The aortic valve is tricuspid. Aortic valve regurgitation is not visualized. No aortic stenosis is present. Pulmonic Valve: The pulmonic valve was normal in structure. Pulmonic valve regurgitation is trivial. Aorta: The aortic root is normal in size and structure.  Venous: The inferior vena cava was not well visualized. IAS/Shunts: No atrial level shunt detected by color flow Doppler.  LEFT VENTRICLE PLAX 2D  LVIDd:         4.90 cm   Diastology LVIDs:         3.00 cm   LV e' medial:    6.23 cm/s LV PW:         1.20 cm   LV E/e' medial:  6.4 LV IVS:        1.20 cm   LV e' lateral:   6.15 cm/s LVOT diam:     2.20 cm   LV E/e' lateral: 6.5 LV SV:         68 LV SV Index:   41 LVOT Area:     3.80 cm  RIGHT VENTRICLE RV S prime:     10.70 cm/s TAPSE (M-mode): 1.3 cm LEFT ATRIUM             Index        RIGHT ATRIUM           Index LA diam:        3.90 cm 2.35 cm/m   RA Area:     11.00 cm LA Vol (A2C):   41.9 ml 25.25 ml/m  RA Volume:   22.60 ml  13.62 ml/m LA Vol (A4C):   47.2 ml 28.45 ml/m LA Biplane Vol: 45.5 ml 27.42 ml/m  AORTIC VALVE LVOT Vmax:   81.80 cm/s LVOT Vmean:  49.300 cm/s LVOT VTI:    0.178 m  AORTA Ao Root diam: 3.50 cm Ao Asc diam:  3.50 cm MITRAL VALVE MV Area (PHT): 2.24 cm    SHUNTS MV Decel Time: 338 msec    Systemic VTI:  0.18 m MV E velocity: 40.10 cm/s  Systemic Diam: 2.20 cm MV A velocity: 83.10 cm/s MV E/A ratio:  0.48 Mihai Croitoru MD Electronically signed by Thurmon Fair MD Signature Date/Time: 02/19/2022/2:37:12 PM    Final        Today   Subjective    Paul [name] today has no headache,no chest abdominal pain,no new weakness tingling or numbness, feels much better wants to go home today.     Objective   Blood pressure (!) 161/98, pulse 61, temperature 98.2 F (36.8 C), temperature source Oral, resp. rate 16, height  (1.575 m), weight 65 kg, SpO2 98 %.   Intake/Output Summary (Last 24 hours) at 02/20/2022 0848 Last data filed at 02/19/2022 0900 Gross per 24 hour  Intake 240 ml  Output --  Net 240 ml    Exam  Awake Alert, No new F.N deficits,    [place].AT,PERRAL Supple Neck,   Symmetrical Chest wall movement, Good air movement bilaterally, CTAB RRR,No Gallops,   +ve B.Sounds, Abd Soft, Non tender,  No Cyanosis, Clubbing or edema    Data Review   Recent Labs  Lab 02/18/22 1424 02/18/22 1430  WBC 6.6  --   HGB 14.7 16.7  HCT 46.5 49.0  PLT 174  --    MCV 76.0*  --   MCH 24.0*  --   MCHC 31.6  --   RDW 15.3  --   LYMPHSABS 1.5  --   MONOABS 0.4  --   EOSABS 0.1  --   BASOSABS 0.0  --     Recent Labs  Lab 02/18/22 1424 02/18/22 1430 02/19/22 0231  NA 138 140  --   K 3.7 3.7  --  CL 104 102  --   CO2 26  --   --   GLUCOSE 122* 120*  --   BUN 10 12  --   CREATININE 0.85 0.80  --   CALCIUM 9.0  --   --   AST 41  --   --   ALT 60*  --   --   ALKPHOS 61  --   --   BILITOT 0.6  --   --   ALBUMIN 4.2  --   --   INR 1.0  --   --   TSH  --   --  1.407  HGBA1C 6.3*  --   --    Lab Results  Component Value Date   CHOL 183 02/19/2022   HDL 51 02/19/2022   LDLCALC 117 (H) 02/19/2022   TRIG 77 02/19/2022   CHOLHDL 3.6 02/19/2022     Total Time in preparing paper work, data evaluation and todays exam - 35 minutes  Susa RaringPrashant Kayvan Hoefling M.D on 02/20/2022 at 8:48 AM  [facility]

## 2022-02-20 NOTE — TOC Transition Note (Addendum)
Transition of Care [facility]) - CM/SW Discharge Note   Patient Details  Name: Paul [name] MRN: [id number] Date of Birth: 10-06-1960  [facility] CM/SW Contact:  Harriet Masson, RN Phone Number: 02/20/2022, 10:03 AM   Clinical Narrative:    Patient stable for discharge. Patient agreeable to outpatient PT. Referral sent to [facility] outpatient rehab on [address]. Physical therapy states patient doesn't need walker. Patient has transportation home. No other TOC needs.   Final next level of care: OP Rehab Barriers to Discharge: Barriers Resolved   Patient Goals and CMS Choice Patient states their goals for this hospitalization and ongoing recovery are:: return home      Discharge Placement               home        Discharge Plan and Services                       Home with outpt rehab              Social Determinants of Health (SDOH) Interventions     Readmission Risk Interventions     View : No data to display.

## 2022-02-21 ENCOUNTER — Telehealth: Payer: Self-pay

## 2022-02-21 NOTE — Telephone Encounter (Signed)
Transition Care Management Follow-up Telephone Call Date of discharge and from where: 02/20/2022, [facility] How have you been since you were released from the hospital? He stated he is feeling okay, no dizziness. He reports occasional nausea when he takes his medications.  Any questions or concerns? No  Items Reviewed: Did the pt receive and understand the discharge instructions provided? Yes  Medications obtained and verified? Yes - he said he has all of his medications and did not have any questions about the med regime.  Other? No  Any new allergies since your discharge? No  Dietary orders reviewed? Yes Do you have support at home? Yes - he is staying with family.   Home Care and Equipment/Supplies: Were home health services ordered? no If so, what is the name of the agency? N/a  Has the agency set up a time to come to the patient's home? not applicable Were any new equipment or medical supplies ordered?  No What is the name of the medical supply agency? N/a Were you able to get the supplies/equipment? not applicable Do you have any questions related to the use of the equipment or supplies? No  Functional Questionnaire: (I = Independent and D = Dependent) ADLs: independent.  Follow up appointments reviewed:  PCP Hospital f/u appt confirmed? Yes  Scheduled to see Dr Delford Field, MD - 03/06/2022. Patient prefers a male PCP.  Specialist Hospital f/u appt confirmed?  None scheduled at this time    Are transportation arrangements needed? No  If their condition worsens, is the pt aware to call PCP or go to the Emergency Dept.? Yes Was the patient provided with contact information for the PCP's office or ED? Yes- the information was text to him as he requested.  Was to pt encouraged to call back with questions or concerns? Yes

## 2022-02-23 ENCOUNTER — Other Ambulatory Visit (HOSPITAL_COMMUNITY): Payer: Self-pay

## 2022-03-06 ENCOUNTER — Ambulatory Visit: Payer: PRIVATE HEALTH INSURANCE | Attending: Critical Care Medicine | Admitting: Critical Care Medicine

## 2022-03-06 ENCOUNTER — Encounter: Payer: Self-pay | Admitting: Critical Care Medicine

## 2022-03-06 VITALS — BP 142/82 | HR 73 | Wt 145.0 lb

## 2022-03-06 DIAGNOSIS — R350 Frequency of micturition: Secondary | ICD-10-CM

## 2022-03-06 DIAGNOSIS — Z1159 Encounter for screening for other viral diseases: Secondary | ICD-10-CM

## 2022-03-06 DIAGNOSIS — I639 Cerebral infarction, unspecified: Secondary | ICD-10-CM

## 2022-03-06 DIAGNOSIS — K529 Noninfective gastroenteritis and colitis, unspecified: Secondary | ICD-10-CM

## 2022-03-06 DIAGNOSIS — Z789 Other specified health status: Secondary | ICD-10-CM

## 2022-03-06 DIAGNOSIS — N401 Enlarged prostate with lower urinary tract symptoms: Secondary | ICD-10-CM

## 2022-03-06 DIAGNOSIS — R918 Other nonspecific abnormal finding of lung field: Secondary | ICD-10-CM | POA: Diagnosis not present

## 2022-03-06 DIAGNOSIS — I10 Essential (primary) hypertension: Secondary | ICD-10-CM | POA: Diagnosis not present

## 2022-03-06 DIAGNOSIS — E782 Mixed hyperlipidemia: Secondary | ICD-10-CM

## 2022-03-06 DIAGNOSIS — Z8673 Personal history of transient ischemic attack (TIA), and cerebral infarction without residual deficits: Secondary | ICD-10-CM

## 2022-03-06 DIAGNOSIS — H7292 Unspecified perforation of tympanic membrane, left ear: Secondary | ICD-10-CM

## 2022-03-06 DIAGNOSIS — Z87891 Personal history of nicotine dependence: Secondary | ICD-10-CM

## 2022-03-06 MED ORDER — AMLODIPINE BESYLATE 10 MG PO TABS: 10.0000 mg | ORAL_TABLET | Freq: Every day | ORAL | 2 refills | Status: DC

## 2022-03-06 MED ORDER — TAMSULOSIN HCL 0.4 MG PO CAPS: 0.4000 mg | ORAL_CAPSULE | Freq: Every day | ORAL | 3 refills | Status: DC

## 2022-03-06 MED ORDER — ATORVASTATIN CALCIUM 80 MG PO TABS: 80.0000 mg | ORAL_TABLET | Freq: Every day | ORAL | 2 refills | Status: DC

## 2022-03-06 MED ORDER — EZETIMIBE 10 MG PO TABS: 10.0000 mg | ORAL_TABLET | Freq: Every day | ORAL | 2 refills | Status: DC

## 2022-03-06 NOTE — Progress Notes (Deleted)
Established Patient Office Visit  Subjective   Patient ID: Paul [name], male    DOB: [birth year]  Age: 61 y.o. MRN: [id number]  No chief complaint on file.   HPI TOC visit   RN note:  Transition Care Management Follow-up Telephone Call Date of discharge and from where: 02/20/2022, [facility] How have you been since you were released from the hospital? He stated he is feeling okay, no dizziness. He reports occasional nausea when he takes his medications.  Any questions or concerns? No   Items Reviewed: Did the pt receive and understand the discharge instructions provided? Yes  Medications obtained and verified? Yes - he said he has all of his medications and did not have any questions about the med regime.  Other? No  Any new allergies since your discharge? No  Dietary orders reviewed? Yes Do you have support at home? Yes - he is staying with family.    Home Care and Equipment/Supplies: Were home health services ordered? no If so, what is the name of the agency? N/a  Has the agency set up a time to come to the patient's home? not applicable Were any new equipment or medical supplies ordered?  No What is the name of the medical supply agency? N/a Were you able to get the supplies/equipment? not applicable Do you have any questions related to the use of the equipment or supplies? No   Functional Questionnaire: (I = Independent and D = Dependent) ADLs: independent.   Follow up appointments reviewed:   PCP Hospital f/u appt confirmed? Yes  Scheduled to see Dr Delford Field, MD - 03/06/2022. Patient prefers a male PCP.  Specialist Hospital f/u appt confirmed?  None scheduled at this time    Are transportation arrangements needed? No  If their condition worsens, is the pt aware to call PCP or go to the Emergency Dept.? Yes Was the patient provided with contact information for the PCP's office or ED? Yes- the information was text to him as he requested.  Was to pt encouraged to  call back with questions or concerns? Paul [name] KYH:[id number] DOB: Jan 12, 1961 DOA: 02/18/2022   PCP: Paul Sessions, NP   Admit date: 02/18/2022  Discharge date: 02/20/2022   Admitted From: Home   Disposition:  Home     Recommendations for Outpatient Follow-up:    Follow up with PCP in 1-2 weeks   PCP Please obtain BMP/CBC, 2 view CXR in 1week,  (see Discharge instructions)    PCP Please follow up on the following pending results: Monitor secondary risk factors for CVA, noncompliant with medications.  Monitor alcohol use/abuse.  Outpatient GI follow-up for chronic diarrhea and chronic intermittent abdominal discomfort.   Home Health: Outpatient PT for vestibular rehab Equipment/Devices: Walker Consultations: Neuro Discharge Condition: Stable    CODE STATUS: Full    Diet Recommendation: Heart Healthy    Diet Order                  Diet - low sodium heart healthy             Diet Heart Room service appropriate? Yes; Fluid consistency: Thin  Diet effective now                            Chief Complaint  Patient presents with   Dizziness   Headache      Brief history of present illness from the day of  admission and additional interim summary     61 y.o. male with medical history significant of CVA (2021), HTN, BPH and alcohol use who presents with worsening dizziness/lightheadedness.  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  He was seen by neurology and underwent full stroke work-up.  Currently close to his baseline.                                                                  Hospital Course    Dizziness with incidental CVA (cerebral vascular accident) (HCC) and possible BPV  - History of CVA in 2021 on daily aspirin, now new ischemic infarct seen on MRI at the right periatrial matter with small evolving subacute small vessel infarct, CTA head and neck negative for large vessel occlusion, stroke likely incidental, dizziness  most likely due to BPV, full stroke work-up was done, currently back to baseline, neurology was on board, current recommendation is aspirin along with statin and Zetia lifelong, 4 weeks of Brilinta, LDL was above goal counseled to be compliant with home dose Lipitor which she was not taking, also Zetia added for better control, PCP to monitor secondary risk factors for CVA, case discussed with neurologist Dr. Pearlean Brownie and PT, outpatient vestibular rehab will be arranged by case management.  Currently close to his baseline will be discharged home with outpatient PCP and neurology follow-up.   Chronic diarrhea Month-long diarrhea worse with food.  Currently stable question if he is developing pancreatic insufficiency from alcohol use, recommend follow-up with GI will be arranged by PCP.   Alcohol use Reports 2-3 beers nightly. -Currently no signs of DTs, strictly counseled to quit alcohol.   HTN (hypertension) Appears to have been at goal in the past.  Allow for permissive hypertension, blood pressure medications adjusted as below, of note patient is noncompliant with his blood pressure medications PCP to monitor closely.   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  PCP to monitor A1c 6.3.   Dyslipidemia.  Noncompliant with Lipitor, counseled, resume home dose Lipitor, also Zetia added.     Discharge diagnosis       Principal Problem:   CVA (cerebral vascular accident) (HCC) Active Problems:   HTN (hypertension)   Alcohol use   Chronic diarrhea {History (Optional):23778}  ROS    Objective:     There were no vitals taken for this visit. {Vitals History (Optional):23777}  Physical Exam   No results found for any visits on 03/06/22.  {Labs (Optional):23779}  The ASCVD Risk score (Arnett DK, et al., 2019) failed to calculate for the following reasons:   The patient has a prior MI or stroke diagnosis    Assessment & Plan:   Problem List Items Addressed This Visit   None   No follow-ups on  file.    Paul Levans, MD

## 2022-03-06 NOTE — Patient Instructions (Signed)
Start tamsulosin 1 daily for urination  No changes in other medications [facility]  Brilinta will stop after current bottle runs out  Stop thiamine and folic acid when current bottle is run out  Stay on amlodipine and metoprolol stay on Zetia and atorvastatin  Avoid all alcohol  Follow a healthy diet as we described in the lifestyle medicine handout  Obtain a chest x-ray you will have to come in another day for that as they are closing the x-ray department on the first floor  Post Hospital labs will be obtained today and a urinalysis will be obtained  Return to Dr. Delford Field 2 months

## 2022-03-06 NOTE — Assessment & Plan Note (Signed)
Blood pressure today 142/82. Slightly elevated but permissible due to recent hospitalization. Continue currents medications.   The following Lifestyle Medicine recommendations according to [employer] [facility]) were discussed and offered to patient who agrees to start the journey:  A. Whole Foods, Plant-based plate comprising of fruits and vegetables, plant-based proteins, whole-grain carbohydrates was discussed in detail with the patient.   A list for source of those nutrients were also provided to the patient.  Patient will use only water or unsweetened tea for hydration. B.  The need to stay away from risky substances including alcohol, smoking; obtaining 7 to 9 hours of restorative sleep, at least 150 minutes of moderate intensity exercise weekly, the importance of healthy social connections,  and stress reduction techniques were discussed.

## 2022-03-06 NOTE — Assessment & Plan Note (Signed)
Formally was   socially drinking  2-3 beers a night. He has stopped since his CVA.  Counseled patient on the importance of abstaining from alcohol due to his medical conditions.

## 2022-03-06 NOTE — Assessment & Plan Note (Signed)
Exam today revealed no change from baseline after discharge. Patient's condition is stable. Continue amlodipine, atorvastatin, and ezetimibe. Will continue to monitor for neurological changes at follow up.

## 2022-03-06 NOTE — Progress Notes (Unsigned)
[place]  Office Visit  POST HOSPITAL   Subjective   Patient ID: Paul [name], male    DOB: 14-Jan-1961  Age: 61 y.o. MRN: [id number]  Chief Complaint  Patient presents with   Medication Refill   Hospitalization Follow-up   Establish Care   TRANSITION OF CARE VISIT FOR RECENT DC.   Rn TOC VISIT OCCURRED 02/21/22 AND REVIEWED  Patient desires to establish care at [facility]   Former Sylvester Harder PCP patient last seen 12/2020 Paul [name] is a 61 year old male with a history of cerebrovascular accident x2 (2021/2023), hypertension, benign prostatic hyperplasia, and chronic diarrhea presents to the office today for transfer of care following recent hospitalization. During his hospitalization, MRI revealed right periatrial matter with small evolving subacute vessel infarct. He was released from the hospital on June 5 after returning to baseline.  Today, Paul [name] is in good spirits after his hospitalization. He continues to struggle with chronic diarrhea and urinary frequency.  Otherwise, No acute concerns. Denies tobacco and substance abuse. Admits to consuming alcohol socially.  Basic Labs and Urinalysis with culture drawn today including: complete blood count, complete metabolic count, and Hepatitis C screening. Tetanus vaccine not given today due to office staff availability. Radiology closed at this time and patient instructed to follow up for chest x ray.  Diarrhea  This is a chronic problem. The current episode started more than 1 month ago. The problem has been unchanged. The stool consistency is described as Watery. Associated symptoms include abdominal pain. Pertinent negatives include no vomiting. Exacerbated by: spicy foods. Risk factors include recent hospitalization. His past medical history is significant for malabsorption.  Benign Prostatic Hypertrophy This is a chronic problem. The current episode started more than 1 year ago. The problem has been gradually worsening since onset.  Irritative symptoms include frequency and urgency. Obstructive symptoms include dribbling and a weak stream. Associated symptoms include dysuria and hesitancy. Pertinent negatives include no hematuria, nausea or vomiting. Past treatments include nothing.   Recent Hospital Course Below:  Dizziness with incidental CVA (cerebral vascular accident)  ([place]) and possible BPV  - History of CVA in 2021 on daily aspirin, now new ischemic infarct seen on MRI at the right periatrial matter with small evolving subacute small vessel infarct, CTA head and neck negative for large vessel occlusion, stroke likely incidental, 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 Leonie [name] and PT, outpatient vestibular rehab will be arranged by case management.  Currently close to his baseline will be discharged home with outpatient PCP and neurology follow-up.   Chronic diarrhea Month-long diarrhea worse with food.  Currently stable question if he is developing pancreatic insufficiency from alcohol use, recommend follow-up with GI will be arranged by PCP.   Alcohol use Reports 2-3 beers nightly. -Currently no signs of DTs, strictly counseled to quit alcohol.   HTN (hypertension) Appears to have been at goal in the past.  Allow for permissive hypertension, blood pressure medications adjusted as below, of note patient is noncompliant with his blood pressure medications PCP to monitor closely.   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  PCP to monitor A1c 6.3.  Dyslipidemia.  Noncompliant with Lipitor, counseled, resume home dose Lipitor, also Zetia added.  Patient Active  Problem List   Diagnosis Date Noted   Hyperlipidemia 03/07/2022   CVA (cerebral vascular accident) ([place]) 02/18/2022   Chronic diarrhea 02/18/2022   Tympanic membrane perforation, left 10/06/2020   HTN (hypertension) 08/25/2020   BPH (benign prostatic hyperplasia) 08/25/2020   History of tobacco use 08/25/2020   Alcohol use 08/25/2020   Past Medical History:  Diagnosis Date   Acute CVA (cerebrovascular accident) ([place]) 08/25/2020   Hypertension    Stroke ([place])    Urinary frequency    Past Surgical History:  Procedure Laterality Date   COLONOSCOPY WITH PROPOFOL N/A 01/31/2016   Procedure: COLONOSCOPY WITH PROPOFOL;  Surgeon: Garlan Fair, MD;  Location: [facility];  Service: Endoscopy;  Laterality: N/A;   NO PAST SURGERIES     Social History   Tobacco Use   Smoking status: Former    Packs/day: 0.50    Years: 30.00    Total pack years: 15.00    Types: Cigarettes    Quit date: 08/24/2020    Years since quitting: 1.5   Smokeless tobacco: Never  Substance Use Topics   Alcohol use: Yes    Comment: 1-2 beers most days   Drug use: No   Social History   Socioeconomic History   Marital status: Single    Spouse name: Not on file   Number of children: Not on file   Years of education: Not on file   Highest education level: Not on file  Occupational History   Not on file  Tobacco Use   Smoking status: Former    Packs/day: 0.50    Years: 30.00    Total pack years: 15.00    Types: Cigarettes    Quit date: 08/24/2020    Years since quitting: 1.5   Smokeless tobacco: Never  Substance and Sexual Activity   Alcohol use: Yes    Comment: 1-2 beers most days   Drug use: No   Sexual activity: Not on file  Other Topics Concern   Not on file  Social History Narrative   Not on file   Social Determinants of Health   Financial Resource Strain: Not on file  Food Insecurity: Not on file  Transportation Needs: Not on file  Physical Activity: Not on file  Stress: Not on  file  Social Connections: Not on file  Intimate Partner Violence: Not on file   No family status information on file.   History reviewed. No pertinent family history. No Known Allergies  Review of Systems  Constitutional: Negative.   HENT: Negative.    Eyes: Negative.   Respiratory: Negative.    Cardiovascular: Negative.   Gastrointestinal:  Positive for abdominal pain and diarrhea. Negative for nausea and vomiting.  Genitourinary:  Positive for dysuria, frequency, hesitancy and urgency. Negative for hematuria.  Musculoskeletal: Negative.   Skin: Negative.   Neurological: Negative.   Endo/Heme/Allergies: Negative.   Psychiatric/Behavioral: Negative.        Objective:     BP (!) 142/82 (BP Location: Right Arm)   Pulse 73   Wt 145 lb (65.8 kg)   SpO2 95%   BMI 26.52 kg/m  BP Readings from Last 3 Encounters:  03/06/22 (!) 142/82  02/20/22 (!) 161/98  01/03/21 (!) 168/104   Wt Readings from Last 3 Encounters:  03/06/22 145 lb (65.8 kg)  02/19/22 143 lb 4.8 oz (65 kg)  01/03/21 136 lb 9.6 oz (62  kg)      Physical Exam Constitutional:      Appearance: Normal appearance.  HENT:     Right Ear: Tympanic membrane, ear canal and external ear normal.     Left Ear: Ear canal and external ear normal.     Ears:     Comments: Chronic left TM perforation, no acute otitis Eyes:     Extraocular Movements: Extraocular movements intact.     Conjunctiva/sclera: Conjunctivae normal.     Pupils: Pupils are equal, round, and reactive to light.     Comments: Left tympanic membrane perforation  Cardiovascular:     Rate and Rhythm: Normal rate and regular rhythm.     Pulses: Normal pulses.     Heart sounds: Normal heart sounds.  Pulmonary:     Effort: Pulmonary effort is normal.     Breath sounds: Normal breath sounds.  Abdominal:     General: Abdomen is flat. Bowel sounds are increased.     Palpations: Abdomen is soft.     Tenderness: There is abdominal tenderness in the  suprapubic area.  Genitourinary:    Prostate: Enlarged.  Skin:    General: Skin is warm and dry.  Neurological:     Mental Status: He is alert.  Psychiatric:        Mood and Affect: Mood normal.        Behavior: Behavior normal.    2017 COLONOSCOPY PER DR Earle Gell: Impression:               - The entire examined colon is normal.                           - No specimens collected.   Results for orders placed or performed in visit on 03/06/22  HCV Ab w Reflex to Quant PCR  Result Value Ref Range   HCV Ab Non Reactive Non Reactive  Comprehensive metabolic panel  Result Value Ref Range   Glucose 97 70 - 99 mg/dL   BUN 14 8 - 27 mg/dL   Creatinine, Ser 0.80 0.76 - 1.27 mg/dL   eGFR 99 >59 mL/min/1.73   BUN/Creatinine Ratio 18 10 - 24   Sodium 141 134 - 144 mmol/L   Potassium 3.8 3.5 - 5.2 mmol/L   Chloride 102 96 - 106 mmol/L   CO2 23 20 - 29 mmol/L   Calcium 8.6 8.6 - 10.2 mg/dL   Total Protein 8.3 6.0 - 8.5 g/dL   Albumin 4.6 3.8 - 4.8 g/dL   Globulin, Total 3.7 1.5 - 4.5 g/dL   Albumin/Globulin Ratio 1.2 1.2 - 2.2   Bilirubin Total 0.5 0.0 - 1.2 mg/dL   Alkaline Phosphatase 88 44 - 121 IU/L   AST 26 0 - 40 IU/L   ALT 43 0 - 44 IU/L  CBC with Differential/Platelet  Result Value Ref Range   WBC 7.2 3.4 - 10.8 x10E3/uL   RBC 6.09 (H) 4.14 - 5.80 x10E6/uL   Hemoglobin 14.4 13.0 - 17.7 g/dL   Hematocrit 45.5 37.5 - 51.0 %   MCV 75 (L) 79 - 97 fL   MCH 23.6 (L) 26.6 - 33.0 pg   MCHC 31.6 31.5 - 35.7 g/dL   RDW 15.1 11.6 - 15.4 %   Platelets 240 150 - 450 x10E3/uL   Neutrophils 68 Not Estab. %   Lymphs 23 Not Estab. %   Monocytes 7 Not Estab. %   Eos 2 Not  Estab. %   Basos 0 Not Estab. %   Neutrophils Absolute 4.8 1.4 - 7.0 x10E3/uL   Lymphocytes Absolute 1.7 0.7 - 3.1 x10E3/uL   Monocytes Absolute 0.5 0.1 - 0.9 x10E3/uL   EOS (ABSOLUTE) 0.2 0.0 - 0.4 x10E3/uL   Basophils Absolute 0.0 0.0 - 0.2 x10E3/uL   Immature Granulocytes 0 Not Estab. %   Immature  Grans (Abs) 0.0 0.0 - 0.1 x10E3/uL  Interpretation:  Result Value Ref Range   HCV Interp 1: Comment     Last CBC Lab Results  Component Value Date   WBC 7.2 03/06/2022   HGB 14.4 03/06/2022   HCT 45.5 03/06/2022   MCV 75 (L) 03/06/2022   MCH 23.6 (L) 03/06/2022   RDW 15.1 03/06/2022   PLT 240 35/57/3220   Last metabolic panel Lab Results  Component Value Date   GLUCOSE 97 03/06/2022   NA 141 03/06/2022   K 3.8 03/06/2022   CL 102 03/06/2022   CO2 23 03/06/2022   BUN 14 03/06/2022   CREATININE 0.80 03/06/2022   GFRNONAA >60 02/18/2022   CALCIUM 8.6 03/06/2022   PHOS 3.1 08/25/2020   PROT 8.3 03/06/2022   ALBUMIN 4.6 03/06/2022   LABGLOB 3.7 03/06/2022   AGRATIO 1.2 03/06/2022   BILITOT 0.5 03/06/2022   ALKPHOS 88 03/06/2022   AST 26 03/06/2022   ALT 43 03/06/2022   ANIONGAP 8 02/18/2022   Last lipids Lab Results  Component Value Date   CHOL 183 02/19/2022   HDL 51 02/19/2022   LDLCALC 117 (H) 02/19/2022   TRIG 77 02/19/2022   CHOLHDL 3.6 02/19/2022   Last hemoglobin A1c Lab Results  Component Value Date   HGBA1C 6.3 (H) 02/18/2022   Last thyroid functions Lab Results  Component Value Date   TSH 1.407 02/19/2022   Last vitamin D No results found for: "25OHVITD2", "25OHVITD3", "VD25OH" Last vitamin B12 and Folate No results found for: "VITAMINB12", "FOLATE"  The ASCVD Risk score (Arnett DK, et al., 2019) failed to calculate for the following reasons:   The patient has a prior MI or stroke diagnosis    Assessment & Plan:   Problem List Items Addressed This Visit       Cardiovascular and Mediastinum   HTN (hypertension)    Blood pressure today 142/82. Slightly elevated but permissible due to recent hospitalization. Continue currents medications.   The following Lifestyle Medicine recommendations according to [facility]) were discussed and offered to patient who agrees to start the journey:  A. Whole Foods,  Plant-based plate comprising of fruits and vegetables, plant-based proteins, whole-grain carbohydrates was discussed in detail with the patient.   A list for source of those nutrients were also provided to the patient.  Patient will use only water or unsweetened tea for hydration. B.  The need to stay away from risky substances including alcohol, smoking; obtaining 7 to 9 hours of restorative sleep, at least 150 minutes of moderate intensity exercise weekly, the importance of healthy social connections,  and stress reduction techniques were discussed.       Relevant Medications   amLODipine (NORVASC) 10 MG tablet   atorvastatin (LIPITOR) 80 MG tablet   ezetimibe (ZETIA) 10 MG tablet   Other Relevant Orders   Comprehensive metabolic panel (Completed)   CBC with Differential/Platelet (Completed)   CVA (cerebral vascular accident) [facility])    Exam today revealed no change from baseline after discharge. Patient's condition is stable. Continue amlodipine, atorvastatin, and ezetimibe. Will  continue to monitor for neurological changes at follow up.      Relevant Medications   amLODipine (NORVASC) 10 MG tablet   atorvastatin (LIPITOR) 80 MG tablet   ezetimibe (ZETIA) 10 MG tablet   Other Relevant Orders   Comprehensive metabolic panel (Completed)     Digestive   Chronic diarrhea    Patient continues to complain of diarrhea occurring after meals. Specifically exacerbated with spicy foods. Last colonoscopy in 2017. Will refer to GI for repeat colonosocpy if problem has not resolved on follow up.        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 Patient continues to struggle with urinary frequency, hesitancy, and dysuria. Begin Tamsulosin for relief of symptoms. Will reevaluate at follow up.  Urinalysis with  culture obtained today.      Relevant Medications   tamsulosin (FLOMAX) 0.4 MG CAPS capsule     Other   History of tobacco use    Pt does not currently have any tobacco use       Alcohol use    Formally was   socially drinking  2-3 beers a night. He has stopped since his CVA.  Counseled patient on the importance of abstaining from alcohol due to his medical conditions.      Hyperlipidemia    Pt to continue zetia and lipitor   Goal LDL 70  Recheck Lipid panel in future   LDL 117 off medication 02/19/22  The following Lifestyle Medicine recommendations according to [facility]) were discussed and offered to patient who agrees to start the journey:  A. Whole Foods, Plant-based plate comprising of fruits and vegetables, plant-based proteins, whole-grain carbohydrates was discussed in detail with the patient.   A list for source of those nutrients were also provided to the patient.  Patient will use only water or unsweetened tea for hydration. B.  The need to stay away from risky substances including alcohol, smoking; obtaining 7 to 9 hours of restorative sleep, at least 150 minutes of moderate intensity exercise weekly, the importance of healthy social connections,  and stress reduction techniques were discussed.       Relevant Medications   amLODipine (NORVASC) 10 MG tablet   atorvastatin (LIPITOR) 80 MG tablet   ezetimibe (ZETIA) 10 MG tablet   RESOLVED: History of CVA in adulthood   Relevant Medications   atorvastatin (LIPITOR) 80 MG tablet   Other Visit Diagnoses     Need for hepatitis C screening test    -  Primary   Relevant Orders   HCV Ab w Reflex to Quant PCR (Completed)   Lung infiltrate       Relevant Orders   DG Chest 2 View   Urine frequency       Relevant Orders   Urine Culture   Urinalysis   Essential hypertension       Relevant Medications   amLODipine (NORVASC) 10 MG tablet   atorvastatin (LIPITOR) 80 MG tablet    ezetimibe (ZETIA) 10 MG tablet     48 min spent on hx physical, review of old records and dc summary, pt education, high complex in decision making  Pt needs Tdap will receive next OV Return in about 2 months (around 05/06/2022).    Saralyn Pilar  Joya Gaskins, MD

## 2022-03-06 NOTE — Assessment & Plan Note (Signed)
Patient continues to complain of diarrhea occurring after meals. Specifically exacerbated with spicy foods. Last colonoscopy in 2017. Will refer to GI for repeat colonosocpy if problem has not resolved on follow up.

## 2022-03-06 NOTE — Assessment & Plan Note (Addendum)
Physical Exam revealed enlarged prostate. Patient continues to struggle with urinary frequency, hesitancy, and dysuria. Begin Tamsulosin for relief of symptoms. Will reevaluate at follow up.  Urinalysis with culture obtained today.

## 2022-03-07 ENCOUNTER — Ambulatory Visit
Admission: RE | Admit: 2022-03-07 | Discharge: 2022-03-07 | Disposition: A | Discharge status: Home / Self Care | Payer: PRIVATE HEALTH INSURANCE | Source: Ambulatory Visit | Attending: Critical Care Medicine | Admitting: Critical Care Medicine

## 2022-03-07 ENCOUNTER — Telehealth: Payer: Self-pay | Admitting: Critical Care Medicine

## 2022-03-07 ENCOUNTER — Encounter: Payer: Self-pay | Admitting: Critical Care Medicine

## 2022-03-07 DIAGNOSIS — E785 Hyperlipidemia, unspecified: Secondary | ICD-10-CM | POA: Insufficient documentation

## 2022-03-07 DIAGNOSIS — R918 Other nonspecific abnormal finding of lung field: Secondary | ICD-10-CM

## 2022-03-07 LAB — CBC WITH DIFFERENTIAL/PLATELET
Basophils Absolute: 0 10*3/uL (ref 0.0–0.2)
Basos: 0 %
EOS (ABSOLUTE): 0.2 10*3/uL (ref 0.0–0.4)
Eos: 2 %
Hematocrit: 45.5 % (ref 37.5–51.0)
Hemoglobin: 14.4 g/dL (ref 13.0–17.7)
Immature Grans (Abs): 0 10*3/uL (ref 0.0–0.1)
Immature Granulocytes: 0 %
Lymphocytes Absolute: 1.7 10*3/uL (ref 0.7–3.1)
Lymphs: 23 %
MCH: 23.6 pg — ABNORMAL LOW (ref 26.6–33.0)
MCHC: 31.6 g/dL (ref 31.5–35.7)
MCV: 75 fL — ABNORMAL LOW (ref 79–97)
Monocytes Absolute: 0.5 10*3/uL (ref 0.1–0.9)
Monocytes: 7 %
Neutrophils Absolute: 4.8 10*3/uL (ref 1.4–7.0)
Neutrophils: 68 %
Platelets: 240 10*3/uL (ref 150–450)
RBC: 6.09 x10E6/uL — ABNORMAL HIGH (ref 4.14–5.80)
RDW: 15.1 % (ref 11.6–15.4)
WBC: 7.2 10*3/uL (ref 3.4–10.8)

## 2022-03-07 LAB — COMPREHENSIVE METABOLIC PANEL
ALT: 43 IU/L (ref 0–44)
AST: 26 IU/L (ref 0–40)
Albumin/Globulin Ratio: 1.2 (ref 1.2–2.2)
Albumin: 4.6 g/dL (ref 3.8–4.8)
Alkaline Phosphatase: 88 IU/L (ref 44–121)
BUN/Creatinine Ratio: 18 (ref 10–24)
BUN: 14 mg/dL (ref 8–27)
Bilirubin Total: 0.5 mg/dL (ref 0.0–1.2)
CO2: 23 mmol/L (ref 20–29)
Calcium: 8.6 mg/dL (ref 8.6–10.2)
Chloride: 102 mmol/L (ref 96–106)
Creatinine, Ser: 0.8 mg/dL (ref 0.76–1.27)
Globulin, Total: 3.7 g/dL (ref 1.5–4.5)
Glucose: 97 mg/dL (ref 70–99)
Potassium: 3.8 mmol/L (ref 3.5–5.2)
Sodium: 141 mmol/L (ref 134–144)
Total Protein: 8.3 g/dL (ref 6.0–8.5)
eGFR: 99 mL/min/{1.73_m2} (ref >59)

## 2022-03-07 LAB — HCV AB W REFLEX TO QUANT PCR: HCV Ab: NONREACTIVE

## 2022-03-07 LAB — HCV INTERPRETATION

## 2022-03-07 NOTE — Progress Notes (Signed)
Let patient know blood counts are normal hepatitis C screen is negative kidneys are normal awaiting chest x-ray and urine studies

## 2022-03-07 NOTE — Telephone Encounter (Signed)
Paperwork in box, will call and fax once done

## 2022-03-07 NOTE — Assessment & Plan Note (Signed)
Pt does not currently have any tobacco use

## 2022-03-07 NOTE — Assessment & Plan Note (Signed)
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

## 2022-03-07 NOTE — Assessment & Plan Note (Addendum)
Pt to continue zetia and lipitor   Goal LDL 70  Recheck Lipid panel in future   LDL 117 off medication 02/19/22  The following Lifestyle Medicine recommendations according to American College of Lifestyle Medicine [facility]) were discussed and offered to patient who agrees to start the journey:  A. Whole Foods, Plant-based plate comprising of fruits and vegetables, plant-based proteins, whole-grain carbohydrates was discussed in detail with the patient.   A list for source of those nutrients were also provided to the patient.  Patient will use only water or unsweetened tea for hydration. B.  The need to stay away from risky substances including alcohol, smoking; obtaining 7 to 9 hours of restorative sleep, at least 150 minutes of moderate intensity exercise weekly, the importance of healthy social connections,  and stress reduction techniques were discussed.

## 2022-03-07 NOTE — Telephone Encounter (Signed)
Pt dropped of paperwork to filled out by pcp, pt also would like the paperwork faxed when its complete to [phone] and to also give him a call when its complete at [phone]

## 2022-03-08 ENCOUNTER — Telehealth: Payer: Self-pay

## 2022-03-08 LAB — URINALYSIS
Bilirubin, UA: NEGATIVE
Glucose, UA: NEGATIVE
Ketones, UA: NEGATIVE
Leukocytes,UA: NEGATIVE
Nitrite, UA: NEGATIVE
RBC, UA: NEGATIVE
Specific Gravity, UA: 1.01 (ref 1.005–1.030)
Urobilinogen, Ur: 0.2 mg/dL (ref 0.2–1.0)
pH, UA: 6.5 (ref 5.0–7.5)

## 2022-03-08 NOTE — Telephone Encounter (Signed)
Pt was called and no vm was left due to mailbox being full. Information has been sent to nurse pool.   Paperwork faxed as well

## 2022-03-08 NOTE — Telephone Encounter (Signed)
-----   Message from Storm Frisk, MD sent at 03/07/2022  7:54 PM EDT ----- Let patient know blood counts are normal hepatitis C screen is negative kidneys are normal awaiting chest x-ray and urine studies

## 2022-03-08 NOTE — Telephone Encounter (Signed)
-----   Message from Storm Frisk, MD sent at 03/08/2022  5:39 AM EDT ----- Let pt know xray was normal

## 2022-03-08 NOTE — Progress Notes (Signed)
Let pt know xray was normal

## 2022-03-08 NOTE — Telephone Encounter (Signed)
Pt was called and no vm was left due to mailbox being full. Information has been sent to nurse pool.  

## 2022-03-09 ENCOUNTER — Telehealth: Payer: Self-pay

## 2022-03-09 LAB — URINE CULTURE

## 2022-03-09 NOTE — Telephone Encounter (Signed)
-----   Message from Storm Frisk, MD sent at 03/09/2022  9:28 AM EDT ----- Let pt know urine culture is negative no infection

## 2022-03-09 NOTE — Telephone Encounter (Signed)
Pt was called and is aware of results, DOB was confirmed.  ?

## 2022-03-10 ENCOUNTER — Telehealth (HOSPITAL_COMMUNITY): Payer: Self-pay

## 2022-03-14 ENCOUNTER — Telehealth (HOSPITAL_COMMUNITY): Payer: Self-pay

## 2022-03-15 ENCOUNTER — Telehealth (HOSPITAL_COMMUNITY): Payer: Self-pay

## 2022-03-15 NOTE — Telephone Encounter (Signed)
Transitions of Care Pharmacy   Call attempted for a pharmacy transitions of care follow-up. Unable to leave voicemail.  Call attempt #3.   

## 2022-03-24 ENCOUNTER — Telehealth: Payer: Self-pay | Admitting: Emergency Medicine

## 2022-03-24 NOTE — Telephone Encounter (Signed)
short term disability paperwork follow up

## 2022-03-27 NOTE — Telephone Encounter (Signed)
Pt called in to follow up on paperwork. Pt says that he was told to have it returned back in by Monday 7/17 if possible.    Please advise further.

## 2022-03-27 NOTE — Telephone Encounter (Signed)
Paperwork was faxed 6/21 pt request, he is aware of this and will pick up the paperwork tom

## 2022-04-04 NOTE — Telephone Encounter (Signed)
Pt has now called in and states that he needs a note filled out for him to provide for his insurance co that he is not able to work from 6/19 to August 23 when he states is his appt. Questioned pt if there was not a specific form that insurance was requesting to be filled out but states he just needs a note from the dr just like a wrk note saying he can not work. FU with questions to [phone]

## 2022-04-06 NOTE — Telephone Encounter (Signed)
He needs to send [place] an FMLA form from his employer  that is the legal manner to request short term leave

## 2022-04-06 NOTE — Telephone Encounter (Signed)
Called pt and he said he needs a note outlining his condition stating reason why he is on leave

## 2022-04-11 NOTE — Telephone Encounter (Signed)
Pt was told to get the FMLA form from Employer and he agreed to do so and will fu back up when he gets form

## 2022-04-12 NOTE — Telephone Encounter (Signed)
noted 

## 2022-04-12 NOTE — Telephone Encounter (Signed)
Noted  

## 2022-05-10 ENCOUNTER — Encounter: Payer: Self-pay | Admitting: Critical Care Medicine

## 2022-05-10 ENCOUNTER — Ambulatory Visit: Payer: PRIVATE HEALTH INSURANCE | Attending: Critical Care Medicine | Admitting: Critical Care Medicine

## 2022-05-10 VITALS — BP 165/91 | HR 59 | Temp 98.1°F | Ht 64.0 in | Wt 149.6 lb

## 2022-05-10 DIAGNOSIS — I10 Essential (primary) hypertension: Secondary | ICD-10-CM

## 2022-05-10 DIAGNOSIS — K529 Noninfective gastroenteritis and colitis, unspecified: Secondary | ICD-10-CM

## 2022-05-10 DIAGNOSIS — Z789 Other specified health status: Secondary | ICD-10-CM

## 2022-05-10 DIAGNOSIS — N401 Enlarged prostate with lower urinary tract symptoms: Secondary | ICD-10-CM

## 2022-05-10 DIAGNOSIS — I639 Cerebral infarction, unspecified: Secondary | ICD-10-CM | POA: Diagnosis not present

## 2022-05-10 DIAGNOSIS — E782 Mixed hyperlipidemia: Secondary | ICD-10-CM

## 2022-05-10 DIAGNOSIS — F1090 Alcohol use, unspecified, uncomplicated: Secondary | ICD-10-CM

## 2022-05-10 MED ORDER — THIAMINE HCL 100 MG PO TABS: 100.0000 mg | ORAL_TABLET | Freq: Every day | ORAL | 1 refills | Status: DC

## 2022-05-10 MED ORDER — VALSARTAN-HYDROCHLOROTHIAZIDE 160-25 MG PO TABS: 1.0000 | ORAL_TABLET | Freq: Every day | ORAL | 3 refills | Status: DC

## 2022-05-10 MED ORDER — BLOOD PRESSURE KIT DEVI: 0 refills | Status: AC

## 2022-05-10 NOTE — Progress Notes (Signed)
TRANSITION OF CARE  Office Visit  [facility]   Subjective   Patient ID: Paul [name], male    DOB: December 31, 1960  Age: 61 y.o. MRN: [id number]  Chief Complaint  Patient presents with   Hypertension   03/06/22 TRANSITION OF CARE VISIT FOR RECENT DC.   Rn TOC VISIT OCCURRED 02/21/22 AND REVIEWED  Patient desires to establish care at [facility]   Former Paul [name] PCP patient last seen 12/2020 Paul [name] is a 61 year old male with a history of cerebrovascular accident x2 (2021/2023), hypertension, benign prostatic hyperplasia, and chronic diarrhea presents to the office today for transfer of care following recent hospitalization. During his hospitalization, MRI revealed right periatrial matter with small evolving subacute vessel infarct. He was released from the hospital on June 5 after returning to baseline.  Today, Paul [name] is in good spirits after his hospitalization. He continues to struggle with chronic diarrhea and urinary frequency.  Otherwise, No acute concerns. Denies tobacco and substance abuse. Admits to consuming alcohol socially.  Basic Labs and Urinalysis with culture drawn today including: complete blood count, complete metabolic count, and Hepatitis C screening. Tetanus vaccine not given today due to office staff availability. Radiology closed at this time and patient instructed to follow up for chest x ray.  Diarrhea  This is a chronic problem. The current episode started more than 1 month ago. The problem has been unchanged. The stool consistency is described as Watery. Pertinent negatives include no abdominal pain, chills, coughing, fever, headaches, myalgias, vomiting or weight loss. Exacerbated by: spicy foods. Risk factors include recent hospitalization. His past medical history is significant for malabsorption.  Benign Prostatic Hypertrophy This is a chronic problem. The current episode started more than 1 year ago. The problem has been gradually worsening since onset. Irritative  symptoms do not include frequency or urgency. Obstructive symptoms include dribbling and a weak stream. Associated symptoms include hesitancy. Pertinent negatives include no chills, dysuria, hematuria, nausea or vomiting. Past treatments include nothing.   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 Pearlean Brownie and PT, outpatient vestibular rehab will be arranged by case management.  Currently close to his baseline will be discharged home with outpatient PCP and neurology follow-up.   Chronic diarrhea Month-long diarrhea worse with food.  Currently stable question if he is developing pancreatic insufficiency from alcohol use, recommend follow-up with GI will be arranged by PCP.   Alcohol use Reports 2-3 beers nightly. -Currently no signs of DTs, strictly counseled to quit alcohol.   HTN (hypertension) Appears to have been at goal in the past.  Allow for permissive hypertension, blood pressure medications adjusted as below, of note patient is noncompliant with his blood pressure medications PCP to monitor closely.   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  PCP to monitor A1c 6.3.  Dyslipidemia.  Noncompliant with Lipitor, counseled, resume home dose Lipitor, also Zetia added.  05/10/22 Patient  returns in follow-up post stroke on arrival blood pressure elevated 165/91 despite taking amlodipine.  Patient tends to only eat dinner consists of mainly white rice and eggplant.  He skips breakfast and lunch.  He is no longer drinking alcohol.  Note he has been out of work since June 3 of this summer.  He is able to return to work on August 28.  He had paperwork regarding his FMLA need to be filled out by his company and we need to address this.  Patient is yet to have neurology follow-up and is yet to receive outpatient physical therapy for his stroke.  The hospital was to set this up but it did not occur.  Patient has occasional dizziness occasional headache but balance is improved which was findings when he had his stroke.  Patient does maintain fish oil Zetia and high-dose atorvastatin for cholesterol management.  Patient is maintaining thiamine and folic acid as well.  Patient is on tamsulosin low-dose and is this results in improved urine output. Gastrointestinal symptoms have resolved Patient Active Problem List   Diagnosis Date Noted   Hyperlipidemia 03/07/2022   CVA (cerebral vascular accident) ([place]) 02/18/2022   Tympanic membrane perforation, left 10/06/2020   HTN (hypertension) 08/25/2020   BPH (benign prostatic hyperplasia) 08/25/2020   History of tobacco use 08/25/2020   Alcohol use 08/25/2020   Past Medical History:  Diagnosis Date   Acute CVA (cerebrovascular accident) ([place]) 08/25/2020   Hypertension    Stroke ([place])    Urinary frequency    Past Surgical History:  Procedure Laterality Date   COLONOSCOPY WITH PROPOFOL N/A 01/31/2016   Procedure: COLONOSCOPY WITH PROPOFOL;  Surgeon: Garlan Fair, MD;  Location: [facility];  Service: Endoscopy;  Laterality: N/A;   NO PAST SURGERIES     Social History   Tobacco Use   Smoking status: Former    Packs/day: 0.50    Years: 30.00    Total pack years: 15.00    Types: Cigarettes    Quit date: 08/24/2020    Years since  quitting: 1.7   Smokeless tobacco: Never  Substance Use Topics   Alcohol use: Yes    Comment: 1-2 beers most days   Drug use: No   Social History   Socioeconomic History   Marital status: Single    Spouse name: Not on file   Number of children: Not on file   Years of education: Not on file   Highest education level: Not on file  Occupational History   Not on file  Tobacco Use   Smoking status: Former    Packs/day: 0.50    Years: 30.00    Total pack years: 15.00    Types: Cigarettes    Quit date: 08/24/2020    Years since quitting: 1.7   Smokeless tobacco: Never  Substance and Sexual Activity   Alcohol use: Yes    Comment: 1-2 beers most days   Drug use: No   Sexual activity: Not on file  Other Topics Concern   Not on file  Social History Narrative   Not on file   Social Determinants of Health   Financial Resource Strain: Not on file  Food Insecurity: Not on file  Transportation Needs: Not on file  Physical Activity: Not on file  Stress: Not on file  Social Connections: Not on file  Intimate Partner Violence: Not on file  No family status information on file.   History reviewed. No pertinent family history. No Known Allergies  Review of Systems  Constitutional: Negative.  Negative for chills, diaphoresis, fever, malaise/fatigue and weight loss.  HENT: Negative.  Negative for congestion, hearing loss, nosebleeds, sore throat and tinnitus.   Eyes: Negative.  Negative for blurred vision, photophobia and redness.  Respiratory: Negative.  Negative for cough, hemoptysis, sputum production, shortness of breath, wheezing and stridor.   Cardiovascular: Negative.  Negative for chest pain, palpitations, orthopnea, claudication, leg swelling and PND.  Gastrointestinal:  Negative for abdominal pain, blood in stool, constipation, diarrhea, heartburn, nausea and vomiting.  Genitourinary:  Positive for hesitancy. Negative for dysuria, flank pain, frequency, hematuria and  urgency.  Musculoskeletal: Negative.  Negative for back pain, falls, joint pain, myalgias and neck pain.  Skin: Negative.  Negative for itching and rash.  Neurological: Negative.  Negative for dizziness, tingling, tremors, sensory change, speech change, focal weakness, seizures, loss of consciousness, weakness and headaches.  Endo/Heme/Allergies: Negative.  Negative for environmental allergies and polydipsia. Does not bruise/bleed easily.  Psychiatric/Behavioral: Negative.  Negative for depression, memory loss, substance abuse and suicidal ideas. The patient is not nervous/anxious and does not have insomnia.       Objective:     BP (!) 165/91   Pulse (!) 59   Temp 98.1 F (36.7 C) (Oral)   Ht 5\' 4"  (1.626 m)   Wt 149 lb 9.6 oz (67.9 kg)   SpO2 99%   BMI 25.68 kg/m  BP Readings from Last 3 Encounters:  05/10/22 (!) 165/91  03/06/22 (!) 142/82  02/20/22 (!) 161/98   Wt Readings from Last 3 Encounters:  05/10/22 149 lb 9.6 oz (67.9 kg)  03/06/22 145 lb (65.8 kg)  02/19/22 143 lb 4.8 oz (65 kg)      Physical Exam Constitutional:      Appearance: Normal appearance.  HENT:     Right Ear: Tympanic membrane, ear canal and external ear normal.     Left Ear: Ear canal and external ear normal.  Eyes:     Extraocular Movements: Extraocular movements intact.     Conjunctiva/sclera: Conjunctivae normal.     Pupils: Pupils are equal, round, and reactive to light.     Comments: Left tympanic membrane perforation  Cardiovascular:     Rate and Rhythm: Normal rate and regular rhythm.     Pulses: Normal pulses.     Heart sounds: Normal heart sounds.  Pulmonary:     Effort: Pulmonary effort is normal.     Breath sounds: Normal breath sounds.  Abdominal:     General: Abdomen is flat. Bowel sounds are increased.     Palpations: Abdomen is soft.     Tenderness: There is no abdominal tenderness.  Genitourinary:    Prostate: Enlarged.  Skin:    General: Skin is warm and dry.   Neurological:     Mental Status: He is alert.  Psychiatric:        Mood and Affect: Mood normal.        Behavior: Behavior normal.    2017 COLONOSCOPY PER DR Earle Gell: Impression:               - The entire examined colon is normal.                           - No specimens collected.   No results found for any visits on 05/10/22.  Last CBC Lab Results  Component Value Date   WBC 7.2 03/06/2022   HGB 14.4 03/06/2022   HCT 45.5 03/06/2022   MCV 75 (L) 03/06/2022   MCH 23.6 (L) 03/06/2022   RDW 15.1 03/06/2022   PLT 240 03/06/2022   Last metabolic panel Lab Results  Component Value Date   GLUCOSE 97 03/06/2022   NA 141 03/06/2022   K 3.8 03/06/2022   CL 102 03/06/2022   CO2 23 03/06/2022   BUN 14 03/06/2022   CREATININE 0.80 03/06/2022   GFRNONAA >60 02/18/2022   CALCIUM 8.6 03/06/2022   PHOS 3.1 08/25/2020   PROT 8.3 03/06/2022   ALBUMIN 4.6 03/06/2022   LABGLOB 3.7 03/06/2022   AGRATIO 1.2 03/06/2022   BILITOT 0.5 03/06/2022   ALKPHOS 88 03/06/2022   AST 26 03/06/2022   ALT 43 03/06/2022   ANIONGAP 8 02/18/2022   Last lipids Lab Results  Component Value Date   CHOL 183 02/19/2022   HDL 51 02/19/2022   LDLCALC 117 (H) 02/19/2022   TRIG 77 02/19/2022   CHOLHDL 3.6 02/19/2022   Last hemoglobin A1c Lab Results  Component Value Date   HGBA1C 6.3 (H) 02/18/2022   Last thyroid functions Lab Results  Component Value Date   TSH 1.407 02/19/2022   Last vitamin D No results found for: "25OHVITD2", "25OHVITD3", "VD25OH" Last vitamin B12 and Folate No results found for: "VITAMINB12", "FOLATE"  The ASCVD Risk score (Arnett DK, et al., 2019) failed to calculate for the following reasons:   The patient has a prior MI or stroke diagnosis    Assessment & Plan:   Problem List Items Addressed This Visit       Cardiovascular and Mediastinum   HTN (hypertension)    Hypertension not well controlled we will add valsartan HCT and continue amlodipine  patient returns short-term for clinical pharmacy      Relevant Medications   valsartan-hydrochlorothiazide (DIOVAN-HCT) 160-25 MG tablet   CVA (cerebral vascular accident) (HCC) - Primary    Patient cleared to return to work with minimal restrictions August 28 paperwork was filled out will be processed sent in along with chart notes  Patient needs neurology consultation and outpatient physical therapy he may need to miss some work to go to physical therapy      Relevant Medications   valsartan-hydrochlorothiazide (DIOVAN-HCT) 160-25 MG tablet   Other Relevant Orders   Ambulatory referral to Physical Therapy   Ambulatory referral to Neurology     Digestive   RESOLVED: Chronic diarrhea    This has resolved        Genitourinary   BPH (benign prostatic hyperplasia)    Continue with tamsulosin        Other   Alcohol use    Not using alcohol currently will monitor      Hyperlipidemia    Continue current therapy      Relevant Medications   valsartan-hydrochlorothiazide (DIOVAN-HCT) 160-25 MG tablet  35 min spent on hx physical, review of old records and dc summary, pt education, high complex in decision making also extra time needed to fill out paperwork   Return in about 4 months (around 09/09/2022) for htn.    Shan Levans, MD

## 2022-05-10 NOTE — Assessment & Plan Note (Signed)
Continue current therapy 

## 2022-05-10 NOTE — Assessment & Plan Note (Signed)
This has resolved.

## 2022-05-10 NOTE — Assessment & Plan Note (Signed)
Patient cleared to return to work with minimal restrictions August 28 paperwork was filled out will be processed sent in along with chart notes  Patient needs neurology consultation and outpatient physical therapy he may need to miss some work to go to physical therapy

## 2022-05-10 NOTE — Assessment & Plan Note (Signed)
Continue with tamsulosin 

## 2022-05-10 NOTE — Patient Instructions (Addendum)
Start valsartan HCT 1 daily for blood pressure Stay on amlodipine 1 daily for blood pressure Resume thiamine 1 pill daily to help improve your neurologic function All medications sent to your pharmacy [facility]  Referral to physical therapy will be made  Referral to neurology Dr. Marlis Edelson office will be made for for stroke follow-up  Continue to use healthy diet as we discussed and eat 3 meals a day  Stay on your cholesterol medications as prescribed  Follow your blood pressure at home and blood pressure meter prescription was sent to your pharmacy  See Franky Macho our clinical pharmacist in 3 weeks for blood pressure follow-up and then Dr. Delford Field in 4 months  Your FMLA paperwork was completed and we sent into the companies insurance agent

## 2022-05-10 NOTE — Assessment & Plan Note (Signed)
Hypertension not well controlled we will add valsartan HCT and continue amlodipine patient returns short-term for clinical pharmacy

## 2022-05-10 NOTE — Assessment & Plan Note (Signed)
Not using alcohol currently will monitor

## 2022-05-19 ENCOUNTER — Ambulatory Visit: Payer: PRIVATE HEALTH INSURANCE | Attending: Critical Care Medicine | Admitting: Physical Therapy

## 2022-05-19 VITALS — BP 142/88 | HR 59

## 2022-05-19 DIAGNOSIS — I639 Cerebral infarction, unspecified: Secondary | ICD-10-CM | POA: Insufficient documentation

## 2022-05-19 DIAGNOSIS — R2681 Unsteadiness on feet: Secondary | ICD-10-CM | POA: Diagnosis present

## 2022-05-19 NOTE — Therapy (Signed)
OUTPATIENT PHYSICAL THERAPY VESTIBULAR EVALUATION ONLY     Patient Name: Paul [name] MRN: [id number] DOB:05-09-1961, 62 y.o., male Today's Date: 05/19/2022  PCP: Storm Frisk, MD  REFERRING PROVIDER: Storm Frisk, MD    PT End of Session - 05/19/22 1158     Visit Number 1    Number of Visits 1    Date for PT Re-Evaluation 06/18/22    Authorization Type [facility]    PT Start Time 1103    PT Stop Time 1145    PT Time Calculation (min) 42 min    Activity Tolerance Patient tolerated treatment well    Behavior During Therapy [facility] for tasks assessed/performed             Past Medical History:  Diagnosis Date   Acute CVA (cerebrovascular accident) (HCC) 08/25/2020   Hypertension    Stroke (HCC)    Urinary frequency    Past Surgical History:  Procedure Laterality Date   COLONOSCOPY WITH PROPOFOL N/A 01/31/2016   Procedure: COLONOSCOPY WITH PROPOFOL;  Surgeon: Charolett Bumpers, MD;  Location: [facility] ENDOSCOPY;  Service: Endoscopy;  Laterality: N/A;   NO PAST SURGERIES     Patient Active Problem List   Diagnosis Date Noted   Hyperlipidemia 03/07/2022   CVA (cerebral vascular accident) (HCC) 02/18/2022   Tympanic membrane perforation, left 10/06/2020   HTN (hypertension) 08/25/2020   BPH (benign prostatic hyperplasia) 08/25/2020   History of tobacco use 08/25/2020   Alcohol use 08/25/2020    ONSET DATE: 05/10/2022   REFERRING DIAG: I63.9 (ICD-10-CM) - Cerebrovascular accident (CVA), unspecified mechanism (HCC)   THERAPY DIAG:  Unsteadiness on feet  Rationale for Evaluation and Treatment Rehabilitation  SUBJECTIVE:   SUBJECTIVE STATEMENT: Not really having any dizziness. Has not had any dizziness. When he is walking and turning his head then it feels different. Was given VOR exercises in the hospital and that helped.   Pt accompanied by: self  PERTINENT HISTORY: Pt is a 61 y/o M presented to ED on 02/19/22 with dizziness/headach/nausea x1week. MRI  revealing new ischemic infarct at the right periatrial matter with small evolving subacute small vessel infarct. PMH includes CVA (2021), HTN, and urinary frequency.  During his hospitalization, MRI revealed right periatrial matter with small evolving subacute vessel infarct. He was released from the hospital on June 5 after returning to baseline.   PAIN:  Are you having pain? Yes, pain/soreness in both legs when walking longer distances.   Vitals:   05/19/22 1111  BP: (!) 142/88  Pulse: (!) 59     PRECAUTIONS: None  FALLS: Has patient fallen in last 6 months? 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   PATIENT GOALS "Not sure, they just told me to come over here"   OBJECTIVE:   GAIT: Gait pattern: WFL Distance walked: Clinic distances Assistive device utilized: None Level of assistance: Complete Independence    VESTIBULAR ASSESSMENT   GENERAL OBSERVATION: Ambulates in with no AD.    SYMPTOM BEHAVIOR:   Subjective history: See above.    Non-Vestibular symptoms:  needs to wear glasses.   Type of dizziness:  N/A , states he has not had dizziness in a while only when turning his head quickly.    Frequency: N/A   Duration: N/A  Aggravating factors: Induced by motion: turning head quickly   Relieving factors:  staying in one place.    Progression of symptoms: better   OCULOMOTOR EXAM:   Ocular Alignment: normal   Ocular ROM: No Limitations   Spontaneous Nystagmus: absent   Gaze-Induced Nystagmus: absent   Smooth Pursuits:  pt with difficulty tracking, slower, and loses pen at times. Reports difficulty with this.    Saccades: intact     VESTIBULAR - OCULAR REFLEX:    Slow VOR: Normal - reports a little  dizziness   VOR Cancellation: Normal - reports feeling uncomfortable.    Head-Impulse Test: HIT Right: positive HIT Left: negative Reports not feeling comfortable with this.       POSITIONAL TESTING: Right Dix-Hallpike: no nystagmus Left Dix-Hallpike: no nystagmus Right Roll Test: no nystagmus Left Roll Test: no nystagmus  No dizziness in any positions.    MOTION SENSITIVITY:    Motion Sensitivity Quotient  Intensity: 0 = none, 1 = Lightheaded, 2 = Mild, 3 = Moderate, 4 = Severe, 5 = Vomiting  Intensity  1. Sitting to supine 0  2. Supine to L side 0  3. Supine to R side 0  4. Supine to sitting 0 "feels uncomfortable"  5. L Hallpike-Dix 0  6. Up from L  0  7. R Hallpike-Dix 0  8. Up from R  0  9. Sitting, head  tipped to L knee   10. Head up from L  knee   11. Sitting, head  tipped to R knee   12. Head up from R  knee   13. Sitting head turns x5   14.Sitting head nods x5   15. In stance, 180  turn to L    16. In stance, 180  turn to R       FUNCTIONAL GAIT:   [facility] PT Assessment - 05/19/22 1132       Functional Gait  Assessment   Gait assessed  Yes    Gait Level Surface Walks 20 ft in less than 5.5 sec, no assistive devices, good speed, no evidence for imbalance, normal gait pattern, deviates no more than 6 in outside of the 12 in walkway width.    Change in Gait Speed Able to smoothly change walking speed without loss of balance or gait deviation. Deviate no more than 6 in outside of the 12 in walkway width.    Gait with Horizontal Head Turns Performs head turns smoothly with slight change in gait velocity (eg, minor disruption to smooth gait path), deviates 6-10 in outside 12 in walkway width, or uses an assistive device.   feeling discomfort   Gait with Vertical Head Turns Performs head turns with no change in gait. Deviates no more than 6 in outside 12 in walkway width.    Gait and Pivot Turn Pivot turns safely within 3 sec and stops quickly with no loss of  balance.    Step Over Obstacle Is able to step over 2 stacked shoe boxes taped together (9 in total height) without changing gait speed. No evidence of imbalance.    Gait with Narrow Base of Support Is able to ambulate for 10 steps heel to toe with no staggering.    Gait with Eyes Closed Walks 20 ft, no assistive devices, good speed, no evidence of imbalance, normal gait pattern, deviates no more than 6 in outside 12 in walkway width. Ambulates 20 ft in less than 7 sec.    Ambulating Backwards Walks 20  ft, no assistive devices, good speed, no evidence for imbalance, normal gait    Steps Alternating feet, no rail.    Total Score 29    FGA comment: 29/30 = No Fall Risk              M-CTSIB  Condition 1: Firm Surface, EO 30 Sec, Normal Sway  Condition 2: Firm Surface, EC 30 Sec, Normal Sway  Condition 3: Foam Surface, EO 30 Sec, Normal Sway  Condition 4: Foam Surface, EC 30 Sec, Mild Sway        VESTIBULAR TREATMENT:  Initiated HEP for balance/VOR: Access Code: [email or web address] URL: [email or web address] Date: 05/19/2022 Prepared by: Sherlie Ban  Exercises - Romberg Stance Eyes Closed on Foam Pad  - 1-2 x daily - 5 x weekly - 3 sets - 30 hold  Standing VOR x30 seconds in horizontal/vertical directions (see pt instructions). Pt more challenged in horizontal direction, but improved with incr reps.    PATIENT EDUCATION: Education details: Clinical findings, HEP, pt not needing skilled PT at this time with pt in agreement. Evaluation only.  Person educated: Patient Education method: Explanation, Verbal cues, and Handouts Education comprehension: verbalized understanding   GOALS: Goals N/A - Evaluation Only.  ASSESSMENT:  CLINICAL IMPRESSION: Patient is a 61 year old male referred to Neuro OPPT for hx of CVA/vertigo.   Pt's PMH is significant for: CVA (2021), HTN, and urinary frequency.The following deficits were present during the exam: positive HIT to R  indicating impaired VOR and feeling mildly unsteady with head motions. Pt with negative positional testing. Pt able to perform all 4 conditions of mCTSIB for 30 seconds, but with mild postural sway on condition 4. Pt scoring a 29/30 on the FGA, indicating pt is not at a fall risk. Pt does not need skilled PT services at this time and was given an HEP for VOR x1 in standing and balance with eyes closed. Pt able to verbalize/demonstrate understanding. Pt in agreement with plan for eval only.     OBJECTIVE IMPAIRMENTS decreased balance.    REHAB POTENTIAL: Excellent  CLINICAL DECISION MAKING: Stable/uncomplicated  EVALUATION COMPLEXITY: Low   PLAN: PT FREQUENCY: one time visit   PLAN FOR NEXT SESSION: N/A - Eval Only   Drake Leach, PT, DPT 05/19/2022, 11:59 AM

## 2022-05-19 NOTE — Patient Instructions (Addendum)
Gaze Stabilization: Standing Feet Apart    Feet shoulder width apart, keeping eyes on X target on a plain wall a few feet away, tilt head down 15-30 and move head side to side for _30___ seconds.   Repeat while moving head up and down for __30__ seconds.  Perform 3 times each. Do __1-2__ sessions per day.  Keep eyes focused on the target! When 30 seconds gets easy, can try for 60 seconds.   Copyright  VHI. All rights reserved.

## 2022-06-07 NOTE — Progress Notes (Unsigned)
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 PMH is significant for CVA x2, HTN, DM, and HLD.   Patient was referred and last seen by Primary Care Provider, Dr. Shan Levans, on 8/23. At that visit, his BP was elevated 165/91. He reported stopping the use of alcohol. Valsartan/HCTZ was added at that visit.   Today, patient arrives in good spirits and presents without assistance. Denies headache, blurred vision, swelling. He said he has experienced some dizziness, but it is not accompanied with any drops in BP. He checks his BP multiple times per day, and typically once each day prior to taking his BP medications. This led to some inconsistent readings that were verbalized. He stated he will see a SBP reading in the 100's every other day, but typically his systolic is in the 140-150's.  Family/Social history:  -Tobacco: quit in 2021  -Alcohol: states he is down to 99% less than what he used to intake  Medication adherence appropriate . Patient has taken BP medications today. He has not missed any doses, however, he does not take them at the same time every day. He arrived with a bag full of all of the medications he is currently taking. He is not currently taking a daily baby aspirin and was not aware of a metoprolol prescription.   Current antihypertensives include: valsartan/hctz 160-25mg  once daily, amlodipine 10mg  once daily, metoprolol succinate 25mg  once daily (not taking)  Antihypertensives tried in the past include: benazepril    Patient reported dietary habits: Eats 1-2 meals/day -white rice, veggies, meat  -slowed down on take out and fried food in 6 months -gets veggies at restaurant  -really no fried food anymore   Patient-reported exercise habits: walk in the neighborhood some    O:  Vitals:   06/08/22 1131  BP: 107/67  Pulse: 81     Last 3 Office BP readings: BP Readings from Last 3  Encounters:  05/19/22 (!) 142/88  05/10/22 (!) 165/91  03/06/22 (!) 142/82    BMET    Component Value Date/Time   NA 141 03/06/2022 1635   K 3.8 03/06/2022 1635   CL 102 03/06/2022 1635   CO2 23 03/06/2022 1635   GLUCOSE 97 03/06/2022 1635   GLUCOSE 120 (H) 02/18/2022 1430   BUN 14 03/06/2022 1635   CREATININE 0.80 03/06/2022 1635   CALCIUM 8.6 03/06/2022 1635   GFRNONAA >60 02/18/2022 1424   GFRAA >60 03/09/2019 1553    Renal function: CrCl cannot be calculated (Patient's most recent lab result is older than the maximum 21 days allowed.).  Clinical ASCVD: Yes   A/P: Hypertension diagnosed currently controlled on current medications. BP goal < 130/80 mmHg. Medication adherence appears appropriate, however, he is unaware of the names and indications of his medications. -Continued valsartan/hctz 160-25mg  once daily -Continued amlodipine 10mg  once daily. -Discontinued metoprolol d/t nonadherence and historically low HR.  -Patient educated on purpose, proper use, and potential adverse effects of valsartan/HCTZ.  -F/u labs ordered - CMP -Counseled on lifestyle modifications for blood pressure control including reduced dietary sodium, increased exercise, adequate sleep. -Encouraged patient to check BP at home once a day ~2 hours after taking his medications and bring log of readings to next visit. Counseled on proper use of home BP cuff.  -Provided education on his medication names and indications with written information. -Initiated aspirin 81mg  once a day and told him to  pick this up OTC.   Written patient instructions provided. Patient verbalized understanding of treatment plan.  Total time in face to face counseling 30 minutes.    Follow-up:  Pharmacist in one month. PCP clinic visit in November.  Maryan Puls, PharmD PGY-1 [facility] Resident

## 2022-06-08 ENCOUNTER — Other Ambulatory Visit: Payer: Self-pay | Admitting: Pharmacist

## 2022-06-08 ENCOUNTER — Other Ambulatory Visit: Payer: Self-pay

## 2022-06-08 ENCOUNTER — Ambulatory Visit: Payer: PRIVATE HEALTH INSURANCE | Attending: Critical Care Medicine | Admitting: Pharmacist

## 2022-06-08 VITALS — BP 107/67 | HR 81

## 2022-06-08 DIAGNOSIS — I10 Essential (primary) hypertension: Secondary | ICD-10-CM | POA: Diagnosis not present

## 2022-06-08 DIAGNOSIS — N401 Enlarged prostate with lower urinary tract symptoms: Secondary | ICD-10-CM

## 2022-06-08 DIAGNOSIS — E782 Mixed hyperlipidemia: Secondary | ICD-10-CM

## 2022-06-08 DIAGNOSIS — Z8673 Personal history of transient ischemic attack (TIA), and cerebral infarction without residual deficits: Secondary | ICD-10-CM

## 2022-06-08 MED ORDER — EZETIMIBE 10 MG PO TABS
10.0000 mg | ORAL_TABLET | Freq: Every day | ORAL | 2 refills | Status: DC
  Filled 2022-06-08: qty 60, 60d supply, fill #0

## 2022-06-08 MED ORDER — ASPIRIN 81 MG PO TBEC
81.0000 mg | DELAYED_RELEASE_TABLET | Freq: Every day | ORAL | 11 refills | Status: AC
  Filled 2022-06-08: qty 30, 30d supply, fill #0

## 2022-06-08 MED ORDER — AMLODIPINE BESYLATE 10 MG PO TABS
10.0000 mg | ORAL_TABLET | Freq: Every day | ORAL | 2 refills | Status: DC
  Filled 2022-06-08: qty 60, 60d supply, fill #0

## 2022-06-08 MED ORDER — TAMSULOSIN HCL 0.4 MG PO CAPS
0.4000 mg | ORAL_CAPSULE | Freq: Every day | ORAL | 3 refills | Status: DC
  Filled 2022-06-08: qty 30, 30d supply, fill #0

## 2022-06-08 MED ORDER — VALSARTAN-HYDROCHLOROTHIAZIDE 160-25 MG PO TABS
1.0000 | ORAL_TABLET | Freq: Every day | ORAL | 3 refills | Status: DC
  Filled 2022-06-08: qty 90, 90d supply, fill #0

## 2022-06-08 MED ORDER — THIAMINE HCL 100 MG PO TABS
100.0000 mg | ORAL_TABLET | Freq: Every day | ORAL | 2 refills | Status: DC
  Filled 2022-06-08: qty 30, 30d supply, fill #0

## 2022-06-08 MED ORDER — FOLIC ACID 1 MG PO TABS
1.0000 mg | ORAL_TABLET | Freq: Every day | ORAL | 2 refills | Status: DC
  Filled 2022-06-08: qty 30, 30d supply, fill #0

## 2022-06-08 MED ORDER — ATORVASTATIN CALCIUM 80 MG PO TABS
80.0000 mg | ORAL_TABLET | Freq: Every day | ORAL | 2 refills | Status: DC
  Filled 2022-06-08: qty 60, 60d supply, fill #0

## 2022-06-08 MED ORDER — METOPROLOL SUCCINATE ER 25 MG PO TB24
25.0000 mg | ORAL_TABLET | Freq: Every day | ORAL | 3 refills | Status: DC
  Filled 2022-06-08: qty 90, 90d supply, fill #0

## 2022-06-09 ENCOUNTER — Telehealth: Payer: Self-pay

## 2022-06-09 ENCOUNTER — Other Ambulatory Visit: Payer: Self-pay

## 2022-06-09 ENCOUNTER — Other Ambulatory Visit: Payer: Self-pay | Admitting: Critical Care Medicine

## 2022-06-09 DIAGNOSIS — E876 Hypokalemia: Secondary | ICD-10-CM | POA: Insufficient documentation

## 2022-06-09 LAB — CMP14+EGFR
ALT: 48 IU/L — ABNORMAL HIGH (ref 0–44)
AST: 35 IU/L (ref 0–40)
Albumin/Globulin Ratio: 1.6 (ref 1.2–2.2)
Albumin: 4.6 g/dL (ref 3.9–4.9)
Alkaline Phosphatase: 103 IU/L (ref 44–121)
BUN/Creatinine Ratio: 16 (ref 10–24)
BUN: 20 mg/dL (ref 8–27)
Bilirubin Total: 0.4 mg/dL (ref 0.0–1.2)
CO2: 26 mmol/L (ref 20–29)
Calcium: 9 mg/dL (ref 8.6–10.2)
Chloride: 99 mmol/L (ref 96–106)
Creatinine, Ser: 1.25 mg/dL (ref 0.76–1.27)
Globulin, Total: 2.8 g/dL (ref 1.5–4.5)
Glucose: 243 mg/dL — ABNORMAL HIGH (ref 70–99)
Potassium: 3.2 mmol/L — ABNORMAL LOW (ref 3.5–5.2)
Sodium: 138 mmol/L (ref 134–144)
Total Protein: 7.4 g/dL (ref 6.0–8.5)
eGFR: 65 mL/min/{1.73_m2} (ref >59)

## 2022-06-09 MED ORDER — POTASSIUM CHLORIDE ER 10 MEQ PO TBCR
10.0000 meq | EXTENDED_RELEASE_TABLET | Freq: Every day | ORAL | 2 refills | Status: DC
  Filled 2022-06-09: qty 60, 60d supply, fill #0

## 2022-06-09 NOTE — Telephone Encounter (Signed)
Pt was called and no vm was left due to mailbox being full. Information has been sent to nurse pool.   Letter will be sent  

## 2022-06-09 NOTE — Progress Notes (Signed)
Let pt know he needs to start low dose potassium one daily I sent to our pharmacy

## 2022-06-09 NOTE — Telephone Encounter (Signed)
-----   Message from Elsie Stain, MD sent at 06/09/2022  7:09 AM EDT ----- Let pt know he needs to start low dose potassium one daily I sent to our pharmacy

## 2022-06-14 ENCOUNTER — Other Ambulatory Visit: Payer: Self-pay

## 2022-06-15 ENCOUNTER — Other Ambulatory Visit: Payer: Self-pay

## 2022-06-28 ENCOUNTER — Other Ambulatory Visit: Payer: Self-pay | Admitting: Critical Care Medicine

## 2022-06-28 DIAGNOSIS — N401 Enlarged prostate with lower urinary tract symptoms: Secondary | ICD-10-CM

## 2022-07-12 NOTE — Progress Notes (Deleted)
   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 PMH is significant for CVA x2, HTN, DM, and HLD.    Patient was referred and last seen by Primary Care Provider, Dr. Asencion Noble, on 8/23. At that visit, his BP was elevated 165/91. He reported stopping the use of alcohol. Valsartan/HCTZ was added at that visit.    At last visit with me, patient reported he checks his BP multiple times per day, and typically once each day prior to taking his BP medications, leading to a variation in readings provided at that time. Additionally, he was not aware of a metoprolol prescription. Metoprolol was discontinued at that time. BP was also soft at that time with a reading of 107/67.   Today, patient arrives in *** spirits and presents without *** assistance. *** Denies dizziness, headache, blurred vision, swelling.   Family/Social history:  -Tobacco: quit in 2021  -Alcohol: states he is down to 99% less than what he used to intake  Medication adherence *** . Patient has *** taken BP medications today.   Current antihypertensives include: valsartan/hctz 160-25mg  once daily, amlodipine 10mg  once daily   Antihypertensives tried in the past include: benazepril     Patient reported dietary habits: Eats 1-2 meals/day -white rice, veggies, meat  -slowed down on take out and fried food in 6 months -gets veggies at restaurant  -really no fried food anymore    Patient-reported exercise habits: walk in the neighborhood some   O:    Last 3 Office BP readings: BP Readings from Last 3 Encounters:  06/08/22 107/67  05/19/22 (!) 142/88  05/10/22 (!) 165/91    BMET    Component Value Date/Time   NA 138 06/08/2022 1202   K 3.2 (L) 06/08/2022 1202   CL 99 06/08/2022 1202   CO2 26 06/08/2022 1202   GLUCOSE 243 (H) 06/08/2022 1202   GLUCOSE 120 (H) 02/18/2022 1430   BUN 20 06/08/2022 1202   CREATININE 1.25 06/08/2022  1202   CALCIUM 9.0 06/08/2022 1202   GFRNONAA >60 02/18/2022 1424   GFRAA >60 03/09/2019 1553    Renal function: CrCl cannot be calculated (Patient's most recent lab result is older than the maximum 21 days allowed.).  Clinical ASCVD: Yes  The ASCVD Risk score (Arnett DK, et al., 2019) failed to calculate for the following reasons:   The patient has a prior MI or stroke diagnosis  A/P: Hypertension diagnosed *** currently *** on current medications. BP goal < 130/80 *** mmHg. Medication adherence appears ***. Control is suboptimal due to ***.  -{Meds adjust:18428} ***.  -Patient educated on purpose, proper use, and potential adverse effects of ***.  -F/u labs ordered - *** -Counseled on lifestyle modifications for blood pressure control including reduced dietary sodium, increased exercise, adequate sleep. -Encouraged patient to check BP at home and bring log of readings to next visit. Counseled on proper use of home BP cuff.    Results reviewed and written information provided.    Written patient instructions provided. Patient verbalized understanding of treatment plan.  Total time in face to face counseling *** minutes.    Follow-up:  Pharmacist ***. PCP clinic visit in November  Maryan Puls, PharmD PGY-1 [facility] Resident

## 2022-07-13 ENCOUNTER — Ambulatory Visit: Payer: PRIVATE HEALTH INSURANCE | Admitting: Pharmacist

## 2022-07-15 IMAGING — MR MR HEAD W/O CM
6 of 10 series · 29 of 48 positions shown · non-contrast
Comparison: CT from earlier the same day and MRI from 08/25/2020.

CLINICAL DATA: Initial evaluation for acute dizziness.

EXAM:
MRI HEAD WITHOUT CONTRAST
TECHNIQUE: Multiplanar, multiecho pulse sequences of the brain and surrounding
structures were obtained without intravenous contrast.

[Series 2: DWI · axial · 3.0mm · 0.94mm/px · z∈[-65,+77]mm · 9 of 99 slices shown (1 of 2)]
[im 1/99]
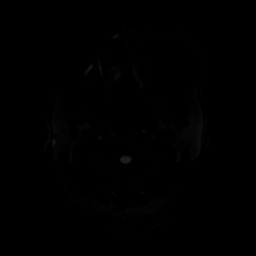
[im 13/99]
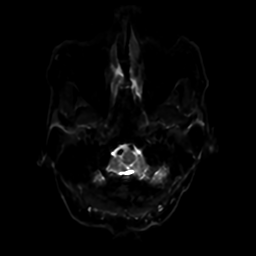
[im 25/99]
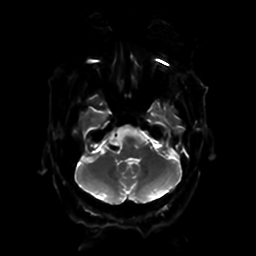
[im 37/99]
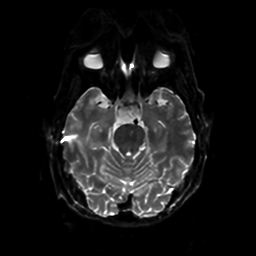
[im 50/99]
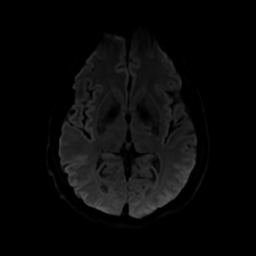
[im 62/99]
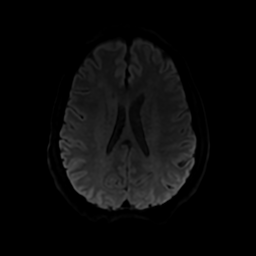
[im 74/99]
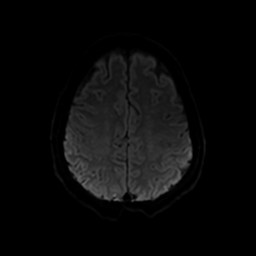
[im 86/99]
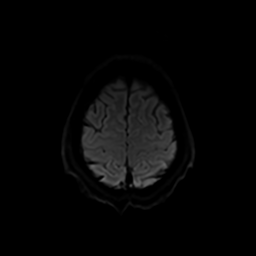
[im 99/99]
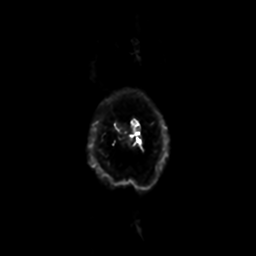

[Series 3: DWI · coronal · 4.0mm · 0.94mm/px · 7 of 73 slices shown (2 of 2)]
[im 1/73]
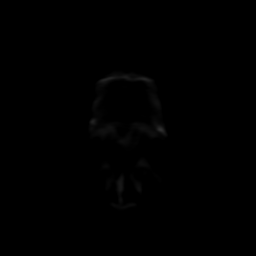
[im 13/73]
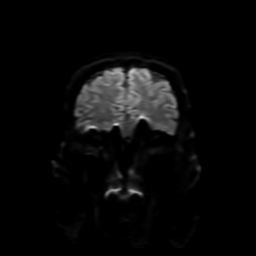
[im 25/73]
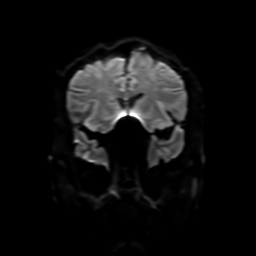
[im 37/73]
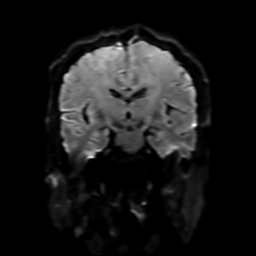
[im 49/73]
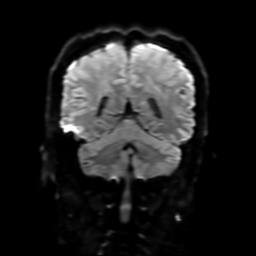
[im 61/73]
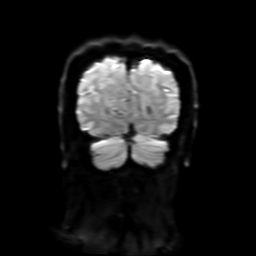
[im 73/73]
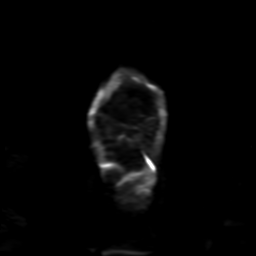

[Series 4: FLAIR · sagittal · 5.0mm · 0.23mm/px · 2 of 24 slices shown (1 of 2)]
[im 1/24]
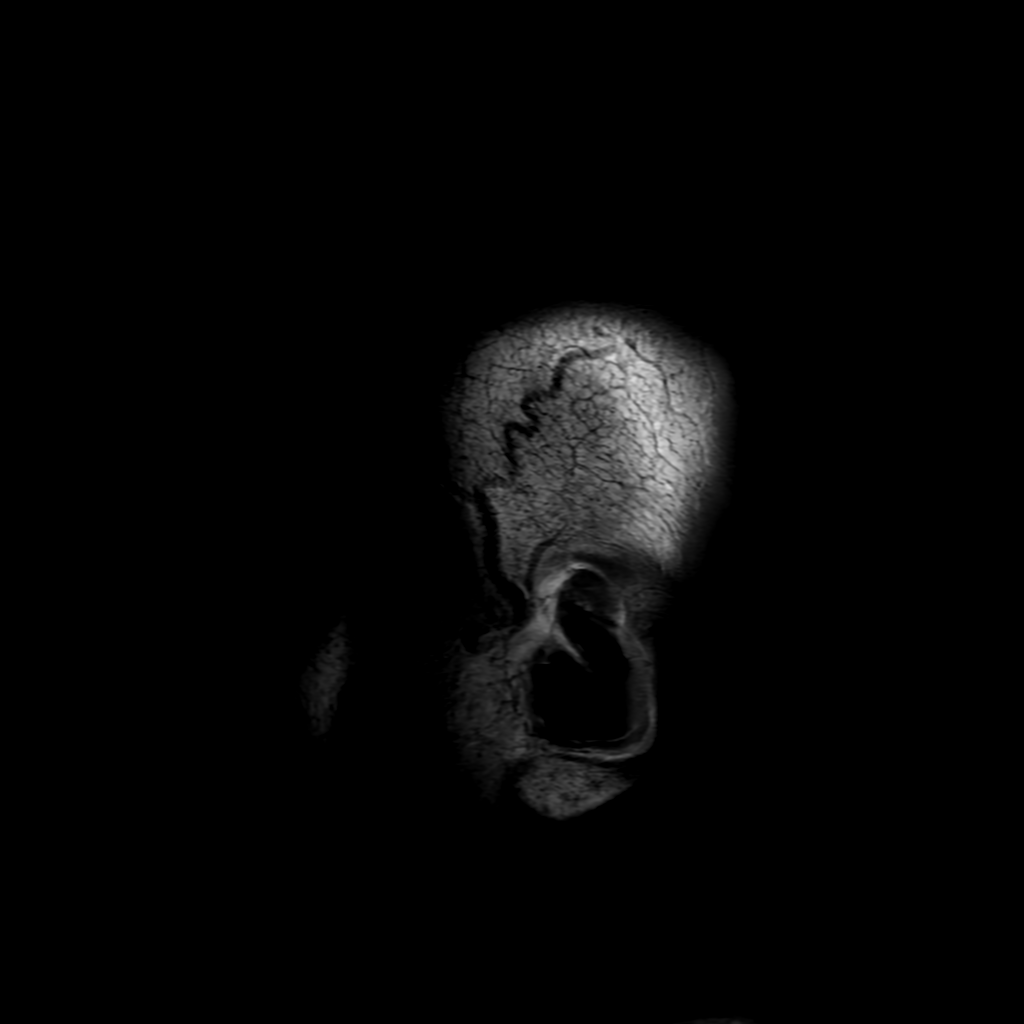
[im 24/24]
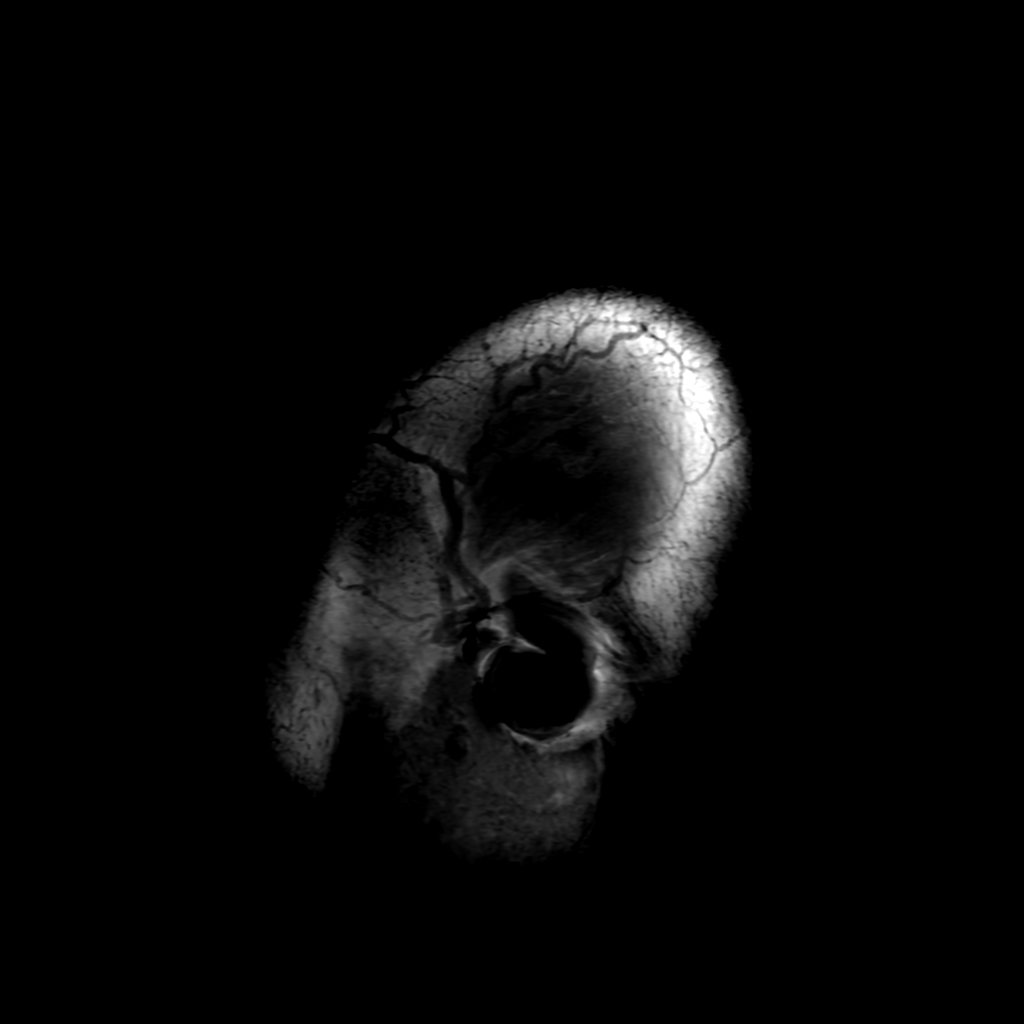

[Series 6: FLAIR · axial · 4.0mm · 0.45mm/px · z∈[-65,+79]mm · 3 of 35 slices shown (2 of 2)]
[im 1/35]
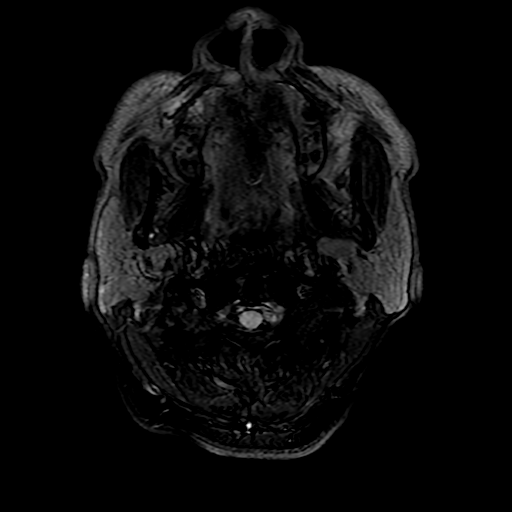
[im 18/35]
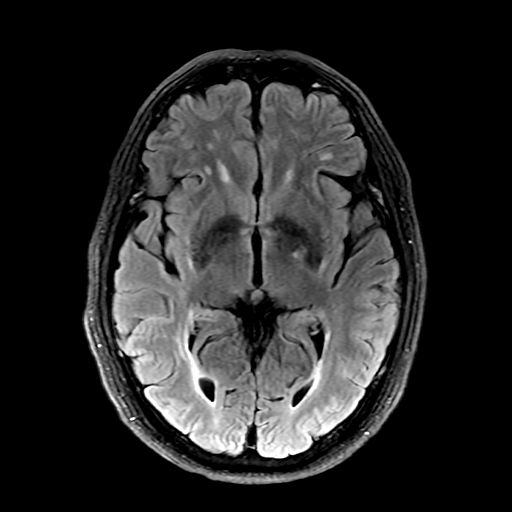
[im 35/35]
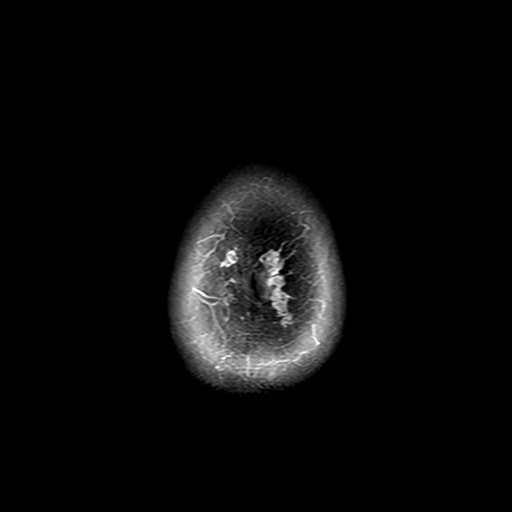

[Series 250: ADC · axial · 3.0mm · 0.94mm/px · z∈[-65,+77]mm · 5 of 50 slices shown (1 of 2)]
[im 1/50]
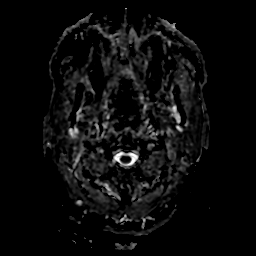
[im 13/50]
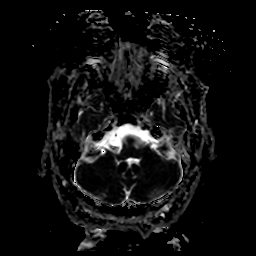
[im 25/50]
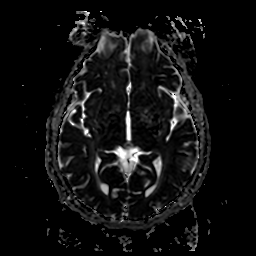
[im 37/50]
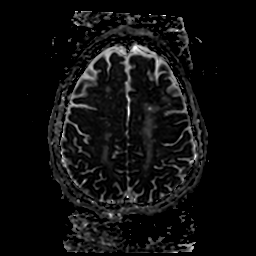
[im 50/50]
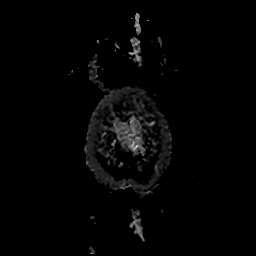

[Series 350: ADC · coronal · 4.0mm · 0.94mm/px · 3 of 37 slices shown (2 of 2)]
[im 1/37]
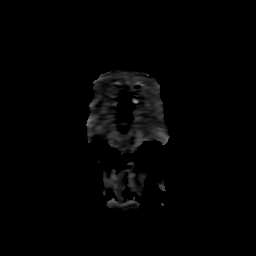
[im 19/37]
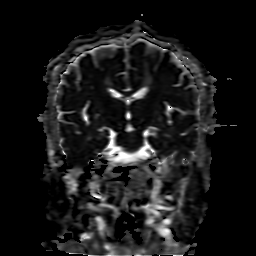
[im 37/37]
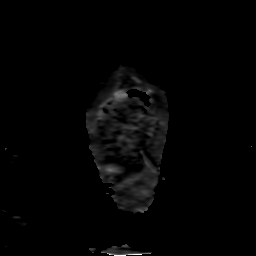

[29 of 48 positions shown; findings below may reference images not displayed]

FINDINGS: Brain: Cerebral volume within normal limits for age. Patchy and
confluent T2/FLAIR hyperintensity involving the periventricular and
deep white matter of both cerebral hemispheres, most consistent with
chronic small vessel ischemic disease, moderate to advanced in
nature. Remote right pontine lacunar infarct noted. Few additional
scattered remote lacunar infarcts present about the corona radiata
bilaterally.

5 mm focus of diffusion signal abnormality seen involving the right
periatrial white matter (series 2, image 27). Associated T2/FLAIR
signal intensity without definite ADC correlate, likely a small
evolving subacute small vessel infarct. No associated hemorrhage or
mass effect. No other evidence for acute or subacute ischemia.
Gray-white matter differentiation otherwise maintained. No acute
intracranial hemorrhage. Multiple scattered chronic micro
hemorrhages noted, most likely related to chronic poorly controlled
hypertension.

No mass lesion or midline shift. No hydrocephalus or extra-axial
fluid collection. Pituitary gland suprasellar region normal.

Vascular: Major intracranial vascular flow voids are maintained.

Skull and upper cervical spine: Bone marrow signal intensity within
normal limits. Normal craniocervical junction. No scalp soft tissue
abnormality.

Sinuses/Orbits: Globes and orbital soft tissues within normal
limits. Paranasal sinuses are largely clear. Chronic left mastoid
effusion.

Other: None.
IMPRESSION: 1. 5 mm focus of diffusion signal abnormality involving the right
periatrial white matter, consistent with a small evolving subacute
small vessel infarct. No associated hemorrhage or mass effect.
2. No other acute intracranial abnormality.
3. Moderately advanced chronic microvascular ischemic disease with
multiple remote lacunar infarcts about the bilateral corona radiata
and pons.

## 2022-08-01 IMAGING — DX DG CHEST 2V
2 series · 2 of 2 positions shown · non-contrast
Comparison: 08/04/2020.

CLINICAL DATA: Lung infiltrate.  Cough, night sweats.

EXAM:
CHEST - 2 VIEW

[dg chest 2 view (1 of 2)]
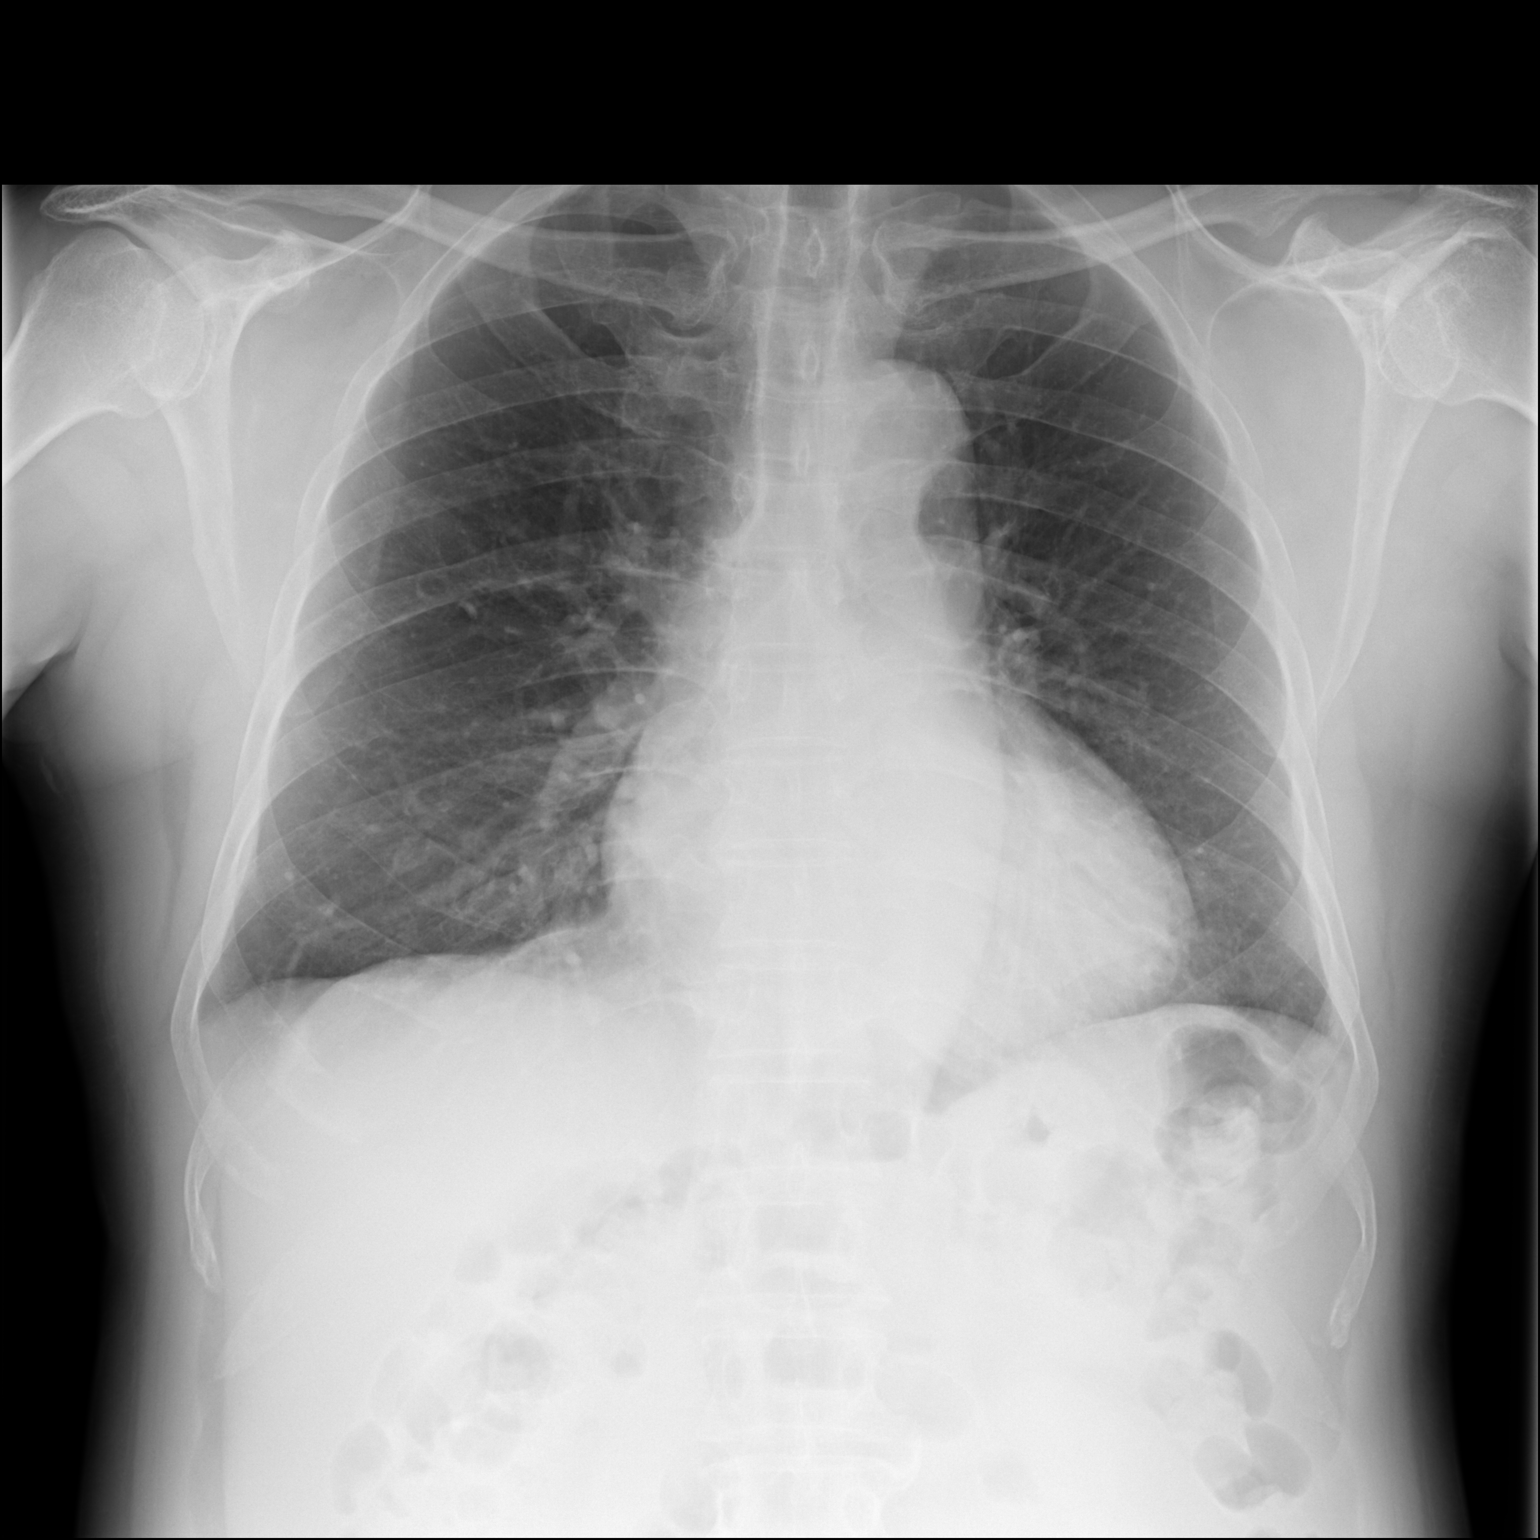

[dg chest 2 view (2 of 2)]
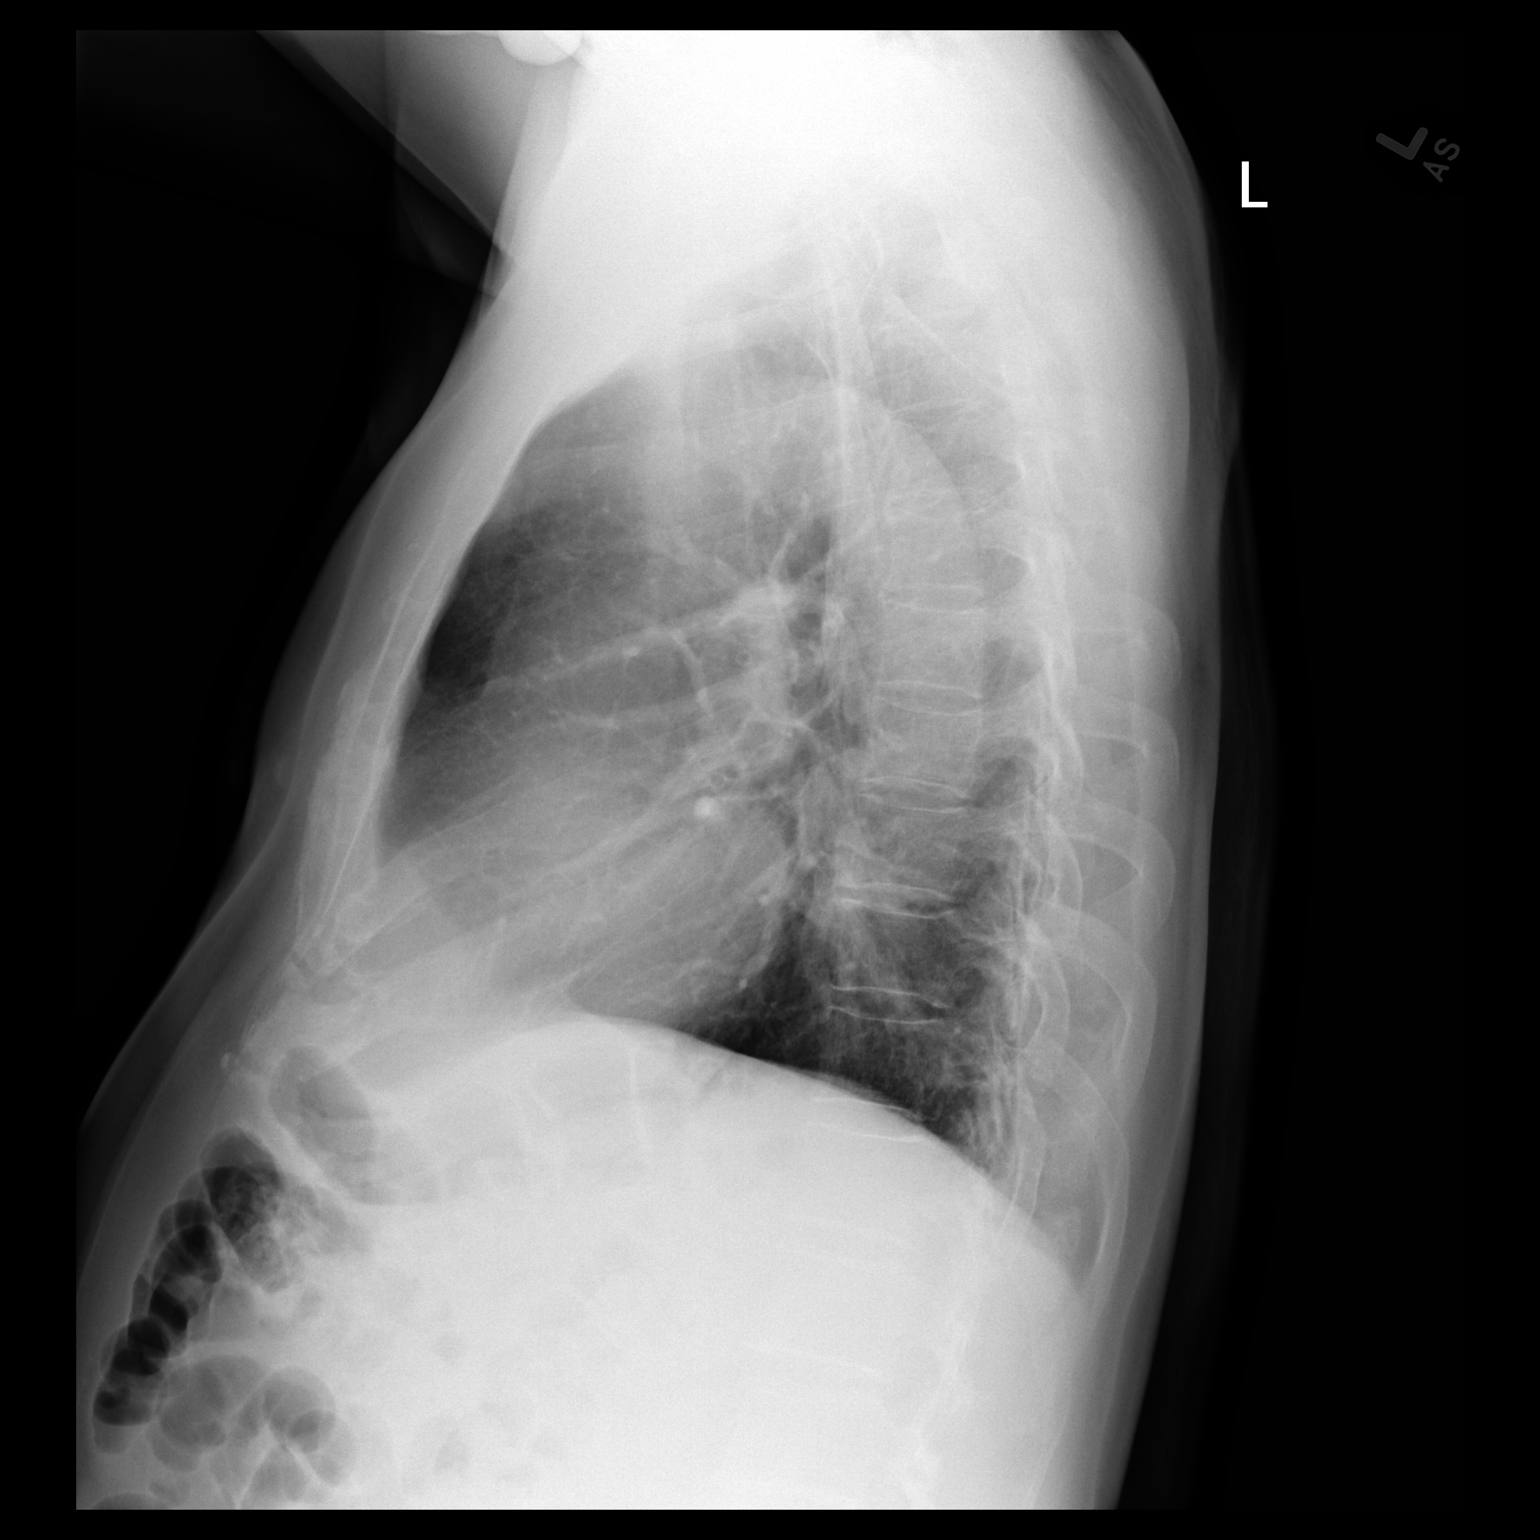

[2 of 2 positions shown; findings below may reference images not displayed]

FINDINGS: The heart is enlarged and the mediastinal contour is within normal
limits. No consolidation, effusion, or pneumothorax. No acute
osseous abnormality.
IMPRESSION: No active cardiopulmonary disease.

## 2022-08-05 ENCOUNTER — Other Ambulatory Visit: Payer: Self-pay | Admitting: Critical Care Medicine

## 2022-08-05 DIAGNOSIS — Z8673 Personal history of transient ischemic attack (TIA), and cerebral infarction without residual deficits: Secondary | ICD-10-CM

## 2022-08-05 DIAGNOSIS — E782 Mixed hyperlipidemia: Secondary | ICD-10-CM

## 2022-08-05 DIAGNOSIS — I10 Essential (primary) hypertension: Secondary | ICD-10-CM

## 2022-08-07 NOTE — Telephone Encounter (Signed)
Remainder of last refill sent to pharmacy pt has requested. Requested Prescriptions  Pending Prescriptions Disp Refills   amLODipine (NORVASC) 10 MG tablet [Pharmacy Med Name: AMLODIPINE BESYLATE 10 MG TAB] 60 tablet 0    Sig: TAKE 1 TABLET BY MOUTH EVERY DAY     Cardiovascular: Calcium Channel Blockers 2 Passed - 08/05/2022  1:35 AM      Passed - Last BP in normal range    BP Readings from Last 1 Encounters:  06/08/22 107/67         Passed - Last Heart Rate in normal range    Pulse Readings from Last 1 Encounters:  06/08/22 81         Passed - Valid encounter within last 6 months    Recent Outpatient Visits           2 months ago Primary hypertension   [facility] [place], Cornelius Moras, RPH-CPP   2 months ago Cerebrovascular accident (CVA), unspecified mechanism (HCC)   [facility] Storm Frisk, MD   5 months ago Cerebrovascular accident (CVA), unspecified mechanism (HCC)   [facility] Storm Frisk, MD   1 year ago Primary hypertension   [facility] Grayce Sessions, NP   1 year ago Essential hypertension   [facility] Grayce Sessions, NP       Future Appointments             In 1 week Storm Frisk, MD [facility]             atorvastatin (LIPITOR) 80 MG tablet [Pharmacy Med Name: ATORVASTATIN 80 MG TABLET] 60 tablet 0    Sig: TAKE 1 TABLET BY MOUTH EVERY DAY     Cardiovascular:  Antilipid - Statins Failed - 08/05/2022  1:35 AM      Failed - Lipid Panel in normal range within the last 12 months    Cholesterol  Date Value Ref Range Status  02/19/2022 183 0 - 200 mg/dL Final   LDL Cholesterol  Date Value Ref Range Status  02/19/2022 117 (H) 0 - 99 mg/dL Final    Comment:           Total Cholesterol/HDL:CHD Risk Coronary Heart Disease Risk Table                     Men    Women  1/2 Average Risk   3.4   3.3  Average Risk       5.0   4.4  2 X Average Risk   9.6   7.1  3 X Average Risk  23.4   11.0        Use the calculated Patient Ratio above and the CHD Risk Table to determine the patient's CHD Risk.        ATP III CLASSIFICATION (LDL):  <100     mg/dL   Optimal  382-505  mg/dL   Near or Above                    Optimal  130-159  mg/dL   Borderline  397-673  mg/dL   High  >419     mg/dL   Very High Performed at [facility], [address], [place], [place] [address]    HDL  Date Value Ref Range Status  02/19/2022  51 >40 mg/dL Final   Triglycerides  Date Value Ref Range Status  02/19/2022 77 <150 mg/dL Final         Passed - Patient is not pregnant      Passed - Valid encounter within last 12 months    Recent Outpatient Visits           2 months ago Primary hypertension   [facility] [place], Cornelius Moras, RPH-CPP   2 months ago Cerebrovascular accident (CVA), unspecified mechanism (HCC)   [facility] Storm Frisk, MD   5 months ago Cerebrovascular accident (CVA), unspecified mechanism (HCC)   [facility] Storm Frisk, MD   1 year ago Primary hypertension   [facility] Grayce Sessions, NP   1 year ago Essential hypertension   [facility] Grayce Sessions, NP       Future Appointments             In 1 week Storm Frisk, MD [facility]             ezetimibe (ZETIA) 10 MG tablet [Pharmacy Med Name: EZETIMIBE 10 MG TABLET] 60 tablet 0    Sig: TAKE 1 TABLET BY MOUTH EVERY DAY     Cardiovascular:  Antilipid - Sterol Transport Inhibitors Failed - 08/05/2022  1:35 AM      Failed - ALT in normal range and within 360 days    ALT  Date Value Ref Range Status  06/08/2022 48 (H) 0 - 44 IU/L Final         Failed - Lipid Panel in normal  range within the last 12 months    Cholesterol  Date Value Ref Range Status  02/19/2022 183 0 - 200 mg/dL Final   LDL Cholesterol  Date Value Ref Range Status  02/19/2022 117 (H) 0 - 99 mg/dL Final    Comment:           Total Cholesterol/HDL:CHD Risk Coronary Heart Disease Risk Table                     Men   Women  1/2 Average Risk   3.4   3.3  Average Risk       5.0   4.4  2 X Average Risk   9.6   7.1  3 X Average Risk  23.4   11.0        Use the calculated Patient Ratio above and the CHD Risk Table to determine the patient's CHD Risk.        ATP III CLASSIFICATION (LDL):  <100     mg/dL   Optimal  154-008  mg/dL   Near or Above                    Optimal  130-159  mg/dL   Borderline  676-195  mg/dL   High  >093     mg/dL   Very High Performed at [facility], [address] [place], [place] [address]    HDL  Date Value Ref Range Status  02/19/2022 51 >40 mg/dL Final   Triglycerides  Date Value Ref Range Status  02/19/2022 77 <150 mg/dL Final         Passed - AST in normal range and within 360 days    AST  Date Value  Ref Range Status  06/08/2022 35 0 - 40 IU/L Final         Passed - Patient is not pregnant      Passed - Valid encounter within last 12 months    Recent Outpatient Visits           2 months ago Primary hypertension   [facility] [place], Cornelius Moras, RPH-CPP   2 months ago Cerebrovascular accident (CVA), unspecified mechanism (HCC)   [facility] Storm Frisk, MD   5 months ago Cerebrovascular accident (CVA), unspecified mechanism [facility])   [facility] Storm Frisk, MD   1 year ago Primary hypertension   [facility] Grayce Sessions, NP   1 year ago Essential hypertension   [facility] Grayce Sessions, NP       Future Appointments             In 1 week Delford Field Charlcie Cradle, MD  [facility]

## 2022-08-17 ENCOUNTER — Ambulatory Visit: Payer: Self-pay | Attending: Critical Care Medicine | Admitting: Critical Care Medicine

## 2022-08-17 ENCOUNTER — Encounter: Payer: Self-pay | Admitting: Critical Care Medicine

## 2022-08-17 VITALS — BP 137/88 | HR 70 | Ht 64.0 in | Wt 143.6 lb

## 2022-08-17 DIAGNOSIS — Z87898 Personal history of other specified conditions: Secondary | ICD-10-CM

## 2022-08-17 DIAGNOSIS — E782 Mixed hyperlipidemia: Secondary | ICD-10-CM

## 2022-08-17 DIAGNOSIS — E876 Hypokalemia: Secondary | ICD-10-CM

## 2022-08-17 DIAGNOSIS — K056 Periodontal disease, unspecified: Secondary | ICD-10-CM | POA: Insufficient documentation

## 2022-08-17 DIAGNOSIS — I10 Essential (primary) hypertension: Secondary | ICD-10-CM

## 2022-08-17 DIAGNOSIS — N401 Enlarged prostate with lower urinary tract symptoms: Secondary | ICD-10-CM

## 2022-08-17 DIAGNOSIS — Z8673 Personal history of transient ischemic attack (TIA), and cerebral infarction without residual deficits: Secondary | ICD-10-CM

## 2022-08-17 MED ORDER — ATORVASTATIN CALCIUM 80 MG PO TABS: 80.0000 mg | ORAL_TABLET | Freq: Every day | ORAL | 2 refills | Status: DC

## 2022-08-17 MED ORDER — AMLODIPINE BESYLATE 10 MG PO TABS: 10.0000 mg | ORAL_TABLET | Freq: Every day | ORAL | 2 refills | Status: DC

## 2022-08-17 MED ORDER — TAMSULOSIN HCL 0.4 MG PO CAPS: 0.4000 mg | ORAL_CAPSULE | Freq: Every day | ORAL | 1 refills | Status: DC

## 2022-08-17 MED ORDER — TETANUS-DIPHTH-ACELL PERTUSSIS 5-2-15.5 LF-MCG/0.5 IM SUSP
0.5000 mL | Freq: Once | INTRAMUSCULAR | 0 refills | Status: AC
  Filled 2022-08-18: qty 0.5, 1d supply, fill #0

## 2022-08-17 MED ORDER — VALSARTAN-HYDROCHLOROTHIAZIDE 320-25 MG PO TABS: 1.0000 | ORAL_TABLET | Freq: Every day | ORAL | 3 refills | Status: DC

## 2022-08-17 MED ORDER — EZETIMIBE 10 MG PO TABS: 10.0000 mg | ORAL_TABLET | Freq: Every day | ORAL | 2 refills | Status: DC

## 2022-08-17 NOTE — Assessment & Plan Note (Signed)
Reassess lipids continue fish oil Zetia and atorvastatin for stroke prevention continue aspirin as well

## 2022-08-17 NOTE — Assessment & Plan Note (Signed)
Urinating well continue tamsulosin

## 2022-08-17 NOTE — Assessment & Plan Note (Addendum)
No residual deficits this is a historically present but not current  Continue statin therapy and aspirin

## 2022-08-17 NOTE — Assessment & Plan Note (Signed)
Severe periodontal disease will likely increase stroke risk recurrence will refer to dentistry

## 2022-08-17 NOTE — Progress Notes (Deleted)
Complete physical exam  Patient: Paul [name]   DOB: 1961-02-07   61 y.o. Male  MRN: [id number]  Subjective:    Chief Complaint  Patient presents with   Hypertension    Paul [name] is a 61 y.o. male who presents today for a complete physical exam. He reports consuming a {diet types:17450} diet. {types:19826} He generally feels {DESC; WELL/FAIRLY WELL/POORLY:18703}. He reports sleeping {DESC; WELL/FAIRLY WELL/POORLY:18703}. He {does/does not:200015} have additional problems to discuss today.    Most recent fall risk assessment:    08/17/2022   11:00 AM  [place] in the past year? 0  Number falls in past yr: 0  Injury with Fall? 0  Follow up Falls evaluation completed     Most recent depression screenings:    08/17/2022   11:05 AM 03/06/2022    3:45 PM  PHQ 2/9 Scores  PHQ - 2 Score  3  PHQ- 9 Score  13  Exception Documentation Patient refusal     {VISON DENTAL STD PSA (Optional):27386}  {History (Optional):23778}  Patient Care Team: Paul Stain, MD as PCP - General (Pulmonary Disease)   Outpatient Medications Prior to Visit  Medication Sig   aspirin EC (ASPIRIN 81) 81 MG tablet Take 1 tablet (81 mg total) by mouth daily. Swallow whole.   Blood Pressure Monitoring (BLOOD PRESSURE KIT) DEVI Use to measure blood pressure   MAGNESIUM PO Take 1 tablet by mouth daily with breakfast.   Omega-3 Fatty Acids (FISH OIL PO) Take 1 capsule by mouth daily.   [DISCONTINUED] amLODipine (NORVASC) 10 MG tablet TAKE 1 TABLET BY MOUTH EVERY DAY   [DISCONTINUED] atorvastatin (LIPITOR) 80 MG tablet TAKE 1 TABLET BY MOUTH EVERY DAY   [DISCONTINUED] ezetimibe (ZETIA) 10 MG tablet TAKE 1 TABLET BY MOUTH EVERY DAY   [DISCONTINUED] potassium chloride (KLOR-CON) 10 MEQ tablet Take 1 tablet (10 mEq total) by mouth daily.   [DISCONTINUED] tamsulosin (FLOMAX) 0.4 MG CAPS capsule TAKE 1 CAPSULE BY MOUTH EVERY DAY   [DISCONTINUED] valsartan-hydrochlorothiazide (DIOVAN-HCT) 160-25 MG  tablet Take 1 tablet by mouth daily.   folic acid (FOLVITE) 1 MG tablet Take 1 tablet (1 mg total) by mouth daily. (Patient not taking: Reported on 08/17/2022)   thiamine (VITAMIN B1) 100 MG tablet Take 1 tablet (100 mg total) by mouth daily. (Patient not taking: Reported on 08/17/2022)   No facility-administered medications prior to visit.    ROS        Objective:     BP 137/88   Pulse 70   Ht _0  (1.626 m)   Wt 143 lb 9.6 oz (65.1 kg)   SpO2 98%   BMI 24.65 kg/m  {Vitals History (Optional):23777}  Physical Exam   No results found for any visits on 08/17/22. {Show previous labs (optional):23779}    Assessment & Plan:    Routine Health Maintenance and Physical Exam  Immunization History  Administered Date(s) Administered   Influenza,inj,Quad PF,6+ Mos 08/26/2020   Pneumococcal Polysaccharide-23 08/26/2020    Health Maintenance  Topic Date Due   COVID-19 Vaccine (1) Never done   DTaP/Tdap/Td (1 - Tdap) Never done   Zoster Vaccines- Shingrix (1 of 2) Never done   INFLUENZA VACCINE  12/17/2022 (Originally 04/18/2022)   COLONOSCOPY (Pts 45-83yr Insurance coverage will need to be confirmed)  01/30/2026   Hepatitis C Screening  Completed   HIV Screening  Completed   HPV VACCINES  Aged Out    Discussed health benefits of physical activity,  and encouraged him to engage in regular exercise appropriate for his age and condition.  Problem List Items Addressed This Visit       Cardiovascular and Mediastinum   HTN (hypertension) - Primary    Not yet at goal plan is to add carvedilol 25 mg twice daily and increase valsartan HCT to 320/25 and continue amlodipine 10 mg a day check metabolic panel and see patient back short-term      Relevant Medications   valsartan-hydrochlorothiazide (DIOVAN-HCT) 320-25 MG tablet   amLODipine (NORVASC) 10 MG tablet   atorvastatin (LIPITOR) 80 MG tablet   ezetimibe (ZETIA) 10 MG tablet   Other Relevant Orders   Comprehensive  metabolic panel     Digestive   Periodontal disease    Severe periodontal disease will likely increase stroke risk recurrence will refer to dentistry        Genitourinary   BPH (benign prostatic hyperplasia)    Urinating well continue tamsulosin      Relevant Medications   tamsulosin (FLOMAX) 0.4 MG CAPS capsule     Other   History of alcohol use    Not currently using alcohol      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    No residual deficits this is a historically present but not current  Continue statin therapy and aspirin      Hyperlipidemia    Reassess lipids continue fish oil Zetia and atorvastatin for stroke prevention continue aspirin as well      Relevant Medications   valsartan-hydrochlorothiazide (DIOVAN-HCT) 320-25 MG tablet   amLODipine (NORVASC) 10 MG tablet   atorvastatin (LIPITOR) 80 MG tablet   ezetimibe (ZETIA) 10 MG tablet   Other Relevant Orders   Lipid panel   Hypokalemia    Reassess potassium      Other Visit Diagnoses     History of CVA in adulthood       Relevant Medications   atorvastatin (LIPITOR) 80 MG tablet       Return in about 2 months (around 10/17/2022) for htn.     Asencion Noble, MD

## 2022-08-17 NOTE — Assessment & Plan Note (Signed)
Not currently using alcohol 

## 2022-08-17 NOTE — Patient Instructions (Signed)
Tetanus vaccine will be given down to the pharmacy downstairs under patient assistance this way will be free to you  Please get your teeth cleaned we gave you dental resources  Increase valsartan HCT to 320/25 daily this prescription was sent to your pharmacy  Labs today include lipid panel metabolic panel  Return to Dr. Delford Field for blood pressure follow-up in 2 months

## 2022-08-17 NOTE — Progress Notes (Deleted)
Tooth ache

## 2022-08-17 NOTE — Assessment & Plan Note (Signed)
Not yet at goal plan is to add carvedilol 25 mg twice daily and increase valsartan HCT to 320/25 and continue amlodipine 10 mg a day check metabolic panel and see patient back short-term

## 2022-08-17 NOTE — Progress Notes (Signed)
Subjective   Patient ID: Paul [name], male    DOB: 08/12/1961  Age: 61 y.o. MRN: [id number]  Chief Complaint  Patient presents with   Hypertension   04/2022 Paul EldersSuan Wexler is a 61 year old male with a history of cerebrovascular accident x2 (2021/2023), hypertension, benign prostatic hyperplasia, and chronic diarrhea presents to the office today for transfer of care following recent hospitalization. During his hospitalization, MRI revealed right periatrial matter with small evolving subacute vessel infarct. He was released from the hospital on June 5 after returning to baseline.  Today, Paul [name] is in good spirits after his hospitalization. He continues to struggle with chronic diarrhea and urinary frequency.  Otherwise, No acute concerns. Denies tobacco and substance abuse. Admits to consuming alcohol socially.  Basic Labs and Urinalysis with culture drawn today including: complete blood count, complete metabolic count, and Hepatitis C screening. Tetanus vaccine not given today due to office staff availability. Radiology closed at this time and patient instructed to follow up for chest x ray.  11/30 Patient seen in return follow-up other than having some dental pain for the past month he seems to be doing better on arrival however blood pressure is slightly elevated 137/88 I rechecked it and it is in this range she also has 140 250/80-90 at home with his home machine.  He is on valsartan HCT 160/25 daily and amlodipine 10 mg daily.  He tries to follow a healthy diet as well.  He has no other complaints.  The diarrhea syndrome he had in the past is now resolved.  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  - History of CVA in 2021 on daily aspirin, now new ischemic infarct seen on MRI at the right periatrial matter with small evolving subacute small vessel infarct, CTA head and neck negative for large vessel occlusion, stroke likely incidental, 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 Pearlean BrownieSethi and PT, outpatient vestibular rehab will be arranged by case management.  Currently close to his baseline will be discharged home with outpatient PCP and neurology follow-up.   Chronic diarrhea Month-long diarrhea worse with food.  Currently stable question if he is developing pancreatic insufficiency from alcohol use, recommend follow-up with GI will be arranged by PCP.   Alcohol use Reports 2-3 beers nightly. -Currently no signs of DTs, strictly counseled to quit alcohol.   HTN (hypertension) Appears to have been at goal in the past.  Allow for permissive hypertension, blood pressure medications adjusted as below, of note patient is noncompliant with his blood pressure medications PCP to monitor closely.   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  PCP to monitor A1c 6.3.   Dyslipidemia.  Noncompliant with Lipitor, counseled, resume home dose Lipitor, also Zetia added.  05/10/22 Patient returns in follow-up post stroke on  arrival blood pressure elevated 165/91 despite taking amlodipine.  Patient tends to only eat dinner consists of mainly white rice and eggplant.  He skips breakfast and lunch.  He is no longer drinking alcohol.  Note he has been out of work since June 3 of this summer.  He is able to return to work on August 28.  He had  paperwork regarding his FMLA need to be filled out by his company and we need to address this.  Patient is yet to have neurology follow-up and is yet to receive outpatient physical therapy for his stroke.  The hospital was to set this up but it did not occur.  Patient has occasional dizziness occasional headache but balance is improved which was findings when he had his stroke.  Patient does maintain fish oil Zetia and high-dose atorvastatin for cholesterol management.  Patient is maintaining thiamine and folic acid as well.  Patient is on tamsulosin low-dose and is this results in improved urine output. Gastrointestinal symptoms have resolved Patient Active Problem List   Diagnosis Date Noted   Periodontal disease 08/17/2022   Hypokalemia 06/09/2022   Hyperlipidemia 03/07/2022   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 02/18/2022   HTN (hypertension) 08/25/2020   BPH (benign prostatic hyperplasia) 08/25/2020   History of tobacco use 08/25/2020   History of alcohol use 08/25/2020   Past Medical History:  Diagnosis Date   Acute CVA (cerebrovascular accident) (HCC) 08/25/2020   Hypertension    Stroke (HCC)    Urinary frequency    Past Surgical History:  Procedure Laterality Date   COLONOSCOPY WITH PROPOFOL N/A 01/31/2016   Procedure: COLONOSCOPY WITH PROPOFOL;  Surgeon: Charolett Bumpers, MD;  Location: [facility];  Service: Endoscopy;  Laterality: N/A;   NO PAST SURGERIES     Social History   Tobacco Use   Smoking status: Former    Packs/day: 0.50    Years: 30.00    Total pack years: 15.00    Types: Cigarettes    Quit date: 08/24/2020    Years since quitting: 1.9   Smokeless tobacco: Never  Vaping Use   Vaping Use: Never used  Substance Use Topics   Alcohol use: Yes    Comment: 1-2 beers most days   Drug use: No   Social History   Socioeconomic History   Marital status: Single    Spouse name: Not on file   Number of children: Not on file   Years of  education: Not on file   Highest education level: Not on file  Occupational History   Not on file  Tobacco Use   Smoking status: Former    Packs/day: 0.50    Years: 30.00    Total pack years: 15.00    Types: Cigarettes    Quit date: 08/24/2020    Years since quitting: 1.9   Smokeless tobacco: Never  Vaping Use   Vaping Use: Never used  Substance and Sexual Activity   Alcohol use: Yes    Comment: 1-2 beers most days   Drug use: No   Sexual activity: Not on file  Other Topics Concern   Not on file  Social History Narrative   Not on file   Social Determinants of Health   Financial Resource Strain: Not on file  Food Insecurity: Not on file  Transportation Needs: Not on file  Physical Activity: Not on file  Stress: Not on file  Social Connections: Not on file  Intimate Partner Violence: Not on  file   No family status information on file.   History reviewed. No pertinent family history. No Known Allergies  Review of Systems  Constitutional:  Negative for chills, diaphoresis, fever, malaise/fatigue and weight loss.  HENT:  Negative for congestion, hearing loss, nosebleeds, sore throat and tinnitus.        Dental pain  Eyes:  Negative for blurred vision, photophobia and redness.  Respiratory: Negative.  Negative for cough, hemoptysis, sputum production, shortness of breath, wheezing and stridor.   Cardiovascular:  Negative for chest pain, palpitations, orthopnea, claudication, leg swelling and PND.  Gastrointestinal:  Negative for abdominal pain, blood in stool, constipation, diarrhea, heartburn, nausea and vomiting.  Genitourinary:  Negative for dysuria, flank pain, frequency, hematuria and urgency.  Musculoskeletal:  Negative for back pain, falls, joint pain, myalgias and neck pain.  Skin:  Negative for itching and rash.  Neurological:  Negative for dizziness, tingling, tremors, sensory change, speech change, focal weakness, seizures, loss of consciousness, weakness and  headaches.  Endo/Heme/Allergies:  Negative for environmental allergies and polydipsia. Does not bruise/bleed easily.  Psychiatric/Behavioral:  Negative for depression, memory loss, substance abuse and suicidal ideas. The patient is not nervous/anxious and does not have insomnia.       Objective:     BP 137/88   Pulse 70   Ht 5\' 4"  (1.626 m)   Wt 143 lb 9.6 oz (65.1 kg)   SpO2 98%   BMI 24.65 kg/m  BP Readings from Last 3 Encounters:  08/17/22 137/88  06/08/22 107/67  05/19/22 (!) 142/88   Wt Readings from Last 3 Encounters:  08/17/22 143 lb 9.6 oz (65.1 kg)  05/10/22 149 lb 9.6 oz (67.9 kg)  03/06/22 145 lb (65.8 kg)      Physical Exam Vitals reviewed.  Constitutional:      Appearance: Normal appearance. He is well-developed and normal weight. He is not diaphoretic.  HENT:     Head: Normocephalic and atraumatic.     Right Ear: Tympanic membrane, ear canal and external ear normal.     Left Ear: Ear canal and external ear normal.     Nose: No nasal deformity, septal deviation, mucosal edema or rhinorrhea.     Right Sinus: No maxillary sinus tenderness or frontal sinus tenderness.     Left Sinus: No maxillary sinus tenderness or frontal sinus tenderness.     Mouth/Throat:     Mouth: Mucous membranes are moist.     Pharynx: Oropharynx is clear. No oropharyngeal exudate.     Comments: Poor dentition severe periodontal disease Eyes:     General: No scleral icterus.    Extraocular Movements: Extraocular movements intact.     Conjunctiva/sclera: Conjunctivae normal.     Pupils: Pupils are equal, round, and reactive to light.     Comments: Left tympanic membrane perforation  Neck:     Thyroid: No thyromegaly.     Vascular: No carotid bruit or JVD.     Trachea: Trachea normal. No tracheal tenderness or tracheal deviation.  Cardiovascular:     Rate and Rhythm: Normal rate and regular rhythm.     Chest Wall: PMI is not displaced.     Pulses: Normal pulses. No decreased  pulses.     Heart sounds: Normal heart sounds, S1 normal and S2 normal. Heart sounds not distant. No murmur heard.    No systolic murmur is present.     No diastolic murmur is present.     No friction rub. No gallop. No S3  or S4 sounds.  Pulmonary:     Effort: Pulmonary effort is normal. No tachypnea, accessory muscle usage or respiratory distress.     Breath sounds: Normal breath sounds. No stridor. No decreased breath sounds, wheezing, rhonchi or rales.  Chest:     Chest wall: No tenderness.  Abdominal:     General: Abdomen is flat. Bowel sounds are increased. There is no distension.     Palpations: Abdomen is soft. Abdomen is not rigid.     Tenderness: There is no abdominal tenderness. There is no guarding or rebound.  Genitourinary:    Prostate: Enlarged.  Musculoskeletal:        General: Normal range of motion.     Cervical back: Normal range of motion and neck supple. No edema, erythema or rigidity. No muscular tenderness. Normal range of motion.  Lymphadenopathy:     Head:     Right side of head: No submental or submandibular adenopathy.     Left side of head: No submental or submandibular adenopathy.     Cervical: No cervical adenopathy.  Skin:    General: Skin is warm and dry.     Coloration: Skin is not pale.     Findings: No rash.     Nails: There is no clubbing.  Neurological:     Mental Status: He is alert and oriented to person, place, and time.     Sensory: No sensory deficit.  Psychiatric:        Mood and Affect: Mood normal.        Speech: Speech normal.        Behavior: Behavior normal.    2017 COLONOSCOPY PER DR Danise Edge: Impression:               - The entire examined colon is normal.                           - No specimens collected.   No results found for any visits on 08/17/22.   Last CBC Lab Results  Component Value Date   WBC 7.2 03/06/2022   HGB 14.4 03/06/2022   HCT 45.5 03/06/2022   MCV 75 (L) 03/06/2022   MCH 23.6 (L) 03/06/2022    RDW 15.1 03/06/2022   PLT 240 03/06/2022   Last metabolic panel Lab Results  Component Value Date   GLUCOSE 243 (H) 06/08/2022   NA 138 06/08/2022   K 3.2 (L) 06/08/2022   CL 99 06/08/2022   CO2 26 06/08/2022   BUN 20 06/08/2022   CREATININE 1.25 06/08/2022   GFRNONAA >60 02/18/2022   CALCIUM 9.0 06/08/2022   PHOS 3.1 08/25/2020   PROT 7.4 06/08/2022   ALBUMIN 4.6 06/08/2022   LABGLOB 2.8 06/08/2022   AGRATIO 1.6 06/08/2022   BILITOT 0.4 06/08/2022   ALKPHOS 103 06/08/2022   AST 35 06/08/2022   ALT 48 (H) 06/08/2022   ANIONGAP 8 02/18/2022   Last lipids Lab Results  Component Value Date   CHOL 183 02/19/2022   HDL 51 02/19/2022   LDLCALC 117 (H) 02/19/2022   TRIG 77 02/19/2022   CHOLHDL 3.6 02/19/2022   Last hemoglobin A1c Lab Results  Component Value Date   HGBA1C 6.3 (H) 02/18/2022   Last thyroid functions Lab Results  Component Value Date   TSH 1.407 02/19/2022   Last vitamin D No results found for: "25OHVITD2", "25OHVITD3", "VD25OH" Last vitamin B12 and Folate No results found for: "VITAMINB12", "FOLATE"  The ASCVD Risk score (Arnett DK, et al., 2019) failed to calculate for the following reasons:   The patient has a prior MI or stroke diagnosis    Assessment & Plan:   Problem List Items Addressed This Visit       Cardiovascular and Mediastinum   HTN (hypertension)    Not yet at goal plan is to add carvedilol 25 mg twice daily and increase valsartan HCT to 320/25 and continue amlodipine 10 mg a day check metabolic panel and see patient back short-term      Relevant Medications   valsartan-hydrochlorothiazide (DIOVAN-HCT) 320-25 MG tablet   amLODipine (NORVASC) 10 MG tablet   atorvastatin (LIPITOR) 80 MG tablet   ezetimibe (ZETIA) 10 MG tablet   Other Relevant Orders   Comprehensive metabolic panel     Digestive   Periodontal disease    Severe periodontal disease will likely increase stroke risk recurrence will refer to dentistry         Genitourinary   BPH (benign prostatic hyperplasia)    Urinating well continue tamsulosin      Relevant Medications   tamsulosin (FLOMAX) 0.4 MG CAPS capsule     Other   History of alcohol use    Not currently using alcohol      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 - Primary    No residual deficits this is a historically present but not current  Continue statin therapy and aspirin      Hyperlipidemia    Reassess lipids continue fish oil Zetia and atorvastatin for stroke prevention continue aspirin as well      Relevant Medications   valsartan-hydrochlorothiazide (DIOVAN-HCT) 320-25 MG tablet   amLODipine (NORVASC) 10 MG tablet   atorvastatin (LIPITOR) 80 MG tablet   ezetimibe (ZETIA) 10 MG tablet   Other Relevant Orders   Lipid panel   Hypokalemia    Reassess potassium      Other Visit Diagnoses     History of CVA in adulthood       Relevant Medications   atorvastatin (LIPITOR) 80 MG tablet     Tetanus shot given  Return in about 2 months (around 10/17/2022) for htn.    Shan Levans, MD

## 2022-08-17 NOTE — Progress Notes (Deleted)
Complete physical exam  Patient: Paul [name]   DOB: 04/07/1961   61 y.o. Male  MRN: [id number]  Subjective:    Chief Complaint  Patient presents with   Hypertension    Mikie Misner is a 61 y.o. male who presents today for a complete physical exam. He reports consuming a {diet types:17450} diet. {types:19826} He generally feels {DESC; WELL/FAIRLY WELL/POORLY:18703}. He reports sleeping {DESC; WELL/FAIRLY WELL/POORLY:18703}. He {does/does not:200015} have additional problems to discuss today.    Most recent fall risk assessment:    08/17/2022   11:00 AM  [place] in the past year? 0  Number falls in past yr: 0  Injury with Fall? 0  Follow up Falls evaluation completed     Most recent depression screenings:    08/17/2022   11:05 AM 03/06/2022    3:45 PM  PHQ 2/9 Scores  PHQ - 2 Score  3  PHQ- 9 Score  13  Exception Documentation Patient refusal     {VISON DENTAL STD PSA (Optional):27386}  {History (Optional):23778}  Patient Care Team: Elsie Stain, MD as PCP - General (Pulmonary Disease)   Outpatient Medications Prior to Visit  Medication Sig   aspirin EC (ASPIRIN 81) 81 MG tablet Take 1 tablet (81 mg total) by mouth daily. Swallow whole.   Blood Pressure Monitoring (BLOOD PRESSURE KIT) DEVI Use to measure blood pressure   MAGNESIUM PO Take 1 tablet by mouth daily with breakfast.   Omega-3 Fatty Acids (FISH OIL PO) Take 1 capsule by mouth daily.   [DISCONTINUED] amLODipine (NORVASC) 10 MG tablet TAKE 1 TABLET BY MOUTH EVERY DAY   [DISCONTINUED] atorvastatin (LIPITOR) 80 MG tablet TAKE 1 TABLET BY MOUTH EVERY DAY   [DISCONTINUED] ezetimibe (ZETIA) 10 MG tablet TAKE 1 TABLET BY MOUTH EVERY DAY   [DISCONTINUED] potassium chloride (KLOR-CON) 10 MEQ tablet Take 1 tablet (10 mEq total) by mouth daily.   [DISCONTINUED] tamsulosin (FLOMAX) 0.4 MG CAPS capsule TAKE 1 CAPSULE BY MOUTH EVERY DAY   [DISCONTINUED] valsartan-hydrochlorothiazide (DIOVAN-HCT) 160-25 MG  tablet Take 1 tablet by mouth daily.   folic acid (FOLVITE) 1 MG tablet Take 1 tablet (1 mg total) by mouth daily. (Patient not taking: Reported on 08/17/2022)   thiamine (VITAMIN B1) 100 MG tablet Take 1 tablet (100 mg total) by mouth daily. (Patient not taking: Reported on 08/17/2022)   No facility-administered medications prior to visit.    ROS        Objective:     BP 137/88   Pulse 70   Ht _0  (1.626 m)   Wt 143 lb 9.6 oz (65.1 kg)   SpO2 98%   BMI 24.65 kg/m  {Vitals History (Optional):23777}  Physical Exam   No results found for any visits on 08/17/22. {Show previous labs (optional):23779}    Assessment & Plan:    Routine Health Maintenance and Physical Exam  Immunization History  Administered Date(s) Administered   Influenza,inj,Quad PF,6+ Mos 08/26/2020   Pneumococcal Polysaccharide-23 08/26/2020    Health Maintenance  Topic Date Due   COVID-19 Vaccine (1) Never done   DTaP/Tdap/Td (1 - Tdap) Never done   Zoster Vaccines- Shingrix (1 of 2) Never done   INFLUENZA VACCINE  12/17/2022 (Originally 04/18/2022)   COLONOSCOPY (Pts 45-51yr Insurance coverage will need to be confirmed)  01/30/2026   Hepatitis C Screening  Completed   HIV Screening  Completed   HPV VACCINES  Aged Out    Discussed health benefits of physical activity,  and encouraged him to engage in regular exercise appropriate for his age and condition.  Problem List Items Addressed This Visit       Cardiovascular and Mediastinum   HTN (hypertension) - Primary    Not yet at goal plan is to add carvedilol 25 mg twice daily and increase valsartan HCT to 320/25 and continue amlodipine 10 mg a day check metabolic panel and see patient back short-term      Relevant Medications   valsartan-hydrochlorothiazide (DIOVAN-HCT) 320-25 MG tablet   amLODipine (NORVASC) 10 MG tablet   atorvastatin (LIPITOR) 80 MG tablet   ezetimibe (ZETIA) 10 MG tablet   Other Relevant Orders   Comprehensive  metabolic panel     Digestive   Periodontal disease    Severe periodontal disease will likely increase stroke risk recurrence will refer to dentistry        Genitourinary   BPH (benign prostatic hyperplasia)    Urinating well continue tamsulosin      Relevant Medications   tamsulosin (FLOMAX) 0.4 MG CAPS capsule     Other   History of alcohol use    Not currently using alcohol      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    No residual deficits this is a historically present but not current  Continue statin therapy and aspirin      Hyperlipidemia    Reassess lipids continue fish oil Zetia and atorvastatin for stroke prevention continue aspirin as well      Relevant Medications   valsartan-hydrochlorothiazide (DIOVAN-HCT) 320-25 MG tablet   amLODipine (NORVASC) 10 MG tablet   atorvastatin (LIPITOR) 80 MG tablet   ezetimibe (ZETIA) 10 MG tablet   Other Relevant Orders   Lipid panel   Hypokalemia    Reassess potassium      Other Visit Diagnoses     History of CVA in adulthood       Relevant Medications   atorvastatin (LIPITOR) 80 MG tablet      Return in about 2 months (around 10/17/2022) for htn.     Asencion Noble, MD

## 2022-08-17 NOTE — Assessment & Plan Note (Signed)
Reassess potassium

## 2022-08-18 ENCOUNTER — Other Ambulatory Visit: Payer: Self-pay

## 2022-08-18 ENCOUNTER — Telehealth: Payer: Self-pay

## 2022-08-18 LAB — COMPREHENSIVE METABOLIC PANEL
ALT: 49 IU/L — ABNORMAL HIGH (ref 0–44)
AST: 35 IU/L (ref 0–40)
Albumin/Globulin Ratio: 1.3 (ref 1.2–2.2)
Albumin: 4.4 g/dL (ref 3.9–4.9)
Alkaline Phosphatase: 95 IU/L (ref 44–121)
BUN/Creatinine Ratio: 19 (ref 10–24)
BUN: 17 mg/dL (ref 8–27)
Bilirubin Total: 0.3 mg/dL (ref 0.0–1.2)
CO2: 25 mmol/L (ref 20–29)
Calcium: 9.5 mg/dL (ref 8.6–10.2)
Chloride: 101 mmol/L (ref 96–106)
Creatinine, Ser: 0.9 mg/dL (ref 0.76–1.27)
Globulin, Total: 3.3 g/dL (ref 1.5–4.5)
Glucose: 119 mg/dL — ABNORMAL HIGH (ref 70–99)
Potassium: 3.5 mmol/L (ref 3.5–5.2)
Sodium: 142 mmol/L (ref 134–144)
Total Protein: 7.7 g/dL (ref 6.0–8.5)
eGFR: 96 mL/min/{1.73_m2} (ref >59)

## 2022-08-18 LAB — LIPID PANEL
Chol/HDL Ratio: 2.7 ratio (ref 0.0–5.0)
Cholesterol, Total: 120 mg/dL (ref 100–199)
HDL: 45 mg/dL (ref >39)
LDL Chol Calc (NIH): 57 mg/dL (ref 0–99)
Triglycerides: 93 mg/dL (ref 0–149)
VLDL Cholesterol Cal: 18 mg/dL (ref 5–40)

## 2022-08-18 NOTE — Progress Notes (Signed)
Let pt know cholesterol is at goal kidney liver normal no changes in medications

## 2022-08-18 NOTE — Telephone Encounter (Signed)
-----   Message from Patrick E Wright, MD sent at 08/18/2022  5:50 AM EST ----- Let pt know cholesterol is at goal kidney liver normal no changes in medications 

## 2022-08-18 NOTE — Telephone Encounter (Signed)
Pt was called and no vm was left due to mailbox being full. Information has been sent to nurse pool.  

## 2022-08-18 NOTE — Telephone Encounter (Signed)
-----   Message from Storm Frisk, MD sent at 08/18/2022  5:50 AM EST ----- Let pt know cholesterol is at goal kidney liver normal no changes in medications

## 2022-08-18 NOTE — Telephone Encounter (Signed)
Patient called back this morning  and was given results   No further question or concerns

## 2022-10-03 ENCOUNTER — Other Ambulatory Visit: Payer: Self-pay

## 2022-10-04 ENCOUNTER — Other Ambulatory Visit: Payer: Self-pay | Admitting: Critical Care Medicine

## 2022-10-04 DIAGNOSIS — I10 Essential (primary) hypertension: Secondary | ICD-10-CM

## 2022-10-26 ENCOUNTER — Ambulatory Visit: Payer: Self-pay | Attending: Critical Care Medicine | Admitting: Critical Care Medicine

## 2022-10-26 ENCOUNTER — Encounter: Payer: Self-pay | Admitting: Critical Care Medicine

## 2022-10-26 VITALS — BP 125/80 | HR 64 | Wt 145.2 lb

## 2022-10-26 DIAGNOSIS — I8311 Varicose veins of right lower extremity with inflammation: Secondary | ICD-10-CM | POA: Insufficient documentation

## 2022-10-26 DIAGNOSIS — E876 Hypokalemia: Secondary | ICD-10-CM

## 2022-10-26 DIAGNOSIS — Z87898 Personal history of other specified conditions: Secondary | ICD-10-CM

## 2022-10-26 DIAGNOSIS — K056 Periodontal disease, unspecified: Secondary | ICD-10-CM

## 2022-10-26 DIAGNOSIS — M7989 Other specified soft tissue disorders: Secondary | ICD-10-CM | POA: Insufficient documentation

## 2022-10-26 DIAGNOSIS — N401 Enlarged prostate with lower urinary tract symptoms: Secondary | ICD-10-CM

## 2022-10-26 DIAGNOSIS — Z8673 Personal history of transient ischemic attack (TIA), and cerebral infarction without residual deficits: Secondary | ICD-10-CM

## 2022-10-26 DIAGNOSIS — E782 Mixed hyperlipidemia: Secondary | ICD-10-CM

## 2022-10-26 DIAGNOSIS — I10 Essential (primary) hypertension: Secondary | ICD-10-CM

## 2022-10-26 MED ORDER — LIDOCAINE 5 % EX PTCH: 1.0000 | MEDICATED_PATCH | CUTANEOUS | 0 refills | Status: DC

## 2022-10-26 MED ORDER — VALSARTAN-HYDROCHLOROTHIAZIDE 320-25 MG PO TABS: 1.0000 | ORAL_TABLET | Freq: Every day | ORAL | 3 refills | Status: DC

## 2022-10-26 MED ORDER — DICLOFENAC SODIUM 1 % EX GEL: 2.0000 g | Freq: Four times a day (QID) | CUTANEOUS | 1 refills | Status: DC

## 2022-10-26 MED ORDER — EZETIMIBE 10 MG PO TABS: 10.0000 mg | ORAL_TABLET | Freq: Every day | ORAL | 2 refills | Status: DC

## 2022-10-26 MED ORDER — AMLODIPINE BESYLATE 10 MG PO TABS: 10.0000 mg | ORAL_TABLET | Freq: Every day | ORAL | 1 refills | Status: DC

## 2022-10-26 MED ORDER — TAMSULOSIN HCL 0.4 MG PO CAPS: 0.4000 mg | ORAL_CAPSULE | Freq: Every day | ORAL | 1 refills | Status: DC

## 2022-10-26 MED ORDER — ATORVASTATIN CALCIUM 80 MG PO TABS: 80.0000 mg | ORAL_TABLET | Freq: Every day | ORAL | 2 refills | Status: DC

## 2022-10-26 NOTE — Assessment & Plan Note (Signed)
Need to rule out DVT ultrasound ordered

## 2022-10-26 NOTE — Assessment & Plan Note (Signed)
Hypertension controlled continue current medications

## 2022-10-26 NOTE — Assessment & Plan Note (Signed)
Continue aspirin and high-dose  cholesterol therapy

## 2022-10-26 NOTE — Assessment & Plan Note (Signed)
Continue Zetia and atorvastatin high-dose

## 2022-10-26 NOTE — Assessment & Plan Note (Signed)
No longer drinking alcohol

## 2022-10-26 NOTE — Assessment & Plan Note (Signed)
Encouraged to keep upcoming appointment with dental

## 2022-10-26 NOTE — Progress Notes (Signed)
Subjective   Patient ID: Paul [name], male    DOB: 09/08/1961  Age: 62 y.o. MRN: [id number]  Chief Complaint  Patient presents with   dry mouth   Back Pain   Leg Swelling   04/2022 Paul [name] is a 62 year old male with a history of cerebrovascular accident x2 (2021/2023), hypertension, benign prostatic hyperplasia, and chronic diarrhea presents to the office today for transfer of care following recent hospitalization. During his hospitalization, MRI revealed right periatrial matter with small evolving subacute vessel infarct. He was released from the hospital on June 5 after returning to baseline.  Today, Paul [name] is in good spirits after his hospitalization. He continues to struggle with chronic diarrhea and urinary frequency.  Otherwise, No acute concerns. Denies tobacco and substance abuse. Admits to consuming alcohol socially.  Basic Labs and Urinalysis with culture drawn today including: complete blood count, complete metabolic count, and Hepatitis C screening. Tetanus vaccine not given today due to office staff availability. Radiology closed at this time and patient instructed to follow up for chest x ray.  11/30 Patient seen in return follow-up other than having some dental pain for the past month he seems to be doing better on arrival however blood pressure is slightly elevated 137/88 I rechecked it and it is in this range she also has 140 250/80-90 at home with his home machine.  He is on valsartan HCT 160/25 daily and amlodipine 10 mg daily.  He tries to follow a healthy diet as well.  He has no other complaints.  The diarrhea syndrome he had in the past is now resolved.  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  Blood pressure on arrival 125/80.  He is compliant with all his medications at this time.  He is taking the valsartan HCT and amlodipine.   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 Leonie Man and PT, outpatient vestibular rehab will be arranged by case management.  Currently close to his baseline will be discharged home with outpatient PCP and neurology follow-up.   Chronic diarrhea Month-long diarrhea worse with food.  Currently stable question if he is developing pancreatic insufficiency from alcohol use, recommend follow-up with GI will be arranged by PCP.   Alcohol use Reports 2-3 beers nightly. -Currently no  signs of DTs, strictly counseled to quit alcohol.   HTN (hypertension) Appears to have been at goal in the past.  Allow for permissive hypertension, blood pressure medications adjusted as below, of note patient is noncompliant with his blood pressure medications PCP to monitor closely.   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  PCP to monitor A1c 6.3.   Dyslipidemia.  Noncompliant with Lipitor, counseled, resume home dose  Lipitor, also Zetia added.  05/10/22 Patient returns in follow-up post stroke on arrival blood pressure elevated 165/91 despite taking amlodipine.  Patient tends to only eat dinner consists of mainly white rice and eggplant.  He skips breakfast and lunch.  He is no longer drinking alcohol.  Note he has been out of work since June 3 of this summer.  He is able to return to work on August 28.  He had paperwork regarding his FMLA need to be filled out by his company and we need to address this.  Patient is yet to have neurology follow-up and is yet to receive outpatient physical therapy for his stroke.  The hospital was to set this up but it did not occur.  Patient has occasional dizziness occasional headache but balance is improved which was findings when he had his stroke.  Patient does maintain fish oil Zetia and high-dose atorvastatin for cholesterol management.  Patient is maintaining thiamine and folic acid as well.  Patient is on tamsulosin low-dose and is this results in improved urine output. Gastrointestinal symptoms have resolved   Patient Active Problem List   Diagnosis Date Noted   Varicose veins of right lower extremity with inflammation 10/26/2022   Right leg swelling 10/26/2022   Periodontal disease 08/17/2022   Hyperlipidemia 03/07/2022   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 02/18/2022   HTN (hypertension) 08/25/2020   BPH (benign prostatic hyperplasia) 08/25/2020   History of tobacco use 08/25/2020   History of alcohol use 08/25/2020   Past Medical History:  Diagnosis Date   Acute CVA (cerebrovascular accident) ([place]) 08/25/2020   Hypertension    Stroke ([place])    Urinary frequency    Past Surgical History:  Procedure Laterality Date   COLONOSCOPY WITH PROPOFOL N/A 01/31/2016   Procedure: COLONOSCOPY WITH PROPOFOL;  Surgeon: Garlan Fair, MD;  Location: [facility];  Service: Endoscopy;  Laterality: N/A;   NO PAST SURGERIES     Social History    Tobacco Use   Smoking status: Former    Packs/day: 0.50    Years: 30.00    Total pack years: 15.00    Types: Cigarettes    Quit date: 08/24/2020    Years since quitting: 2.1   Smokeless tobacco: Never  Vaping Use   Vaping Use: Never used  Substance Use Topics   Alcohol use: Yes    Comment: 1-2 beers most days   Drug use: No   Social History   Socioeconomic History   Marital status: Single    Spouse name: Not on file   Number of children: Not on file   Years of education: Not on file   Highest education level: Not on file  Occupational History   Not on file  Tobacco Use   Smoking status: Former    Packs/day: 0.50    Years: 30.00    Total pack years: 15.00    Types: Cigarettes    Quit date: 08/24/2020    Years since quitting: 2.1   Smokeless tobacco: Never  Vaping Use   Vaping Use: Never used  Substance and Sexual Activity   Alcohol use: Yes    Comment: 1-2 beers most days   Drug use: No   Sexual activity: Not on file  Other Topics Concern   Not on file  Social History Narrative   Not on file   Social Determinants of Health   Financial Resource Strain: Not on file  Food Insecurity: Not on file  Transportation Needs: Not on file  Physical Activity: Not on file  Stress: Not on file  Social Connections: Not on file  Intimate Partner Violence: Not on file   No family status information on file.   History reviewed. No pertinent family history. No Known Allergies  Review of Systems  Constitutional:  Negative for chills, diaphoresis, fever, malaise/fatigue and weight loss.  HENT:  Negative for congestion, hearing loss, nosebleeds, sore throat and tinnitus.        Dental pain  Eyes:  Negative for blurred vision, photophobia and redness.  Respiratory: Negative.  Negative for cough, hemoptysis, sputum production, shortness of breath, wheezing and stridor.   Cardiovascular:  Positive for leg swelling. Negative for chest pain, palpitations, orthopnea,  claudication and PND.  Gastrointestinal:  Negative for abdominal pain, blood in stool, constipation, diarrhea, heartburn, nausea and vomiting.  Genitourinary:  Negative for dysuria, flank pain, frequency, hematuria and urgency.  Musculoskeletal:  Negative for back pain, falls, joint pain, myalgias and neck pain.  Skin:  Negative for itching and rash.  Neurological:  Negative for dizziness, tingling, tremors, sensory change, speech change, focal weakness, seizures, loss of consciousness, weakness and headaches.  Endo/Heme/Allergies:  Negative for environmental allergies and polydipsia. Does not bruise/bleed easily.  Psychiatric/Behavioral:  Negative for depression, memory loss, substance abuse and suicidal ideas. The patient is not nervous/anxious and does not have insomnia.       Objective:     BP 125/80   Pulse 64   Wt 145 lb 3.2 oz (65.9 kg)   SpO2 99%   BMI 24.92 kg/m  BP Readings from Last 3 Encounters:  10/26/22 125/80  08/17/22 137/88  06/08/22 107/67   Wt Readings from Last 3 Encounters:  10/26/22 145 lb 3.2 oz (65.9 kg)  08/17/22 143 lb 9.6 oz (65.1 kg)  05/10/22 149 lb 9.6 oz (67.9 kg)      Physical Exam Vitals reviewed.  Constitutional:      Appearance: Normal appearance. He is well-developed and normal weight. He is not diaphoretic.  HENT:     Head: Normocephalic and atraumatic.     Right Ear: Tympanic membrane, ear canal and external ear normal.     Left Ear: Ear canal and external ear normal.     Nose: No nasal deformity, septal deviation, mucosal edema or rhinorrhea.     Right Sinus: No maxillary sinus tenderness or frontal sinus tenderness.     Left Sinus: No maxillary sinus tenderness or frontal sinus tenderness.     Mouth/Throat:     Mouth: Mucous membranes are moist.     Pharynx: Oropharynx is clear. No oropharyngeal exudate.     Comments: Poor dentition severe periodontal disease Eyes:     General: No scleral icterus.    Extraocular Movements:  Extraocular movements intact.     Conjunctiva/sclera: Conjunctivae normal.     Pupils: Pupils are equal, round, and reactive to light.     Comments: Left tympanic membrane perforation  Neck:     Thyroid: No thyromegaly.     Vascular: No carotid bruit or JVD.  Trachea: Trachea normal. No tracheal tenderness or tracheal deviation.  Cardiovascular:     Rate and Rhythm: Normal rate and regular rhythm.     Chest Wall: PMI is not displaced.     Pulses: Normal pulses. No decreased pulses.     Heart sounds: Normal heart sounds, S1 normal and S2 normal. Heart sounds not distant. No murmur heard.    No systolic murmur is present.     No diastolic murmur is present.     No friction rub. No gallop. No S3 or S4 sounds.  Pulmonary:     Effort: Pulmonary effort is normal. No tachypnea, accessory muscle usage or respiratory distress.     Breath sounds: Normal breath sounds. No stridor. No decreased breath sounds, wheezing, rhonchi or rales.  Chest:     Chest wall: No tenderness.  Abdominal:     General: Abdomen is flat. Bowel sounds are increased. There is no distension.     Palpations: Abdomen is soft. Abdomen is not rigid.     Tenderness: There is no abdominal tenderness. There is no guarding or rebound.  Genitourinary:    Prostate: Enlarged.  Musculoskeletal:        General: Swelling and tenderness present. Normal range of motion.     Cervical back: Normal range of motion and neck supple. No edema, erythema or rigidity. No muscular tenderness. Normal range of motion.     Comments: Varicose veins right lower extremity  Lymphadenopathy:     Head:     Right side of head: No submental or submandibular adenopathy.     Left side of head: No submental or submandibular adenopathy.     Cervical: No cervical adenopathy.  Skin:    General: Skin is warm and dry.     Coloration: Skin is not pale.     Findings: No rash.     Nails: There is no clubbing.  Neurological:     Mental Status: He is alert  and oriented to person, place, and time.     Sensory: No sensory deficit.  Psychiatric:        Mood and Affect: Mood normal.        Speech: Speech normal.        Behavior: Behavior normal.    2017 COLONOSCOPY PER DR Danise Edge: Impression:               - The entire examined colon is normal.                           - No specimens collected.   No results found for any visits on 10/26/22.   Last CBC Lab Results  Component Value Date   WBC 7.2 03/06/2022   HGB 14.4 03/06/2022   HCT 45.5 03/06/2022   MCV 75 (L) 03/06/2022   MCH 23.6 (L) 03/06/2022   RDW 15.1 03/06/2022   PLT 240 03/06/2022   Last metabolic panel Lab Results  Component Value Date   GLUCOSE 119 (H) 08/17/2022   NA 142 08/17/2022   K 3.5 08/17/2022   CL 101 08/17/2022   CO2 25 08/17/2022   BUN 17 08/17/2022   CREATININE 0.90 08/17/2022   GFRNONAA >60 02/18/2022   CALCIUM 9.5 08/17/2022   PHOS 3.1 08/25/2020   PROT 7.7 08/17/2022   ALBUMIN 4.4 08/17/2022   LABGLOB 3.3 08/17/2022   AGRATIO 1.3 08/17/2022   BILITOT 0.3 08/17/2022   ALKPHOS 95 08/17/2022   AST  35 08/17/2022   ALT 49 (H) 08/17/2022   ANIONGAP 8 02/18/2022   Last lipids Lab Results  Component Value Date   CHOL 120 08/17/2022   HDL 45 08/17/2022   LDLCALC 57 08/17/2022   TRIG 93 08/17/2022   CHOLHDL 2.7 08/17/2022   Last hemoglobin A1c Lab Results  Component Value Date   HGBA1C 6.3 (H) 02/18/2022   Last thyroid functions Lab Results  Component Value Date   TSH 1.407 02/19/2022   Last vitamin D No results found for: "25OHVITD2", "25OHVITD3", "VD25OH" Last vitamin B12 and Folate No results found for: "VITAMINB12", "FOLATE"  The ASCVD Risk score (Arnett DK, et al., 2019) failed to calculate for the following reasons:   The patient has a prior MI or stroke diagnosis    Assessment & Plan:   Problem List Items Addressed This Visit       Cardiovascular and Mediastinum   HTN (hypertension)    Hypertension  controlled continue current medications       Relevant Medications   amLODipine (NORVASC) 10 MG tablet   atorvastatin (LIPITOR) 80 MG tablet   ezetimibe (ZETIA) 10 MG tablet   valsartan-hydrochlorothiazide (DIOVAN-HCT) 320-25 MG tablet   Varicose veins of right lower extremity with inflammation - Primary    Need to rule out DVT ultrasound ordered      Relevant Medications   amLODipine (NORVASC) 10 MG tablet   atorvastatin (LIPITOR) 80 MG tablet   ezetimibe (ZETIA) 10 MG tablet   valsartan-hydrochlorothiazide (DIOVAN-HCT) 320-25 MG tablet   Other Relevant Orders   US Venous Img Lower Unilateral Right     Digestive   Periodontal disease    Encouraged to keep upcoming appointment with dental        Genitourinary   BPH (benign prostatic hyperplasia)    Improved with tamsulosin      Relevant Medications   tamsulosin (FLOMAX) 0.4 MG CAPS capsule     Other   History of alcohol use    No longer drinking alcohol      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    Continue aspirin and high-dose  cholesterol therapy      Hyperlipidemia    Continue Zetia and atorvastatin high-dose      Relevant Medications   amLODipine (NORVASC) 10 MG tablet   atorvastatin (LIPITOR) 80 MG tablet   ezetimibe (ZETIA) 10 MG tablet   valsartan-hydrochlorothiazide (DIOVAN-HCT) 320-25 MG tablet   Right leg swelling   Relevant Orders   US Venous Img Lower Unilateral Right   RESOLVED: Hypokalemia    Resolved      Other Visit Diagnoses     History of CVA in adulthood       Relevant Medications   atorvastatin (LIPITOR) 80 MG tablet      Return in about 4 months (around 02/24/2023) for htn.    Asencion Noble, MD

## 2022-10-26 NOTE — Assessment & Plan Note (Signed)
Resolved

## 2022-10-26 NOTE — Patient Instructions (Signed)
Perform back exercises as attached and apply lidocaine patch for 12 hours a day take off at night to the lower back and apply Voltaren gel 2-3 times a day to the lower back for pain  Refills on all medications sent to pharmacy no change in dosing  Ultrasound of the right leg will be obtained  Please obtain a dental exam  Return to see Dr. Joya Gaskins 4 months  Will call with results of the leg ultrasound

## 2022-10-26 NOTE — Assessment & Plan Note (Signed)
Improved with tamsulosin

## 2022-10-30 ENCOUNTER — Telehealth: Payer: Self-pay | Admitting: Emergency Medicine

## 2022-10-30 ENCOUNTER — Other Ambulatory Visit: Payer: Self-pay | Admitting: Critical Care Medicine

## 2022-10-30 DIAGNOSIS — M7989 Other specified soft tissue disorders: Secondary | ICD-10-CM

## 2022-10-30 DIAGNOSIS — I8311 Varicose veins of right lower extremity with inflammation: Secondary | ICD-10-CM

## 2022-10-30 NOTE — Telephone Encounter (Signed)
Copied from [place]. Topic: Appointment Scheduling - Scheduling Inquiry for Clinic >> Oct 30, 2022  2:40 PM Erskine Squibb wrote: Reason for CRM: Jenny Reichmann with [facility] called to let the provider know they do not have any appts for over a week. She was thinking because it is to rule out a DVT the provider may want to schedule elsewhere. Please assist further

## 2022-10-30 NOTE — Telephone Encounter (Signed)
I am comfortable with this plan to keep It on the schedule as is

## 2022-11-01 NOTE — Telephone Encounter (Signed)
Jenny Reichmann says they have called the patient twice unsuccessfully. Unable to leave VM, mailbox full.

## 2022-11-07 ENCOUNTER — Other Ambulatory Visit: Payer: Self-pay

## 2022-12-25 ENCOUNTER — Other Ambulatory Visit: Payer: Self-pay | Admitting: Critical Care Medicine

## 2022-12-26 NOTE — Telephone Encounter (Signed)
Requested Prescriptions  Pending Prescriptions Disp Refills   diclofenac Sodium (VOLTAREN) 1 % GEL [Pharmacy Med Name: DICLOFENAC SODIUM 1% GEL] 100 g 2    Sig: APPLY 2 G TOPICALLY 4 (FOUR) TIMES DAILY. TO LOWER BACK     Analgesics:  Topicals Failed - 12/25/2022 12:17 AM      Failed - Manual Review: Labs are only required if the patient has taken medication for more than 8 weeks.      Passed - PLT in normal range and within 360 days    Platelets  Date Value Ref Range Status  03/06/2022 240 150 - 450 x10E3/uL Final         Passed - HGB in normal range and within 360 days    Hemoglobin  Date Value Ref Range Status  03/06/2022 14.4 13.0 - 17.7 g/dL Final         Passed - HCT in normal range and within 360 days    Hematocrit  Date Value Ref Range Status  03/06/2022 45.5 37.5 - 51.0 % Final         Passed - Cr in normal range and within 360 days    Creatinine, Ser  Date Value Ref Range Status  08/17/2022 0.90 0.76 - 1.27 mg/dL Final         Passed - eGFR is 30 or above and within 360 days    GFR calc Af Amer  Date Value Ref Range Status  03/09/2019 >60 >60 mL/min Final   GFR, Estimated  Date Value Ref Range Status  02/18/2022 >60 >60 mL/min Final    Comment:    (NOTE) Calculated using the CKD-EPI Creatinine Equation (2021)    eGFR  Date Value Ref Range Status  08/17/2022 96 >59 mL/min/1.73 Final         Passed - Patient is not pregnant      Passed - Valid encounter within last 12 months    Recent Outpatient Visits           2 months ago Varicose veins of right lower extremity with inflammation   [place] [facility] Storm Frisk, MD   4 months ago Primary hypertension   [place] [facility] Storm Frisk, MD   6 months ago Primary hypertension   [place] [facility] [place], Cornelius Moras, RPH-CPP   7 months ago Cerebrovascular accident (CVA), unspecified mechanism [facility])    [place] [facility] Storm Frisk, MD   9 months ago Cerebrovascular accident (CVA), unspecified mechanism [facility])   [facility] Storm Frisk, MD       Future Appointments             In 2 months D[place] Charlcie Cradle, MD [facility]

## 2023-02-26 NOTE — Progress Notes (Unsigned)
Subjective   Patient ID: Paul [name], male    DOB: 09-Mar-1961  Age: 62 y.o. MRN: [id number]  No chief complaint on file.  04/2022 Paul [name] is a 62 year old male with a history of cerebrovascular accident x2 (2021/2023), hypertension, benign prostatic hyperplasia, and chronic diarrhea presents to the office today for transfer of care following recent hospitalization. During his hospitalization, MRI revealed right periatrial matter with small evolving subacute vessel infarct. He was released from the hospital on June 5 after returning to baseline.  Today, Paul [name] is in good spirits after his hospitalization. He continues to struggle with chronic diarrhea and urinary frequency.  Otherwise, No acute concerns. Denies tobacco and substance abuse. Admits to consuming alcohol socially.  Basic Labs and Urinalysis with culture drawn today including: complete blood count, complete metabolic count, and Hepatitis C screening. Tetanus vaccine not given today due to office staff availability. Radiology closed at this time and patient instructed to follow up for chest x ray.  11/30 Patient seen in return follow-up other than having some dental pain for the past month he seems to be doing better on arrival however blood pressure is slightly elevated 137/88 I rechecked it and it is in this range she also has 140 250/80-90 at home with his home machine.  He is on valsartan HCT 160/25 daily and amlodipine 10 mg daily.  He tries to follow a healthy diet as well.  He has no other complaints.  The diarrhea syndrome he had in the past is now resolved.  The patient does have health insurance he is unsure if he has a dental plan   The patient is fully recovered from his stroke and is back at work he works as a [employer] certified accountant  Patient agrees to receive a tetanus shot at this visit  10/26/22 This patient seen in return follow-up he is complaining of low back pain and right leg edema and pain he works standing up all day  he also complains of a dry mouth but no pain in the mouth he does have an upcoming dental appointment.  Blood pressure on arrival 125/80.  He is compliant with all his medications at this time.  He is taking the valsartan HCT and amlodipine.  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 Pearlean Brownie and PT, outpatient vestibular rehab will be arranged by case management.  Currently close to his baseline will be discharged home with outpatient PCP and neurology follow-up.   Chronic diarrhea Month-long diarrhea worse with food.  Currently stable question if he is developing pancreatic insufficiency from alcohol use, recommend follow-up with GI will be arranged by PCP.   Alcohol use Reports 2-3 beers nightly. -Currently no signs of DTs, strictly counseled to quit alcohol.   HTN (hypertension) Appears to have  been at goal in the past.  Allow for permissive hypertension, blood pressure medications adjusted as below, of note patient is noncompliant with his blood pressure medications PCP to monitor closely.   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  PCP to monitor A1c 6.3.   Dyslipidemia.  Noncompliant with Lipitor, counseled, resume home dose Lipitor, also Zetia added.  05/10/22 Patient returns in  follow-up post stroke on arrival blood pressure elevated 165/91 despite taking amlodipine.  Patient tends to only eat dinner consists of mainly white rice and eggplant.  He skips breakfast and lunch.  He is no longer drinking alcohol.  Note he has been out of work since June 3 of this summer.  He is able to return to work on August 28.  He had paperwork regarding his FMLA need to be filled out by his company and we need to address this.  Patient is yet to have neurology follow-up and is yet to receive outpatient physical therapy for his stroke.  The hospital was to set this up but it did not occur.  Patient has occasional dizziness occasional headache but balance is improved which was findings when he had his stroke.  Patient does maintain fish oil Zetia and high-dose atorvastatin for cholesterol management.  Patient is maintaining thiamine and folic acid as well.  Patient is on tamsulosin low-dose and is this results in improved urine output. Gastrointestinal symptoms have resolved   Patient Active Problem List   Diagnosis Date Noted  . Varicose veins of right lower extremity with inflammation 10/26/2022  . Right leg swelling 10/26/2022  . Periodontal disease 08/17/2022  . Hyperlipidemia 03/07/2022  . 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 02/18/2022  . HTN (hypertension) 08/25/2020  . BPH (benign prostatic hyperplasia) 08/25/2020  . History of tobacco use 08/25/2020  . History of alcohol use 08/25/2020   Past Medical History:  Diagnosis Date  . Acute CVA (cerebrovascular accident) (HCC) 08/25/2020  . Hypertension   . Stroke (HCC)   . Urinary frequency    Past Surgical History:  Procedure Laterality Date  . COLONOSCOPY WITH PROPOFOL N/A 01/31/2016   Procedure: COLONOSCOPY WITH PROPOFOL;  Surgeon: Charolett Bumpers, MD;  Location: [facility];  Service: Endoscopy;  Laterality: N/A;  . NO PAST SURGERIES     Social History   Tobacco Use  . Smoking status: Former     Packs/day: 0.50    Years: 30.00    Additional pack years: 0.00    Total pack years: 15.00    Types: Cigarettes    Quit date: 08/24/2020    Years since quitting: 2.5  . Smokeless tobacco: Never  Vaping Use  . Vaping Use: Never used  Substance Use Topics  . Alcohol use: Yes    Comment: 1-2 beers most days  . Drug use: No   Social History   Socioeconomic History  . Marital status: Single    Spouse name: Not on file  . Number of children: Not on file  . Years of education: Not on file  . Highest education level: Not on file  Occupational History  . Not on file  Tobacco Use  . Smoking status: Former    Packs/day: 0.50    Years: 30.00    Additional pack years: 0.00    Total pack years: 15.00    Types: Cigarettes    Quit date: 08/24/2020    Years since quitting: 2.5  . Smokeless tobacco: Never  Vaping Use  . Vaping Use: Never used  Substance and Sexual Activity  . Alcohol use: Yes    Comment: 1-2 beers most days  . Drug use: No  . Sexual activity: Not on file  Other Topics Concern  . Not on file  Social History Narrative  . Not on file   Social Determinants of Health   Financial Resource Strain: Not on file  Food Insecurity: Not on file  Transportation Needs: Not on file  Physical Activity: Not on file  Stress: Not on file  Social Connections: Not on file  Intimate Partner Violence: Not on file   No family status information on file.   No family history on file. No Known Allergies  Review of Systems  Constitutional:  Negative for chills, diaphoresis, fever, malaise/fatigue and weight loss.  HENT:  Negative for congestion, hearing loss, nosebleeds, sore throat and tinnitus.        Dental pain  Eyes:  Negative for blurred vision, photophobia and redness.  Respiratory: Negative.  Negative for cough, hemoptysis, sputum production, shortness of breath, wheezing and stridor.   Cardiovascular:  Positive for leg swelling. Negative for chest pain, palpitations,  orthopnea, claudication and PND.  Gastrointestinal:  Negative for abdominal pain, blood in stool, constipation, diarrhea, heartburn, nausea and vomiting.  Genitourinary:  Negative for dysuria, flank pain, frequency, hematuria and urgency.  Musculoskeletal:  Negative for back pain, falls, joint pain, myalgias and neck pain.  Skin:  Negative for itching and rash.  Neurological:  Negative for dizziness, tingling, tremors, sensory change, speech change, focal weakness, seizures, loss of consciousness, weakness and headaches.  Endo/Heme/Allergies:  Negative for environmental allergies and polydipsia. Does not bruise/bleed easily.  Psychiatric/Behavioral:  Negative for depression, memory loss, substance abuse and suicidal ideas. The patient is not nervous/anxious and does not have insomnia.       Objective:     There were no vitals taken for this visit. BP Readings from Last 3 Encounters:  10/26/22 125/80  08/17/22 137/88  06/08/22 107/67   Wt Readings from Last 3 Encounters:  10/26/22 145 lb 3.2 oz (65.9 kg)  08/17/22 143 lb 9.6 oz (65.1 kg)  05/10/22 149 lb 9.6 oz (67.9 kg)      Physical Exam Vitals reviewed.  Constitutional:      Appearance: Normal appearance. He is well-developed and normal weight. He is not diaphoretic.  HENT:     Head: Normocephalic and atraumatic.     Right Ear: Tympanic membrane, ear canal and external ear normal.     Left Ear: Ear canal and external ear normal.     Nose: No nasal deformity, septal deviation, mucosal edema or rhinorrhea.     Right Sinus: No maxillary sinus tenderness or frontal sinus tenderness.     Left Sinus: No maxillary sinus tenderness or frontal sinus tenderness.     Mouth/Throat:     Mouth: Mucous membranes are moist.     Pharynx: Oropharynx is clear. No oropharyngeal exudate.     Comments: Poor dentition severe periodontal disease Eyes:     General: No scleral icterus.    Extraocular Movements: Extraocular movements intact.      Conjunctiva/sclera: Conjunctivae normal.     Pupils: Pupils are equal, round, and reactive to light.     Comments: Left tympanic membrane perforation  Neck:     Thyroid: No thyromegaly.     Vascular: No carotid bruit or JVD.     Trachea: Trachea normal. No tracheal tenderness or tracheal deviation.  Cardiovascular:     Rate and Rhythm:  Normal rate and regular rhythm.     Chest Wall: PMI is not displaced.     Pulses: Normal pulses. No decreased pulses.     Heart sounds: Normal heart sounds, S1 normal and S2 normal. Heart sounds not distant. No murmur heard.    No systolic murmur is present.     No diastolic murmur is present.     No friction rub. No gallop. No S3 or S4 sounds.  Pulmonary:     Effort: Pulmonary effort is normal. No tachypnea, accessory muscle usage or respiratory distress.     Breath sounds: Normal breath sounds. No stridor. No decreased breath sounds, wheezing, rhonchi or rales.  Chest:     Chest wall: No tenderness.  Abdominal:     General: Abdomen is flat. Bowel sounds are increased. There is no distension.     Palpations: Abdomen is soft. Abdomen is not rigid.     Tenderness: There is no abdominal tenderness. There is no guarding or rebound.  Genitourinary:    Prostate: Enlarged.  Musculoskeletal:        General: Swelling and tenderness present. Normal range of motion.     Cervical back: Normal range of motion and neck supple. No edema, erythema or rigidity. No muscular tenderness. Normal range of motion.     Comments: Varicose veins right lower extremity  Lymphadenopathy:     Head:     Right side of head: No submental or submandibular adenopathy.     Left side of head: No submental or submandibular adenopathy.     Cervical: No cervical adenopathy.  Skin:    General: Skin is warm and dry.     Coloration: Skin is not pale.     Findings: No rash.     Nails: There is no clubbing.  Neurological:     Mental Status: He is alert and oriented to person, place, and  time.     Sensory: No sensory deficit.  Psychiatric:        Mood and Affect: Mood normal.        Speech: Speech normal.        Behavior: Behavior normal.   2017 COLONOSCOPY PER DR Danise Edge: Impression:               - The entire examined colon is normal.                           - No specimens collected.   No results found for any visits on 03/01/23.   Last CBC Lab Results  Component Value Date   WBC 7.2 03/06/2022   HGB 14.4 03/06/2022   HCT 45.5 03/06/2022   MCV 75 (L) 03/06/2022   MCH 23.6 (L) 03/06/2022   RDW 15.1 03/06/2022   PLT 240 03/06/2022   Last metabolic panel Lab Results  Component Value Date   GLUCOSE 119 (H) 08/17/2022   NA 142 08/17/2022   K 3.5 08/17/2022   CL 101 08/17/2022   CO2 25 08/17/2022   BUN 17 08/17/2022   CREATININE 0.90 08/17/2022   GFRNONAA >60 02/18/2022   CALCIUM 9.5 08/17/2022   PHOS 3.1 08/25/2020   PROT 7.7 08/17/2022   ALBUMIN 4.4 08/17/2022   LABGLOB 3.3 08/17/2022   AGRATIO 1.3 08/17/2022   BILITOT 0.3 08/17/2022   ALKPHOS 95 08/17/2022   AST 35 08/17/2022   ALT 49 (H) 08/17/2022   ANIONGAP 8 02/18/2022   Last lipids Lab Results  Component Value Date   CHOL 120 08/17/2022   HDL 45 08/17/2022   LDLCALC 57 08/17/2022   TRIG 93 08/17/2022   CHOLHDL 2.7 08/17/2022   Last hemoglobin A1c Lab Results  Component Value Date   HGBA1C 6.3 (H) 02/18/2022   Last thyroid functions Lab Results  Component Value Date   TSH 1.407 02/19/2022   Last vitamin D No results found for: "25OHVITD2", "25OHVITD3", "VD25OH" Last vitamin B12 and Folate No results found for: "VITAMINB12", "FOLATE"  The ASCVD Risk score (Arnett DK, et al., 2019) failed to calculate for the following reasons:   The patient has a prior MI or stroke diagnosis    Assessment & Plan:   Problem List Items Addressed This Visit   None No follow-ups on file.    Shan Levans, MD

## 2023-03-01 ENCOUNTER — Other Ambulatory Visit: Payer: Self-pay

## 2023-03-01 ENCOUNTER — Ambulatory Visit: Payer: PRIVATE HEALTH INSURANCE | Attending: Critical Care Medicine | Admitting: Critical Care Medicine

## 2023-03-01 ENCOUNTER — Encounter: Payer: Self-pay | Admitting: Critical Care Medicine

## 2023-03-01 VITALS — BP 131/84 | HR 71 | Ht 64.0 in | Wt 142.4 lb

## 2023-03-01 DIAGNOSIS — Z8673 Personal history of transient ischemic attack (TIA), and cerebral infarction without residual deficits: Secondary | ICD-10-CM

## 2023-03-01 DIAGNOSIS — I8311 Varicose veins of right lower extremity with inflammation: Secondary | ICD-10-CM | POA: Diagnosis not present

## 2023-03-01 DIAGNOSIS — E782 Mixed hyperlipidemia: Secondary | ICD-10-CM

## 2023-03-01 DIAGNOSIS — I10 Essential (primary) hypertension: Secondary | ICD-10-CM

## 2023-03-01 DIAGNOSIS — N401 Enlarged prostate with lower urinary tract symptoms: Secondary | ICD-10-CM

## 2023-03-01 MED ORDER — EZETIMIBE 10 MG PO TABS
10.0000 mg | ORAL_TABLET | Freq: Every day | ORAL | 2 refills | Status: DC
  Filled 2023-03-01: qty 90, 90d supply, fill #0

## 2023-03-01 MED ORDER — TAMSULOSIN HCL 0.4 MG PO CAPS
0.4000 mg | ORAL_CAPSULE | Freq: Every day | ORAL | 1 refills | Status: DC
  Filled 2023-03-01: qty 90, 90d supply, fill #0

## 2023-03-01 MED ORDER — ATORVASTATIN CALCIUM 80 MG PO TABS
80.0000 mg | ORAL_TABLET | Freq: Every day | ORAL | 2 refills | Status: DC
  Filled 2023-03-01: qty 90, 90d supply, fill #0

## 2023-03-01 MED ORDER — AMLODIPINE BESYLATE 10 MG PO TABS
10.0000 mg | ORAL_TABLET | Freq: Every day | ORAL | 1 refills | Status: DC
  Filled 2023-03-01: qty 90, 90d supply, fill #0

## 2023-03-01 MED ORDER — VALSARTAN-HYDROCHLOROTHIAZIDE 320-25 MG PO TABS
1.0000 | ORAL_TABLET | Freq: Every day | ORAL | 3 refills | Status: DC
  Filled 2023-03-01: qty 90, 90d supply, fill #0

## 2023-03-01 NOTE — Assessment & Plan Note (Signed)
Improved - monitor 

## 2023-03-01 NOTE — Patient Instructions (Signed)
Return Dr Delford Field 3 months Urinalysis today Future refills sent to our pharmacy downstairs

## 2023-03-01 NOTE — Assessment & Plan Note (Signed)
Under control no change 

## 2023-03-01 NOTE — Assessment & Plan Note (Signed)
Continue tamsulosin check urinalysis

## 2023-03-01 NOTE — Assessment & Plan Note (Signed)
Continue current therapy check lipid

## 2023-05-11 ENCOUNTER — Other Ambulatory Visit: Payer: Self-pay | Admitting: Critical Care Medicine

## 2023-05-11 MED ORDER — VALSARTAN-HYDROCHLOROTHIAZIDE 320-25 MG PO TABS: 1.0000 | ORAL_TABLET | Freq: Every day | ORAL | 0 refills | Status: DC

## 2023-06-07 ENCOUNTER — Encounter: Payer: Self-pay | Admitting: Critical Care Medicine

## 2023-06-07 ENCOUNTER — Other Ambulatory Visit: Payer: Self-pay

## 2023-06-07 ENCOUNTER — Ambulatory Visit: Payer: Self-pay | Attending: Critical Care Medicine | Admitting: Critical Care Medicine

## 2023-06-07 VITALS — BP 123/78 | HR 67 | Wt 145.8 lb

## 2023-06-07 DIAGNOSIS — N401 Enlarged prostate with lower urinary tract symptoms: Secondary | ICD-10-CM

## 2023-06-07 DIAGNOSIS — Z8673 Personal history of transient ischemic attack (TIA), and cerebral infarction without residual deficits: Secondary | ICD-10-CM

## 2023-06-07 DIAGNOSIS — K056 Periodontal disease, unspecified: Secondary | ICD-10-CM

## 2023-06-07 DIAGNOSIS — I10 Essential (primary) hypertension: Secondary | ICD-10-CM

## 2023-06-07 DIAGNOSIS — L989 Disorder of the skin and subcutaneous tissue, unspecified: Secondary | ICD-10-CM | POA: Insufficient documentation

## 2023-06-07 DIAGNOSIS — R7303 Prediabetes: Secondary | ICD-10-CM | POA: Insufficient documentation

## 2023-06-07 DIAGNOSIS — E782 Mixed hyperlipidemia: Secondary | ICD-10-CM

## 2023-06-07 MED ORDER — AMLODIPINE BESYLATE 10 MG PO TABS
10.0000 mg | ORAL_TABLET | Freq: Every day | ORAL | 1 refills | Status: DC
  Filled 2023-06-07: qty 90, 90d supply, fill #0

## 2023-06-07 MED ORDER — ATORVASTATIN CALCIUM 80 MG PO TABS
80.0000 mg | ORAL_TABLET | Freq: Every day | ORAL | 2 refills | Status: DC
  Filled 2023-06-07: qty 90, 90d supply, fill #0

## 2023-06-07 MED ORDER — TAMSULOSIN HCL 0.4 MG PO CAPS
0.4000 mg | ORAL_CAPSULE | Freq: Every day | ORAL | 1 refills | Status: DC
Start: 2023-06-07 — End: 2023-12-06
  Filled 2023-06-07: qty 90, 90d supply, fill #0

## 2023-06-07 MED ORDER — DICLOFENAC SODIUM 1 % EX GEL
2.0000 g | Freq: Four times a day (QID) | CUTANEOUS | 2 refills | Status: AC
  Filled 2023-06-07: qty 100, 13d supply, fill #0

## 2023-06-07 MED ORDER — EZETIMIBE 10 MG PO TABS
10.0000 mg | ORAL_TABLET | Freq: Every day | ORAL | 2 refills | Status: DC
Start: 2023-06-07 — End: 2023-12-06
  Filled 2023-06-07: qty 90, 90d supply, fill #0

## 2023-06-07 MED ORDER — VALSARTAN-HYDROCHLOROTHIAZIDE 320-25 MG PO TABS
1.0000 | ORAL_TABLET | Freq: Every day | ORAL | 0 refills | Status: DC
  Filled 2023-06-07: qty 90, 90d supply, fill #0

## 2023-06-07 NOTE — Assessment & Plan Note (Signed)
Papular fibrous lesion lateral to the left eye and seborrheic keratosis posterior back will refer to dermatology per patient request

## 2023-06-07 NOTE — Assessment & Plan Note (Addendum)
Patient receiving dental care

## 2023-06-07 NOTE — Assessment & Plan Note (Addendum)
Continue statin and check labs

## 2023-06-07 NOTE — Assessment & Plan Note (Signed)
Not on therapy prior history of prediabetes reassess A1c

## 2023-06-07 NOTE — Progress Notes (Addendum)
Subjective   Patient ID: Paul [name], male    DOB: August 31, 1961  Age: 62 y.o. MRN: [id number]  Chief Complaint  Patient presents with   Medical Management of Chronic Issues   04/2022 Paul [name] is a 62 year old male with a history of cerebrovascular accident x2 (2021/2023), hypertension, benign prostatic hyperplasia, and chronic diarrhea presents to the office today for transfer of care following recent hospitalization. During his hospitalization, MRI revealed right periatrial matter with small evolving subacute vessel infarct. He was released from the hospital on June 5 after returning to baseline.  Today, Paul [name] is in good spirits after his hospitalization. He continues to struggle with chronic diarrhea and urinary frequency.  Otherwise, No acute concerns. Denies tobacco and substance abuse. Admits to consuming alcohol socially.  Basic Labs and Urinalysis with culture drawn today including: complete blood count, complete metabolic count, and Hepatitis C screening. Tetanus vaccine not given today due to office staff availability. Radiology closed at this time and patient instructed to follow up for chest x ray.  11/30 Patient seen in return follow-up other than having some dental pain for the past month he seems to be doing better on arrival however blood pressure is slightly elevated 137/88 I rechecked it and it is in this range she also has 140 250/80-90 at home with his home machine.  He is on valsartan HCT 160/25 daily and amlodipine 10 mg daily.  He tries to follow a healthy diet as well.  He has no other complaints.  The diarrhea syndrome he had in the past is now resolved.  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  Blood pressure on arrival 125/80.  He is compliant with all his medications at this time.  He is taking the valsartan HCT and amlodipine.  03/01/23 Patient seen in return follow-up still having some low back pain his leg swelling has improved blood pressure improved today blood pressure is 125/78 on arrival on recheck.  Patient's low back pain is improved.  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 Pearlean Brownie and PT, outpatient vestibular rehab will be arranged by case  management.  Currently close to his baseline will be discharged home with outpatient PCP and neurology follow-up.   Chronic diarrhea Month-long diarrhea worse with food.  Currently stable question if he is developing pancreatic insufficiency from alcohol use, recommend follow-up with GI will be arranged by PCP.   Alcohol use Reports 2-3 beers nightly. -Currently no signs of DTs, strictly counseled to quit alcohol.   HTN  (hypertension) Appears to have been at goal in the past.  Allow for permissive hypertension, blood pressure medications adjusted as below, of note patient is noncompliant with his blood pressure medications PCP to monitor closely.   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  PCP to monitor A1c 6.3.   Dyslipidemia.  Noncompliant with Lipitor, counseled, resume home dose Lipitor, also Zetia added.  05/10/22 Patient returns in follow-up post stroke on arrival blood pressure elevated 165/91 despite taking amlodipine.  Patient tends to only eat dinner consists of mainly white rice and eggplant.  He skips breakfast and lunch.  He is no longer drinking alcohol.  Note he has been out of work since June 3 of this summer.  He is able to return to work on August 28.  He had paperwork regarding his FMLA need to be filled out by his company and we need to address this.  Patient is yet to have neurology follow-up and is yet to receive outpatient physical therapy for his stroke.  The hospital was to set this up but it did not occur.  Patient has occasional dizziness occasional headache but balance is improved which was findings when he had his stroke.  Patient does maintain fish oil Zetia and high-dose atorvastatin for cholesterol management.  Patient is maintaining thiamine and folic acid as well.  Patient is on tamsulosin low-dose and is this results in improved urine output. 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/26/2022   Periodontal disease 08/17/2022   Hyperlipidemia 03/07/2022   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 02/18/2022   HTN (hypertension) 08/25/2020   BPH (benign prostatic hyperplasia) 08/25/2020   History of tobacco use 08/25/2020   History of alcohol use 08/25/2020   Past Medical History:  Diagnosis Date   Acute CVA  (cerebrovascular accident) (HCC) 08/25/2020   Hypertension    Stroke (HCC)    Urinary frequency    Past Surgical History:  Procedure Laterality Date   COLONOSCOPY WITH PROPOFOL N/A 01/31/2016   Procedure: COLONOSCOPY WITH PROPOFOL;  Surgeon: Charolett Bumpers, MD;  Location: [facility];  Service: Endoscopy;  Laterality: N/A;   NO PAST SURGERIES     Social History   Tobacco Use   Smoking status: Former    Current packs/day: 0.00    Average packs/day: 0.5 packs/day for 30.0 years (15.0 ttl pk-yrs)    Types: Cigarettes    Start date: 08/24/1990    Quit date: 08/24/2020    Years since quitting: 2.7   Smokeless tobacco: Never  Vaping Use   Vaping status: Never Used  Substance Use Topics   Alcohol use: Yes    Comment: 1-2 beers most days   Drug use: No   Social History   Socioeconomic History   Marital status: Single    Spouse name: Not on file   Number of children: Not on file   Years of education: Not on file   Highest education  level: Not on file  Occupational History   Not on file  Tobacco Use   Smoking status: Former    Current packs/day: 0.00    Average packs/day: 0.5 packs/day for 30.0 years (15.0 ttl pk-yrs)    Types: Cigarettes    Start date: 08/24/1990    Quit date: 08/24/2020    Years since quitting: 2.7   Smokeless tobacco: Never  Vaping Use   Vaping status: Never Used  Substance and Sexual Activity   Alcohol use: Yes    Comment: 1-2 beers most days   Drug use: No   Sexual activity: Not on file  Other Topics Concern   Not on file  Social History Narrative   Not on file   Social Determinants of Health   Financial Resource Strain: Not on file  Food Insecurity: Not on file  Transportation Needs: Not on file  Physical Activity: Not on file  Stress: Not on file  Social Connections: Unknown (11/20/2022)   Received from [facility], [facility]   Social Network    Social Network: Not on file  Intimate Partner Violence: Unknown (11/20/2022)   Received  from [facility], [facility]   HITS    Physically Hurt: Not on file    Insult or Talk Down To: Not on file    Threaten Physical Harm: Not on file    Scream or Curse: Not on file   No family status information on file.   History reviewed. No pertinent family history. No Known Allergies  Review of Systems  Constitutional:  Negative for chills, diaphoresis, fever, malaise/fatigue and weight loss.  HENT:  Negative for congestion, hearing loss, nosebleeds, sore throat and tinnitus.   Eyes:  Negative for blurred vision, photophobia and redness.  Respiratory: Negative.  Negative for cough, hemoptysis, sputum production, shortness of breath, wheezing and stridor.   Cardiovascular:  Negative for chest pain, palpitations, orthopnea, claudication, leg swelling and PND.  Gastrointestinal:  Negative for abdominal pain, blood in stool, constipation, diarrhea, heartburn, nausea and vomiting.  Genitourinary:  Negative for dysuria, flank pain, frequency, hematuria and urgency.  Musculoskeletal:  Negative for back pain, falls, joint pain, myalgias and neck pain.  Skin:  Negative for itching and rash.       Lesion on left eye  Neurological:  Negative for dizziness, tingling, tremors, sensory change, speech change, focal weakness, seizures, loss of consciousness, weakness and headaches.  Endo/Heme/Allergies:  Negative for environmental allergies and polydipsia. Does not bruise/bleed easily.  Psychiatric/Behavioral:  Negative for depression, memory loss, substance abuse and suicidal ideas. The patient is not nervous/anxious and does not have insomnia.       Objective:     BP 123/78   Pulse 67   Wt 145 lb 12.8 oz (66.1 kg)   SpO2 97%   BMI 25.03 kg/m  BP Readings from Last 3 Encounters:  06/07/23 123/78  03/01/23 131/84  10/26/22 125/80   Wt Readings from Last 3 Encounters:  06/07/23 145 lb 12.8 oz (66.1 kg)  03/01/23 142 lb 6.4 oz (64.6 kg)  10/26/22 145 lb 3.2 oz (65.9 kg)       Physical Exam Vitals reviewed.  Constitutional:      Appearance: Normal appearance. He is well-developed and normal weight. He is not diaphoretic.  HENT:     Head: Normocephalic and atraumatic.     Right Ear: Tympanic membrane, ear canal and external ear normal.     Left Ear: Ear canal and external ear normal.  Nose: No nasal deformity, septal deviation, mucosal edema or rhinorrhea.     Right Sinus: No maxillary sinus tenderness or frontal sinus tenderness.     Left Sinus: No maxillary sinus tenderness or frontal sinus tenderness.     Mouth/Throat:     Mouth: Mucous membranes are moist.     Pharynx: Oropharynx is clear. No oropharyngeal exudate.     Comments: Poor dentition severe periodontal disease Eyes:     General: No scleral icterus.    Extraocular Movements: Extraocular movements intact.     Conjunctiva/sclera: Conjunctivae normal.     Pupils: Pupils are equal, round, and reactive to light.     Comments: Papular fibroma left eye upper eyelid slightly lateral to the eyelid  Neck:     Thyroid: No thyromegaly.     Vascular: No carotid bruit or JVD.     Trachea: Trachea normal. No tracheal tenderness or tracheal deviation.  Cardiovascular:     Rate and Rhythm: Normal rate and regular rhythm.     Chest Wall: PMI is not displaced.     Pulses: Normal pulses. No decreased pulses.     Heart sounds: Normal heart sounds, S1 normal and S2 normal. Heart sounds not distant. No murmur heard.    No systolic murmur is present.     No diastolic murmur is present.     No friction rub. No gallop. No S3 or S4 sounds.  Pulmonary:     Effort: Pulmonary effort is normal. No tachypnea, accessory muscle usage or respiratory distress.     Breath sounds: Normal breath sounds. No stridor. No decreased breath sounds, wheezing, rhonchi or rales.  Chest:     Chest wall: No tenderness.  Abdominal:     General: Abdomen is flat. Bowel sounds are increased. There is no distension.     Palpations:  Abdomen is soft. Abdomen is not rigid.     Tenderness: There is no abdominal tenderness. There is no guarding or rebound.  Genitourinary:    Prostate: Enlarged.  Musculoskeletal:        General: No swelling or tenderness. Normal range of motion.     Cervical back: Normal range of motion and neck supple. No edema, erythema or rigidity. No muscular tenderness. Normal range of motion.  Lymphadenopathy:     Head:     Right side of head: No submental or submandibular adenopathy.     Left side of head: No submental or submandibular adenopathy.     Cervical: No cervical adenopathy.  Skin:    General: Skin is warm and dry.     Coloration: Skin is not pale.     Findings: No rash.     Nails: There is no clubbing.  Neurological:     Mental Status: He is alert and oriented to person, place, and time.     Sensory: No sensory deficit.  Psychiatric:        Mood and Affect: Mood normal.        Speech: Speech normal.        Behavior: Behavior normal.    2017 COLONOSCOPY PER DR Danise Edge: Impression:               - The entire examined colon is normal.                           - No specimens collected.   No results found for any visits on 06/07/23.   Last  CBC Lab Results  Component Value Date   WBC 7.2 03/06/2022   HGB 14.4 03/06/2022   HCT 45.5 03/06/2022   MCV 75 (L) 03/06/2022   MCH 23.6 (L) 03/06/2022   RDW 15.1 03/06/2022   PLT 240 03/06/2022   Last metabolic panel Lab Results  Component Value Date   GLUCOSE 119 (H) 08/17/2022   NA 142 08/17/2022   K 3.5 08/17/2022   CL 101 08/17/2022   CO2 25 08/17/2022   BUN 17 08/17/2022   CREATININE 0.90 08/17/2022   GFRNONAA >60 02/18/2022   CALCIUM 9.5 08/17/2022   PHOS 3.1 08/25/2020   PROT 7.7 08/17/2022   ALBUMIN 4.4 08/17/2022   LABGLOB 3.3 08/17/2022   AGRATIO 1.3 08/17/2022   BILITOT 0.3 08/17/2022   ALKPHOS 95 08/17/2022   AST 35 08/17/2022   ALT 49 (H) 08/17/2022   ANIONGAP 8 02/18/2022   Last lipids Lab  Results  Component Value Date   CHOL 120 08/17/2022   HDL 45 08/17/2022   LDLCALC 57 08/17/2022   TRIG 93 08/17/2022   CHOLHDL 2.7 08/17/2022   Last hemoglobin A1c Lab Results  Component Value Date   HGBA1C 6.3 (H) 02/18/2022   Last thyroid functions Lab Results  Component Value Date   TSH 1.407 02/19/2022   Last vitamin D No results found for: "25OHVITD2", "25OHVITD3", "VD25OH" Last vitamin B12 and Folate No results found for: "VITAMINB12", "FOLATE"  The ASCVD Risk score (Arnett DK, et al., 2019) failed to calculate for the following reasons:   The patient has a prior MI or stroke diagnosis    Assessment & Plan:   Problem List Items Addressed This Visit       Cardiovascular and Mediastinum   HTN (hypertension) - Primary    Hypertension well-controlled will continue with amlodipine 10 mg daily valsartan HCT 320/25 daily      Relevant Medications   amLODipine (NORVASC) 10 MG tablet   ezetimibe (ZETIA) 10 MG tablet   atorvastatin (LIPITOR) 80 MG tablet   valsartan-hydrochlorothiazide (DIOVAN-HCT) 320-25 MG tablet   Other Relevant Orders   Comprehensive metabolic panel     Digestive   Periodontal disease    Patient receiving dental care        Musculoskeletal and Integument   Skin lesions    Papular fibrous lesion lateral to the left eye and seborrheic keratosis posterior back will refer to dermatology per patient request      Relevant Orders   Ambulatory referral to Dermatology     Genitourinary   BPH (benign prostatic hyperplasia)   Relevant Medications   tamsulosin (FLOMAX) 0.4 MG CAPS capsule     Other   Hyperlipidemia    Continue statin and check labs      Relevant Medications   amLODipine (NORVASC) 10 MG tablet   ezetimibe (ZETIA) 10 MG tablet   atorvastatin (LIPITOR) 80 MG tablet   valsartan-hydrochlorothiazide (DIOVAN-HCT) 320-25 MG tablet   Other Relevant Orders   Lipid panel   Prediabetes    Not on therapy prior history of  prediabetes reassess A1c      Relevant Orders   Hemoglobin A1c   Comprehensive metabolic panel   Other Visit Diagnoses     History of CVA in adulthood       Relevant Medications   atorvastatin (LIPITOR) 80 MG tablet   Other Relevant Orders   Lipid panel      Return in about 6 months (around 12/05/2023) for htn, prediabetes.    Luisa Hart  Delford Field, MD

## 2023-06-07 NOTE — Patient Instructions (Addendum)
All medications refilled  Use cream on lower back diclofenac cream  1 -2 times a day for back pain Labs today REturn 6 months new primary care MD Eye referral made

## 2023-06-07 NOTE — Assessment & Plan Note (Signed)
Hypertension well-controlled will continue with amlodipine 10 mg daily valsartan HCT 320/25 daily

## 2023-06-08 ENCOUNTER — Telehealth: Payer: Self-pay

## 2023-06-08 LAB — COMPREHENSIVE METABOLIC PANEL
ALT: 32 IU/L (ref 0–44)
AST: 27 IU/L (ref 0–40)
Albumin: 4.7 g/dL (ref 3.9–4.9)
Alkaline Phosphatase: 78 IU/L (ref 44–121)
BUN/Creatinine Ratio: 23 (ref 10–24)
BUN: 20 mg/dL (ref 8–27)
Bilirubin Total: <0.2 mg/dL (ref 0.0–1.2)
CO2: 25 mmol/L (ref 20–29)
Calcium: 9.5 mg/dL (ref 8.6–10.2)
Chloride: 102 mmol/L (ref 96–106)
Creatinine, Ser: 0.87 mg/dL (ref 0.76–1.27)
Globulin, Total: 2.9 g/dL (ref 1.5–4.5)
Glucose: 110 mg/dL — ABNORMAL HIGH (ref 70–99)
Potassium: 3.6 mmol/L (ref 3.5–5.2)
Sodium: 143 mmol/L (ref 134–144)
Total Protein: 7.6 g/dL (ref 6.0–8.5)
eGFR: 96 mL/min/{1.73_m2} (ref >59)

## 2023-06-08 LAB — LIPID PANEL
Chol/HDL Ratio: 3.3 ratio (ref 0.0–5.0)
Cholesterol, Total: 190 mg/dL (ref 100–199)
HDL: 58 mg/dL (ref >39)
LDL Chol Calc (NIH): 99 mg/dL (ref 0–99)
Triglycerides: 196 mg/dL — ABNORMAL HIGH (ref 0–149)
VLDL Cholesterol Cal: 33 mg/dL (ref 5–40)

## 2023-06-08 LAB — HEMOGLOBIN A1C
Est. average glucose Bld gHb Est-mCnc: 131 mg/dL
Hgb A1c MFr Bld: 6.2 % — ABNORMAL HIGH (ref 4.8–5.6)

## 2023-06-08 NOTE — Telephone Encounter (Signed)
Pt was called and no vm was left due to mailbox being full. Information has been sent to nurse pool.

## 2023-06-08 NOTE — Telephone Encounter (Signed)
-----   Message from Shan Levans sent at 06/08/2023  5:50 AM EDT ----- Let pt know all labs normal. No diabetes. No medication needed

## 2023-06-08 NOTE — Progress Notes (Signed)
Let pt know all labs normal. No diabetes. No medication needed

## 2023-06-21 ENCOUNTER — Other Ambulatory Visit: Payer: Self-pay

## 2023-06-22 ENCOUNTER — Other Ambulatory Visit: Payer: Self-pay

## 2023-07-03 ENCOUNTER — Other Ambulatory Visit: Payer: Self-pay

## 2023-12-06 ENCOUNTER — Encounter: Payer: Self-pay | Admitting: Family Medicine

## 2023-12-06 ENCOUNTER — Ambulatory Visit: Payer: Self-pay | Attending: Family Medicine | Admitting: Family Medicine

## 2023-12-06 ENCOUNTER — Other Ambulatory Visit: Payer: Self-pay

## 2023-12-06 VITALS — BP 149/92 | HR 63 | Ht 64.0 in | Wt 145.6 lb

## 2023-12-06 DIAGNOSIS — Z8673 Personal history of transient ischemic attack (TIA), and cerebral infarction without residual deficits: Secondary | ICD-10-CM

## 2023-12-06 DIAGNOSIS — N401 Enlarged prostate with lower urinary tract symptoms: Secondary | ICD-10-CM

## 2023-12-06 DIAGNOSIS — I10 Essential (primary) hypertension: Secondary | ICD-10-CM

## 2023-12-06 DIAGNOSIS — R7303 Prediabetes: Secondary | ICD-10-CM

## 2023-12-06 DIAGNOSIS — E782 Mixed hyperlipidemia: Secondary | ICD-10-CM

## 2023-12-06 DIAGNOSIS — R140 Abdominal distension (gaseous): Secondary | ICD-10-CM

## 2023-12-06 DIAGNOSIS — E785 Hyperlipidemia, unspecified: Secondary | ICD-10-CM

## 2023-12-06 LAB — POCT GLYCOSYLATED HEMOGLOBIN (HGB A1C): HbA1c, POC (prediabetic range): 6 % (ref 5.7–6.4)

## 2023-12-06 MED ORDER — VALSARTAN-HYDROCHLOROTHIAZIDE 320-25 MG PO TABS
1.0000 | ORAL_TABLET | Freq: Every day | ORAL | 1 refills | Status: DC
  Filled 2023-12-06: qty 90, 90d supply, fill #0

## 2023-12-06 MED ORDER — ATORVASTATIN CALCIUM 80 MG PO TABS
80.0000 mg | ORAL_TABLET | Freq: Every day | ORAL | 2 refills | Status: DC
  Filled 2023-12-06: qty 90, 90d supply, fill #0

## 2023-12-06 MED ORDER — EZETIMIBE 10 MG PO TABS
10.0000 mg | ORAL_TABLET | Freq: Every day | ORAL | 2 refills | Status: AC
  Filled 2023-12-06: qty 90, 90d supply, fill #0

## 2023-12-06 MED ORDER — AMLODIPINE BESYLATE 10 MG PO TABS
10.0000 mg | ORAL_TABLET | Freq: Every day | ORAL | 1 refills | Status: DC
  Filled 2023-12-06: qty 90, 90d supply, fill #0

## 2023-12-06 MED ORDER — TAMSULOSIN HCL 0.4 MG PO CAPS
0.4000 mg | ORAL_CAPSULE | Freq: Every day | ORAL | 1 refills | Status: AC
  Filled 2023-12-06: qty 90, 90d supply, fill #0

## 2023-12-06 NOTE — Patient Instructions (Addendum)
 Placed in [facility]  [address], [place], [place] [address] Ph# [phone] Fax [phone]

## 2023-12-06 NOTE — Progress Notes (Signed)
 Subjective:  Patient ID: Paul [name], male    DOB: 12-30-1960  Age: 63 y.o. MRN: [id number]  CC: Medical Management of Chronic Issues     Discussed the use of AI scribe software for clinical note transcription with the patient, who gave verbal consent to proceed.  History of Present Illness The patient, with a history of CVA, hypertension, hyperlipidemia, prediabetes, and benign prostatic hyperplasia (BPH), presents with a concern about a discrepancy in the size of his medication from different pharmacies. He reports that the medication has the same name and dose, but the size is larger from one pharmacy.  The patient's blood pressure was found to be elevated during the visit, despite adherence to his antihypertensive medication, which he took earlier in the day.  He also reports a persistent issue with his stomach making noises frequently throughout the day, even when he is not hungry. He occasionally experiences mild pain and increased flatulence and belching. He denies any significant changes in his diet and reports eating two meals a day. He does have prediabetes and A1c is 6.0 down from 6.2 previously. Lastly, the patient expresses a desire for a dermatology referral for a general skin check, as a previous referral did not result in a scheduled appointment.    Past Medical History:  Diagnosis Date   Acute CVA (cerebrovascular accident) (HCC) 08/25/2020   Hypertension    Stroke [facility])    Urinary frequency     Past Surgical History:  Procedure Laterality Date   COLONOSCOPY WITH PROPOFOL N/A 01/31/2016   Procedure: COLONOSCOPY WITH PROPOFOL;  Surgeon: Charolett Bumpers, MD;  Location: [facility];  Service: Endoscopy;  Laterality: N/A;   NO PAST SURGERIES      No family history on file.  Social History   Socioeconomic History   Marital status: Single    Spouse name: Not on file   Number of children: Not on file   Years of education: Not on file   Highest education level:  Not on file  Occupational History   Not on file  Tobacco Use   Smoking status: Former    Current packs/day: 0.00    Average packs/day: 0.5 packs/day for 30.0 years (15.0 ttl pk-yrs)    Types: Cigarettes    Start date: 08/24/1990    Quit date: 08/24/2020    Years since quitting: 3.2   Smokeless tobacco: Never  Vaping Use   Vaping status: Never Used  Substance and Sexual Activity   Alcohol use: Yes    Comment: 1-2 beers most days   Drug use: No   Sexual activity: Not on file  Other Topics Concern   Not on file  Social History Narrative   Not on file   Social Drivers of Health   Financial Resource Strain: Not on file  Food Insecurity: Not on file  Transportation Needs: Not on file  Physical Activity: Not on file  Stress: Not on file  Social Connections: Unknown (11/20/2022)   Received from [facility]   Social Network    Social Network: Not on file    No Known Allergies  Outpatient Medications Prior to Visit  Medication Sig Dispense Refill   aspirin EC (ASPIRIN 81) 81 MG tablet Take 1 tablet (81 mg total) by mouth daily. Swallow whole. 30 tablet 11   Blood Pressure Monitoring (BLOOD PRESSURE KIT) DEVI Use to measure blood pressure 1 each 0   diclofenac Sodium (VOLTAREN) 1 % GEL Apply 2 g topically 4 (four)  times daily. To lower back 100 g 2   MAGNESIUM PO Take 1 tablet by mouth daily with breakfast.     Omega-3 Fatty Acids (FISH OIL PO) Take 1 capsule by mouth daily.     amLODipine (NORVASC) 10 MG tablet Take 1 tablet (10 mg total) by mouth daily. 90 tablet 1   atorvastatin (LIPITOR) 80 MG tablet Take 1 tablet (80 mg total) by mouth daily. 90 tablet 2   ezetimibe (ZETIA) 10 MG tablet Take 1 tablet (10 mg total) by mouth daily. 90 tablet 2   tamsulosin (FLOMAX) 0.4 MG CAPS capsule Take 1 capsule (0.4 mg total) by mouth daily. 90 capsule 1   valsartan-hydrochlorothiazide (DIOVAN-HCT) 320-25 MG tablet Take 1 tablet by mouth daily. 90 tablet 0   No  facility-administered medications prior to visit.     ROS Review of Systems  Constitutional:  Negative for activity change and appetite change.  HENT:  Negative for sinus pressure and sore throat.   Eyes:  Negative for visual disturbance.  Respiratory:  Negative for cough, chest tightness and shortness of breath.   Cardiovascular:  Negative for chest pain and leg swelling.  Gastrointestinal:  Negative for abdominal distention, abdominal pain, constipation and diarrhea.  Endocrine: Negative.   Genitourinary:  Negative for dysuria.  Musculoskeletal:  Negative for joint swelling and myalgias.  Skin:  Negative for rash.  Allergic/Immunologic: Negative.   Neurological:  Negative for weakness, light-headedness and numbness.  Psychiatric/Behavioral:  Negative for dysphoric mood and suicidal ideas.     Objective:  BP (!) 149/92   Pulse 63   Ht 5\' 4"  (1.626 m)   Wt 145 lb 9.6 oz (66 kg)   SpO2 98%   BMI 24.99 kg/m      12/06/2023   11:19 AM 12/06/2023   10:47 AM 06/07/2023   10:24 AM  BP/Weight  Systolic BP 149 153 123  Diastolic BP 92 92 78  Wt. (Lbs)  145.6   BMI  24.99 kg/m2       Physical Exam Constitutional:      Appearance: He is well-developed.  Cardiovascular:     Rate and Rhythm: Normal rate.     Heart sounds: Normal heart sounds. No murmur heard. Pulmonary:     Effort: Pulmonary effort is normal.     Breath sounds: Normal breath sounds. No wheezing or rales.  Chest:     Chest wall: No tenderness.  Abdominal:     General: Bowel sounds are normal. There is no distension.     Palpations: Abdomen is soft. There is no mass.     Tenderness: There is no abdominal tenderness.  Musculoskeletal:        General: Normal range of motion.     Right lower leg: No edema.     Left lower leg: No edema.  Neurological:     Mental Status: He is alert and oriented to person, place, and time.  Psychiatric:        Mood and Affect: Mood normal.        Latest Ref Rng &  Units 06/07/2023   11:09 AM 08/17/2022   12:12 PM 06/08/2022   12:02 PM  CMP  Glucose 70 - 99 mg/dL 161  096  045   BUN 8 - 27 mg/dL 20  17  20    Creatinine 0.76 - 1.27 mg/dL 4.09  8.11  9.14   Sodium 134 - 144 mmol/L 143  142  138   Potassium 3.5 - 5.2  mmol/L 3.6  3.5  3.2   Chloride 96 - 106 mmol/L 102  101  99   CO2 20 - 29 mmol/L 25  25  26    Calcium 8.6 - 10.2 mg/dL 9.5  9.5  9.0   Total Protein 6.0 - 8.5 g/dL 7.6  7.7  7.4   Total Bilirubin 0.0 - 1.2 mg/dL <4.0  0.3  0.4   Alkaline Phos 44 - 121 IU/L 78  95  103   AST 0 - 40 IU/L 27  35  35   ALT 0 - 44 IU/L 32  49  48     Lipid Panel     Component Value Date/Time   CHOL 190 06/07/2023 1109   TRIG 196 (H) 06/07/2023 1109   HDL 58 06/07/2023 1109   CHOLHDL 3.3 06/07/2023 1109   CHOLHDL 3.6 02/19/2022 0231   VLDL 15 02/19/2022 0231   LDLCALC 99 06/07/2023 1109    CBC    Component Value Date/Time   WBC 7.2 03/06/2022 1635   WBC 6.6 02/18/2022 1424   RBC 6.09 (H) 03/06/2022 1635   RBC 6.12 (H) 02/18/2022 1424   HGB 14.4 03/06/2022 1635   HCT 45.5 03/06/2022 1635   PLT 240 03/06/2022 1635   MCV 75 (L) 03/06/2022 1635   MCH 23.6 (L) 03/06/2022 1635   MCH 24.0 (L) 02/18/2022 1424   MCHC 31.6 03/06/2022 1635   MCHC 31.6 02/18/2022 1424   RDW 15.1 03/06/2022 1635   LYMPHSABS 1.7 03/06/2022 1635   MONOABS 0.4 02/18/2022 1424   EOSABS 0.2 03/06/2022 1635   BASOSABS 0.0 03/06/2022 1635    Lab Results  Component Value Date   HGBA1C 6.0 12/06/2023       Assessment & Plan Abdominal Bloating and Gas Suspected Helicobacter pylori infection. - Order Helicobacter pylori test.  Hypertension Blood pressure elevated despite medication. -Blood pressure was normal at his last visit with his PCP hence I will make no regimen change today. - Ensure refills for hypertension medication.  Hyperlipidemia -Controlled -Continue statin - Ensure refills for hyperlipidemia medication.  Prediabetes A1c improved from 6.2 to  6.0. -Continue to work on lifestyle modifications  Benign Prostatic Hyperplasia (BPH) On Flomax - Ensure refills for BPH medication.  General Health Maintenance Requested referral for skin exam. - Give contact information for dermatologist as he was previously referred to [facility] health dermatology but did not hear from them.      Meds ordered this encounter  Medications   valsartan-hydrochlorothiazide (DIOVAN-HCT) 320-25 MG tablet    Sig: Take 1 tablet by mouth daily.    Dispense:  90 tablet    Refill:  1   amLODipine (NORVASC) 10 MG tablet    Sig: Take 1 tablet (10 mg total) by mouth daily.    Dispense:  90 tablet    Refill:  1   atorvastatin (LIPITOR) 80 MG tablet    Sig: Take 1 tablet (80 mg total) by mouth daily.    Dispense:  90 tablet    Refill:  2   ezetimibe (ZETIA) 10 MG tablet    Sig: Take 1 tablet (10 mg total) by mouth daily.    Dispense:  90 tablet    Refill:  2   tamsulosin (FLOMAX) 0.4 MG CAPS capsule    Sig: Take 1 capsule (0.4 mg total) by mouth daily.    Dispense:  90 capsule    Refill:  1    Follow-up: Return in about 6 months (around 06/07/2024)  for Chronic medical conditions.       Hoy Register, MD, FAAFP. [facility] [place], [place] [phone]   12/06/2023, 11:29 AM

## 2023-12-08 LAB — CMP14+EGFR
ALT: 27 IU/L (ref 0–44)
AST: 24 IU/L (ref 0–40)
Albumin: 4.6 g/dL (ref 3.9–4.9)
Alkaline Phosphatase: 77 IU/L (ref 44–121)
BUN/Creatinine Ratio: 25 — ABNORMAL HIGH (ref 10–24)
BUN: 27 mg/dL (ref 8–27)
Bilirubin Total: 0.3 mg/dL (ref 0.0–1.2)
CO2: 28 mmol/L (ref 20–29)
Calcium: 9.9 mg/dL (ref 8.6–10.2)
Chloride: 102 mmol/L (ref 96–106)
Creatinine, Ser: 1.06 mg/dL (ref 0.76–1.27)
Globulin, Total: 3.6 g/dL (ref 1.5–4.5)
Glucose: 100 mg/dL — ABNORMAL HIGH (ref 70–99)
Potassium: 4 mmol/L (ref 3.5–5.2)
Sodium: 143 mmol/L (ref 134–144)
Total Protein: 8.2 g/dL (ref 6.0–8.5)
eGFR: 78 mL/min/{1.73_m2} (ref >59)

## 2023-12-08 LAB — LP+NON-HDL CHOLESTEROL
Cholesterol, Total: 195 mg/dL (ref 100–199)
HDL: 75 mg/dL (ref >39)
LDL Chol Calc (NIH): 103 mg/dL — ABNORMAL HIGH (ref 0–99)
Total Non-HDL-Chol (LDL+VLDL): 120 mg/dL (ref 0–129)
Triglycerides: 99 mg/dL (ref 0–149)
VLDL Cholesterol Cal: 17 mg/dL (ref 5–40)

## 2023-12-08 LAB — H. PYLORI BREATH TEST: H pylori Breath Test: NEGATIVE

## 2023-12-08 LAB — H. PYLORI BREATH COLLECTION

## 2023-12-10 ENCOUNTER — Encounter: Payer: Self-pay | Admitting: Family Medicine

## 2024-04-21 ENCOUNTER — Encounter: Payer: Self-pay | Admitting: Physician Assistant

## 2024-04-21 ENCOUNTER — Other Ambulatory Visit: Payer: Self-pay

## 2024-04-21 ENCOUNTER — Ambulatory Visit: Payer: PRIVATE HEALTH INSURANCE | Admitting: Physician Assistant

## 2024-04-21 VITALS — BP 176/113

## 2024-04-21 DIAGNOSIS — D229 Melanocytic nevi, unspecified: Secondary | ICD-10-CM

## 2024-04-21 DIAGNOSIS — L814 Other melanin hyperpigmentation: Secondary | ICD-10-CM | POA: Diagnosis not present

## 2024-04-21 DIAGNOSIS — Z1283 Encounter for screening for malignant neoplasm of skin: Secondary | ICD-10-CM | POA: Diagnosis not present

## 2024-04-21 DIAGNOSIS — L219 Seborrheic dermatitis, unspecified: Secondary | ICD-10-CM

## 2024-04-21 DIAGNOSIS — L578 Other skin changes due to chronic exposure to nonionizing radiation: Secondary | ICD-10-CM

## 2024-04-21 DIAGNOSIS — D1801 Hemangioma of skin and subcutaneous tissue: Secondary | ICD-10-CM

## 2024-04-21 DIAGNOSIS — W908XXA Exposure to other nonionizing radiation, initial encounter: Secondary | ICD-10-CM | POA: Diagnosis not present

## 2024-04-21 DIAGNOSIS — L821 Other seborrheic keratosis: Secondary | ICD-10-CM

## 2024-04-21 MED ORDER — HYDROCORTISONE 2.5 % EX CREA
TOPICAL_CREAM | CUTANEOUS | 3 refills | Status: AC
  Filled 2024-04-21: qty 30, 30d supply, fill #0
  Filled 2024-06-09: qty 30, 15d supply, fill #0

## 2024-04-21 NOTE — Progress Notes (Signed)
   New Patient Visit   Subjective  Paul [name] is a 63 y.o. male who presents for the following: Skin Cancer Screening and Upper Body Skin Exam  The patient presents for Upper Body Skin Exam (UBSE) for skin cancer screening and mole check. The patient has spots, moles and lesions to be evaluated, some may be new or changing and the patient may have concern these could be cancer.    The following portions of the chart were reviewed this encounter and updated as appropriate: medications, allergies, medical history  Review of Systems:  No other skin or systemic complaints except as noted in HPI or Assessment and Plan.  Objective  Well appearing patient in no apparent distress; mood and affect are within normal limits.  All skin waist up examined. Relevant physical exam findings are noted in the Assessment and Plan.    Assessment & Plan    Skin cancer screening performed today.  Actinic Damage - Chronic condition, secondary to cumulative UV/sun exposure - diffuse scaly erythematous macules with underlying dyspigmentation - Recommend daily broad spectrum sunscreen SPF 30+ to sun-exposed areas, reapply every 2 hours as needed.  - Staying in the shade or wearing long sleeves, sun glasses (UVA+UVB protection) and wide brim hats (4-inch brim around the entire circumference of the hat) are also recommended for sun protection.  - Call for new or changing lesions.  Lentigines, Seborrheic Keratoses, Hemangiomas - Benign normal skin lesions - Benign-appearing - Call for any changes  Melanocytic Nevi - Tan-brown and/or pink-flesh-colored symmetric macules and papules - Benign appearing on exam today - Observation - Call clinic for new or changing moles - Recommend daily use of broad spectrum spf 30+ sunscreen to sun-exposed areas.   SEBORRHEIC DERMATITIS Exam: Pink patches with greasy scale at face. Seborrheic Dermatitis is a chronic persistent rash characterized by pinkness and  scaling most commonly of the mid face but also can occur on the scalp (dandruff), ears; mid chest, mid back and groin.  It tends to be exacerbated by stress and cooler weather.  People who have neurologic disease may experience new onset or exacerbation of existing seborrheic dermatitis.  The condition is not curable but treatable and can be controlled.  Treatment Plan: Hydrocortisone  2.5% cream Apply to face Monday, Wednesday, Friday.  Return if symptoms worsen or fail to improve.  I, Roseline Hutchinson, CMA, am acting as scribe for Jacyln Carmer K, PA-C .   Documentation: I have reviewed the above documentation for accuracy and completeness, and I agree with the above.  Brandy Kabat K, PA-C

## 2024-04-21 NOTE — Patient Instructions (Signed)

## 2024-04-29 ENCOUNTER — Other Ambulatory Visit: Payer: Self-pay

## 2024-04-30 ENCOUNTER — Other Ambulatory Visit: Payer: Self-pay

## 2024-06-09 ENCOUNTER — Other Ambulatory Visit: Payer: Self-pay

## 2024-06-09 ENCOUNTER — Encounter: Payer: Self-pay | Admitting: Family Medicine

## 2024-06-09 ENCOUNTER — Ambulatory Visit: Payer: PRIVATE HEALTH INSURANCE | Attending: Family Medicine | Admitting: Family Medicine

## 2024-06-09 VITALS — BP 186/106 | HR 67 | Ht 64.0 in | Wt 142.2 lb

## 2024-06-09 DIAGNOSIS — Z8673 Personal history of transient ischemic attack (TIA), and cerebral infarction without residual deficits: Secondary | ICD-10-CM | POA: Diagnosis not present

## 2024-06-09 DIAGNOSIS — M5432 Sciatica, left side: Secondary | ICD-10-CM

## 2024-06-09 DIAGNOSIS — I10 Essential (primary) hypertension: Secondary | ICD-10-CM

## 2024-06-09 DIAGNOSIS — Z125 Encounter for screening for malignant neoplasm of prostate: Secondary | ICD-10-CM

## 2024-06-09 DIAGNOSIS — N401 Enlarged prostate with lower urinary tract symptoms: Secondary | ICD-10-CM

## 2024-06-09 MED ORDER — TIZANIDINE HCL 4 MG PO TABS
4.0000 mg | ORAL_TABLET | Freq: Three times a day (TID) | ORAL | 1 refills | Status: DC | PRN
  Filled 2024-06-09: qty 60, 20d supply, fill #0

## 2024-06-09 MED ORDER — SPIRONOLACTONE 25 MG PO TABS
25.0000 mg | ORAL_TABLET | Freq: Every day | ORAL | 1 refills | Status: DC
  Filled 2024-06-09: qty 90, 90d supply, fill #0

## 2024-06-09 MED ORDER — ATORVASTATIN CALCIUM 80 MG PO TABS
80.0000 mg | ORAL_TABLET | Freq: Every day | ORAL | 1 refills | Status: AC
  Filled 2024-06-09: qty 90, 90d supply, fill #0

## 2024-06-09 MED ORDER — VALSARTAN-HYDROCHLOROTHIAZIDE 320-25 MG PO TABS
1.0000 | ORAL_TABLET | Freq: Every day | ORAL | 1 refills | Status: AC
  Filled 2024-06-09: qty 90, 90d supply, fill #0

## 2024-06-09 MED ORDER — AMLODIPINE BESYLATE 10 MG PO TABS
10.0000 mg | ORAL_TABLET | Freq: Every day | ORAL | 1 refills | Status: AC
  Filled 2024-06-09: qty 90, 90d supply, fill #0

## 2024-06-09 NOTE — Progress Notes (Signed)
 Subjective:  Patient ID: Paul [name], male    DOB: 1961-03-11  Age: 63 y.o. MRN: [id number]  CC: Medical Management of Chronic Issues (Lower back pain)     Discussed the use of AI scribe software for clinical note transcription with the patient, who gave verbal consent to proceed.  History of Present Illness Paul [name] is a 63 year old male with a history of CVA, hypertension, hyperlipidemia, prediabetes, and benign prostatic hyperplasia (BPH) and prediabetes who presents with elevated blood pressure and low back pain.  He experiences elevated blood pressure despite taking amlodipine  10 mg and valsartan  hydrochlorothiazide  320/25 mg. His blood pressure was also elevated at the last visit.  He has low back pain on the left side for a couple of weeks, radiating down his leg without numbness or tingling. Pain is rated 2 to 3 out of 10 but can worsen. Tylenol  provides occasional relief. No specific activities alleviate the pain.  He has prediabetes with a stable A1c of 6.0. He takes Flomax  for an enlarged prostate and atorvastatin  for a history of stroke.    Past Medical History:  Diagnosis Date   Acute CVA (cerebrovascular accident) (HCC) 08/25/2020   Hypertension    Stroke [facility])    Urinary frequency     Past Surgical History:  Procedure Laterality Date   COLONOSCOPY WITH PROPOFOL  N/A 01/31/2016   Procedure: COLONOSCOPY WITH PROPOFOL ;  Surgeon: Gladis MARLA Louder, MD;  Location: [facility];  Service: Endoscopy;  Laterality: N/A;   NO PAST SURGERIES      No family history on file.  Social History   Socioeconomic History   Marital status: Single    Spouse name: Not on file   Number of children: Not on file   Years of education: Not on file   Highest education level: Not on file  Occupational History   Not on file  Tobacco Use   Smoking status: Former    Current packs/day: 0.00    Average packs/day: 0.5 packs/day for 30.0 years (15.0 ttl pk-yrs)    Types: Cigarettes     Start date: 08/24/1990    Quit date: 08/24/2020    Years since quitting: 3.7   Smokeless tobacco: Never  Vaping Use   Vaping status: Never Used  Substance and Sexual Activity   Alcohol use: Yes    Comment: 1-2 beers most days   Drug use: No   Sexual activity: Not on file  Other Topics Concern   Not on file  Social History Narrative   Not on file   Social Drivers of Health   Financial Resource Strain: Not on file  Food Insecurity: Not on file  Transportation Needs: Not on file  Physical Activity: Not on file  Stress: Not on file  Social Connections: Unknown (11/20/2022)   Received from [facility]   Social Network    Social Network: Not on file    No Known Allergies  Outpatient Medications Prior to Visit  Medication Sig Dispense Refill   aspirin  EC (ASPIRIN  81) 81 MG tablet Take 1 tablet (81 mg total) by mouth daily. Swallow whole. 30 tablet 11   Blood Pressure Monitoring (BLOOD PRESSURE KIT) DEVI Use to measure blood pressure 1 each 0   diclofenac  Sodium (VOLTAREN ) 1 % GEL Apply 2 g topically 4 (four) times daily. To lower back 100 g 2   ezetimibe  (ZETIA ) 10 MG tablet Take 1 tablet (10 mg total) by mouth daily. 90 tablet 2   hydrocortisone  2.5 %  cream Apply topically 3 (three) times a week. Apply to affected areas of face. 30 g 3   MAGNESIUM PO Take 1 tablet by mouth daily with breakfast.     Omega-3 Fatty Acids (FISH OIL PO) Take 1 capsule by mouth daily.     tamsulosin  (FLOMAX ) 0.4 MG CAPS capsule Take 1 capsule (0.4 mg total) by mouth daily. 90 capsule 1   amLODipine  (NORVASC ) 10 MG tablet Take 1 tablet (10 mg total) by mouth daily. 90 tablet 1   atorvastatin  (LIPITOR ) 80 MG tablet Take 1 tablet (80 mg total) by mouth daily. 90 tablet 2   valsartan -hydrochlorothiazide  (DIOVAN -HCT) 320-25 MG tablet Take 1 tablet by mouth daily. 90 tablet 1   No facility-administered medications prior to visit.     ROS Review of Systems  Constitutional:  Negative for activity change  and appetite change.  HENT:  Negative for sinus pressure and sore throat.   Respiratory:  Negative for chest tightness, shortness of breath and wheezing.   Cardiovascular:  Negative for chest pain and palpitations.  Gastrointestinal:  Negative for abdominal distention, abdominal pain and constipation.  Genitourinary: Negative.   Musculoskeletal:        See HPI  Psychiatric/Behavioral:  Negative for behavioral problems and dysphoric mood.     Objective:  BP (!) 186/106   Pulse 67   Ht 5' 4 (1.626 m)   Wt 142 lb 3.2 oz (64.5 kg)   SpO2 98%   BMI 24.41 kg/m      06/09/2024   11:47 AM 06/09/2024   11:23 AM 04/21/2024   11:21 AM  BP/Weight  Systolic BP 186 202 176  Diastolic BP 106 103 113  Wt. (Lbs)  142.2   BMI  24.41 kg/m2       Physical Exam Constitutional:      Appearance: He is well-developed.  Cardiovascular:     Rate and Rhythm: Normal rate.     Heart sounds: Normal heart sounds. No murmur heard. Pulmonary:     Effort: Pulmonary effort is normal.     Breath sounds: Normal breath sounds. No wheezing or rales.  Chest:     Chest wall: No tenderness.  Abdominal:     General: Bowel sounds are normal. There is no distension.     Palpations: Abdomen is soft. There is no mass.     Tenderness: There is no abdominal tenderness.  Musculoskeletal:        General: No tenderness (no lumbar or paraspinal miscle TTP).     Right lower leg: No edema.     Left lower leg: No edema.     Comments: Negative straight leg raise bilaterally  Neurological:     Mental Status: He is alert and oriented to person, place, and time.  Psychiatric:        Mood and Affect: Mood normal.        Latest Ref Rng & Units 12/06/2023   11:39 AM 06/07/2023   11:09 AM 08/17/2022   12:12 PM  CMP  Glucose 70 - 99 mg/dL 899  889  880   BUN 8 - 27 mg/dL 27  20  17    Creatinine 0.76 - 1.27 mg/dL 8.93  9.12  9.09   Sodium 134 - 144 mmol/L 143  143  142   Potassium 3.5 - 5.2 mmol/L 4.0  3.6  3.5    Chloride 96 - 106 mmol/L 102  102  101   CO2 20 - 29 mmol/L 28  25  25   Calcium  8.6 - 10.2 mg/dL 9.9  9.5  9.5   Total Protein 6.0 - 8.5 g/dL 8.2  7.6  7.7   Total Bilirubin 0.0 - 1.2 mg/dL 0.3  <9.7  0.3   Alkaline Phos 44 - 121 IU/L 77  78  95   AST 0 - 40 IU/L 24  27  35   ALT 0 - 44 IU/L 27  32  49     Lipid Panel     Component Value Date/Time   CHOL 195 12/06/2023 1139   TRIG 99 12/06/2023 1139   HDL 75 12/06/2023 1139   CHOLHDL 3.3 06/07/2023 1109   CHOLHDL 3.6 02/19/2022 0231   VLDL 15 02/19/2022 0231   LDLCALC 103 (H) 12/06/2023 1139    CBC    Component Value Date/Time   WBC 7.2 03/06/2022 1635   WBC 6.6 02/18/2022 1424   RBC 6.09 (H) 03/06/2022 1635   RBC 6.12 (H) 02/18/2022 1424   HGB 14.4 03/06/2022 1635   HCT 45.5 03/06/2022 1635   PLT 240 03/06/2022 1635   MCV 75 (L) 03/06/2022 1635   MCH 23.6 (L) 03/06/2022 1635   MCH 24.0 (L) 02/18/2022 1424   MCHC 31.6 03/06/2022 1635   MCHC 31.6 02/18/2022 1424   RDW 15.1 03/06/2022 1635   LYMPHSABS 1.7 03/06/2022 1635   MONOABS 0.4 02/18/2022 1424   EOSABS 0.2 03/06/2022 1635   BASOSABS 0.0 03/06/2022 1635    Lab Results  Component Value Date   HGBA1C 6.0 12/06/2023       Assessment & Plan Hypertension, uncontrolled Blood pressure remains elevated despite amlodipine  and valsartan -hydrochlorothiazide . - Add spironolactone  once daily. - Advise low sodium diet. - Reassess blood pressure in one month. - Check potassium in 1 month  Sciatica, left side Left-sided low back pain radiating down the leg, consistent with nerve compression. - Prescribe tizanidine . - Refer to physical therapy. - Provide information on sciatica and recommend applying heat.  Benign prostatic hyperplasia with lower urinary tract symptoms Symptoms controlled with tamsulosin . - Order blood test to screen for prostate cancer.  Prediabetes A1c is at 6.0%.   Stroke without residual deficits Aspirin  81 mg daily and atorvastatin   are included in the management plan. - Continue aspirin  81 mg daily.     Healthcare maintenance Screening for prostate cancer-PSA ordered  Meds ordered this encounter  Medications   spironolactone  (ALDACTONE ) 25 MG tablet    Sig: Take 1 tablet (25 mg total) by mouth daily.    Dispense:  90 tablet    Refill:  1   amLODipine  (NORVASC ) 10 MG tablet    Sig: Take 1 tablet (10 mg total) by mouth daily.    Dispense:  90 tablet    Refill:  1   atorvastatin  (LIPITOR ) 80 MG tablet    Sig: Take 1 tablet (80 mg total) by mouth daily.    Dispense:  90 tablet    Refill:  1   valsartan -hydrochlorothiazide  (DIOVAN -HCT) 320-25 MG tablet    Sig: Take 1 tablet by mouth daily.    Dispense:  90 tablet    Refill:  1   tiZANidine  (ZANAFLEX ) 4 MG tablet    Sig: Take 1 tablet (4 mg total) by mouth every 8 (eight) hours as needed.    Dispense:  60 tablet    Refill:  1    Follow-up: Return in about 1 month (around 07/09/2024) for Blood Pressure follow-up with PCP.       Nivea Wojdyla  Guy Toney, MD, FAAFP. [facility] [place], [place] [phone]   06/09/2024, 11:58 AM

## 2024-06-09 NOTE — Patient Instructions (Signed)
 VISIT SUMMARY:  Today, we discussed your elevated blood pressure, low back pain, and reviewed your current medications and conditions. We made some adjustments to your treatment plan to better manage your health.  YOUR PLAN:  -HYPERTENSION, UNCONTROLLED: Your blood pressure is still high despite your current medications. We are adding spironolactone  to your regimen and advising you to follow a low sodium diet. We will reassess your blood pressure in one month.  -SCIATICA, LEFT SIDE: Your low back pain radiating down your leg is likely due to nerve compression. We are prescribing tizanidine  for relief and referring you to physical therapy. Applying heat to the affected area may also help.  -BENIGN PROSTATIC HYPERPLASIA WITH LOWER URINARY TRACT SYMPTOMS: Your symptoms from an enlarged prostate are well-controlled with tamsulosin . We are ordering a blood test to screen for prostate cancer as a precaution.  -PREDIABETES: Your blood sugar levels are in the prediabetes range, but your A1c is stable at 6.0%. No changes to your current management are needed.  -HYPERLIPIDEMIA: Your cholesterol levels are being managed with atorvastatin . We will refill your prescription to continue this treatment.  -STROKE WITHOUT RESIDUAL DEFICITS: You have a history of stroke but no lasting effects. Continue taking aspirin  81 mg daily as part of your management plan.  INSTRUCTIONS:  Please follow up in one month to reassess your blood pressure. Additionally, attend physical therapy sessions as referred for your sciatica. Ensure you get the blood test for prostate cancer screening as ordered.

## 2024-06-10 ENCOUNTER — Ambulatory Visit: Payer: Self-pay | Admitting: Family Medicine

## 2024-06-10 LAB — CMP14+EGFR
ALT: 40 IU/L (ref 0–44)
AST: 45 IU/L — ABNORMAL HIGH (ref 0–40)
Albumin: 4.4 g/dL (ref 3.9–4.9)
Alkaline Phosphatase: 78 IU/L (ref 47–123)
BUN/Creatinine Ratio: 12 (ref 10–24)
BUN: 9 mg/dL (ref 8–27)
Bilirubin Total: 0.4 mg/dL (ref 0.0–1.2)
CO2: 27 mmol/L (ref 20–29)
Calcium: 9.3 mg/dL (ref 8.6–10.2)
Chloride: 102 mmol/L (ref 96–106)
Creatinine, Ser: 0.76 mg/dL (ref 0.76–1.27)
Globulin, Total: 3 g/dL (ref 1.5–4.5)
Glucose: 86 mg/dL (ref 70–99)
Potassium: 4.1 mmol/L (ref 3.5–5.2)
Sodium: 140 mmol/L (ref 134–144)
Total Protein: 7.4 g/dL (ref 6.0–8.5)
eGFR: 100 mL/min/1.73 (ref >59)

## 2024-06-10 LAB — PSA, TOTAL AND FREE
PSA, Free Pct: 19.1 %
PSA, Free: 0.61 ng/mL
Prostate Specific Ag, Serum: 3.2 ng/mL (ref 0.0–4.0)

## 2024-06-11 ENCOUNTER — Other Ambulatory Visit: Payer: Self-pay

## 2024-07-09 ENCOUNTER — Encounter: Payer: Self-pay | Admitting: Family Medicine

## 2024-07-09 ENCOUNTER — Other Ambulatory Visit: Payer: Self-pay

## 2024-07-09 ENCOUNTER — Ambulatory Visit: Payer: PRIVATE HEALTH INSURANCE | Attending: Family Medicine | Admitting: Family Medicine

## 2024-07-09 VITALS — BP 160/93 | HR 66 | Temp 97.8°F | Ht 64.0 in | Wt 143.8 lb

## 2024-07-09 DIAGNOSIS — I10 Essential (primary) hypertension: Secondary | ICD-10-CM

## 2024-07-09 MED ORDER — SPIRONOLACTONE 50 MG PO TABS
50.0000 mg | ORAL_TABLET | Freq: Every day | ORAL | 1 refills | Status: AC
  Filled 2024-07-09 – 2024-08-15 (×2): qty 90, 90d supply, fill #0

## 2024-07-09 NOTE — Patient Instructions (Addendum)
 Please call Physical therapy for your appointment as they have been trying to reach you: Placed in [facility]  [address] [place], [place]  [address] Ph# [phone]

## 2024-07-09 NOTE — Progress Notes (Signed)
 Subjective:  Patient ID: Paul [name], male    DOB: 1961-04-09  Age: 63 y.o. MRN: [id number]  CC: Hypertension     Discussed the use of AI scribe software for clinical note transcription with the patient, who gave verbal consent to proceed.  History of Present Illness Paul [name] is a 63 year old male with a history of CVA, hypertension, hyperlipidemia, prediabetes, and benign prostatic hyperplasia (BPH) who presents with elevated blood pressure.  His blood pressure was high during his last visit and remains elevated today. He took his medication this morning at 7:40 AM. He is currently taking amlodipine  and valsartan /hydrochlorothiazide . There was confusion about his medications as he had stated one of his blood pressure pills cause drowsiness and he discontinued it.  Spironolactone  was the most recent antihypertensive added at his last visit.  The pharmacy verifies that he picked up spironolactone .  On further questioning, he mistakenly thought tizanidine  was for his blood pressure, which caused drowsiness so he actually discontinued it. He experiences headaches sometimes, but not consistently, even when his blood pressure is high. No shortness of breath. He had back pain at his last visit which he stated has resolved.  I had referred him for PT but he never heard from them.  His chart reveals that they had called him to set up an appointment but could not reach him.    Past Medical History:  Diagnosis Date   Acute CVA (cerebrovascular accident) (HCC) 08/25/2020   Hypertension    Stroke [facility])    Urinary frequency     Past Surgical History:  Procedure Laterality Date   COLONOSCOPY WITH PROPOFOL  N/A 01/31/2016   Procedure: COLONOSCOPY WITH PROPOFOL ;  Surgeon: Paul Paul Louder, MD;  Location: [facility];  Service: Endoscopy;  Laterality: N/A;   NO PAST SURGERIES      No family history on file.  Social History   Socioeconomic History   Marital status: Single    Spouse name: Not  on file   Number of children: Not on file   Years of education: Not on file   Highest education level: Not on file  Occupational History   Not on file  Tobacco Use   Smoking status: Former    Current packs/day: 0.00    Average packs/day: 0.5 packs/day for 30.0 years (15.0 ttl pk-yrs)    Types: Cigarettes    Start date: 08/24/1990    Quit date: 08/24/2020    Years since quitting: 3.8   Smokeless tobacco: Never  Vaping Use   Vaping status: Never Used  Substance and Sexual Activity   Alcohol use: Yes    Comment: 1-2 beers most days   Drug use: No   Sexual activity: Not on file  Other Topics Concern   Not on file  Social History Narrative   Not on file   Social Drivers of Health   Financial Resource Strain: Not on file  Food Insecurity: Not on file  Transportation Needs: Not on file  Physical Activity: Not on file  Stress: Not on file  Social Connections: Unknown (11/20/2022)   Received from [facility]   Social Network    Social Network: Not on file    No Known Allergies  Outpatient Medications Prior to Visit  Medication Sig Dispense Refill   amLODipine  (NORVASC ) 10 MG tablet Take 1 tablet (10 mg total) by mouth daily. 90 tablet 1   aspirin  EC (ASPIRIN  81) 81 MG tablet Take 1 tablet (81 mg total) by mouth daily.  Swallow whole. 30 tablet 11   atorvastatin  (LIPITOR ) 80 MG tablet Take 1 tablet (80 mg total) by mouth daily. 90 tablet 1   Blood Pressure Monitoring (BLOOD PRESSURE KIT) DEVI Use to measure blood pressure 1 each 0   diclofenac  Sodium (VOLTAREN ) 1 % GEL Apply 2 g topically 4 (four) times daily. To lower back 100 g 2   ezetimibe  (ZETIA ) 10 MG tablet Take 1 tablet (10 mg total) by mouth daily. 90 tablet 2   hydrocortisone  2.5 % cream Apply topically 3 (three) times a week. Apply to affected areas of face. 30 g 3   MAGNESIUM PO Take 1 tablet by mouth daily with breakfast.     Omega-3 Fatty Acids (FISH OIL PO) Take 1 capsule by mouth daily.     tamsulosin  (FLOMAX )  0.4 MG CAPS capsule Take 1 capsule (0.4 mg total) by mouth daily. 90 capsule 1   valsartan -hydrochlorothiazide  (DIOVAN -HCT) 320-25 MG tablet Take 1 tablet by mouth daily. 90 tablet 1   spironolactone  (ALDACTONE ) 25 MG tablet Take 1 tablet (25 mg total) by mouth daily. 90 tablet 1   tiZANidine  (ZANAFLEX ) 4 MG tablet Take 1 tablet (4 mg total) by mouth every 8 (eight) hours as needed. (Patient not taking: Reported on 07/09/2024) 60 tablet 1   No facility-administered medications prior to visit.     ROS Review of Systems  Constitutional:  Negative for activity change and appetite change.  HENT:  Negative for sinus pressure and sore throat.   Respiratory:  Negative for chest tightness, shortness of breath and wheezing.   Cardiovascular:  Negative for chest pain and palpitations.  Gastrointestinal:  Negative for abdominal distention, abdominal pain and constipation.  Genitourinary: Negative.   Musculoskeletal: Negative.   Psychiatric/Behavioral:  Negative for behavioral problems and dysphoric mood.     Objective:  BP (!) 160/93   Pulse 66   Temp 97.8 F (36.6 C) (Oral)   Ht 5' 4 (1.626 m)   Wt 143 lb 12.8 oz (65.2 kg)   SpO2 99%   BMI 24.68 kg/m      07/09/2024   11:03 AM 07/09/2024   10:32 AM 06/09/2024   11:47 AM  BP/Weight  Systolic BP 160 188 186  Diastolic BP 93 110 106  Wt. (Lbs)  143.8   BMI  24.68 kg/m2       Physical Exam Constitutional:      Appearance: He is well-developed.  Cardiovascular:     Rate and Rhythm: Normal rate.     Heart sounds: Normal heart sounds. No murmur heard. Pulmonary:     Effort: Pulmonary effort is normal.     Breath sounds: Normal breath sounds. No wheezing or rales.  Chest:     Chest wall: No tenderness.  Abdominal:     General: Bowel sounds are normal. There is no distension.     Palpations: Abdomen is soft. There is no mass.     Tenderness: There is no abdominal tenderness.  Musculoskeletal:        General: Normal range of  motion.     Right lower leg: No edema.     Left lower leg: No edema.  Neurological:     Mental Status: He is alert and oriented to person, place, and time.  Psychiatric:        Mood and Affect: Mood normal.        Latest Ref Rng & Units 06/09/2024   12:18 PM 12/06/2023   11:39 AM 06/07/2023  11:09 AM  CMP  Glucose 70 - 99 mg/dL 86  899  889   BUN 8 - 27 mg/dL 9  27  20    Creatinine 0.76 - 1.27 mg/dL 9.23  8.93  9.12   Sodium 134 - 144 mmol/L 140  143  143   Potassium 3.5 - 5.2 mmol/L 4.1  4.0  3.6   Chloride 96 - 106 mmol/L 102  102  102   CO2 20 - 29 mmol/L 27  28  25    Calcium  8.6 - 10.2 mg/dL 9.3  9.9  9.5   Total Protein 6.0 - 8.5 g/dL 7.4  8.2  7.6   Total Bilirubin 0.0 - 1.2 mg/dL 0.4  0.3  <9.7   Alkaline Phos 47 - 123 IU/L 78  77  78   AST 0 - 40 IU/L 45  24  27   ALT 0 - 44 IU/L 40  27  32     Lipid Panel     Component Value Date/Time   CHOL 195 12/06/2023 1139   TRIG 99 12/06/2023 1139   HDL 75 12/06/2023 1139   CHOLHDL 3.3 06/07/2023 1109   CHOLHDL 3.6 02/19/2022 0231   VLDL 15 02/19/2022 0231   LDLCALC 103 (H) 12/06/2023 1139    CBC    Component Value Date/Time   WBC 7.2 03/06/2022 1635   WBC 6.6 02/18/2022 1424   RBC 6.09 (H) 03/06/2022 1635   RBC 6.12 (H) 02/18/2022 1424   HGB 14.4 03/06/2022 1635   HCT 45.5 03/06/2022 1635   PLT 240 03/06/2022 1635   MCV 75 (L) 03/06/2022 1635   MCH 23.6 (L) 03/06/2022 1635   MCH 24.0 (L) 02/18/2022 1424   MCHC 31.6 03/06/2022 1635   MCHC 31.6 02/18/2022 1424   RDW 15.1 03/06/2022 1635   LYMPHSABS 1.7 03/06/2022 1635   MONOABS 0.4 02/18/2022 1424   EOSABS 0.2 03/06/2022 1635   BASOSABS 0.0 03/06/2022 1635    Lab Results  Component Value Date   HGBA1C 6.0 12/06/2023       Assessment & Plan Hypertension Hypertension uncontrolled  -He was confused with regards to spironolactone  and tizanidine  and Nazim tizanidine  was an antihypertensive which made him drowsy - Discontinue tizanidine . -Pharmacy  verified he picked up spironolactone  prescriptions so I will assume he has been taking it - Increase spironolactone  to 50 mg daily using two 25 mg tablets until current supply is exhausted, then switch to 50 mg tablet. - Re-evaluate blood pressure control in one month. -Counseled on blood pressure goal of less than 130/80, low-sodium, DASH diet, medication compliance, 150 minutes of moderate intensity exercise per week. Discussed medication compliance, adverse effects.       Meds ordered this encounter  Medications   spironolactone  (ALDACTONE ) 50 MG tablet    Sig: Take 1 tablet (50 mg total) by mouth daily.    Dispense:  90 tablet    Refill:  1    Dose increase    Follow-up: Return in about 1 month (around 08/09/2024) for Blood Pressure follow-up with PCP.       Corrina Sabin, MD, FAAFP. [facility] [place], [place] [phone]   07/09/2024, 12:35 PM

## 2024-07-18 ENCOUNTER — Other Ambulatory Visit: Payer: Self-pay

## 2024-08-15 ENCOUNTER — Other Ambulatory Visit: Payer: Self-pay

## 2024-08-18 ENCOUNTER — Ambulatory Visit: Payer: PRIVATE HEALTH INSURANCE | Admitting: Family Medicine

## 2024-10-07 ENCOUNTER — Telehealth: Payer: Self-pay | Admitting: Family Medicine

## 2024-10-07 NOTE — Telephone Encounter (Signed)
 Contacted pt to confirmed appt mailbox not setup to leave a message to confirmed appt

## 2024-10-08 ENCOUNTER — Ambulatory Visit: Payer: PRIVATE HEALTH INSURANCE | Admitting: Family Medicine

## 2024-11-27 ENCOUNTER — Ambulatory Visit: Admitting: Family Medicine
